# Patient Record
Sex: Female | Born: 1970
Health system: Southern US, Community
[De-identification: ages and names within clinical notes are randomized; demographics above are authoritative.]

## PROBLEM LIST (undated history)

## (undated) DIAGNOSIS — N938 Other specified abnormal uterine and vaginal bleeding: Secondary | ICD-10-CM

## (undated) DIAGNOSIS — IMO0002 Reserved for concepts with insufficient information to code with codable children: Secondary | ICD-10-CM

## (undated) DIAGNOSIS — Z8489 Family history of other specified conditions: Secondary | ICD-10-CM

## (undated) DIAGNOSIS — D649 Anemia, unspecified: Secondary | ICD-10-CM

## (undated) DIAGNOSIS — N200 Calculus of kidney: Secondary | ICD-10-CM

## (undated) HISTORY — PX: LITHOTRIPSY: SUR834

## (undated) HISTORY — DX: Calculus of kidney: N20.0

## (undated) HISTORY — DX: Other specified abnormal uterine and vaginal bleeding: N93.8

## (undated) HISTORY — DX: Reserved for concepts with insufficient information to code with codable children: IMO0002

---

## 1999-08-30 DIAGNOSIS — IMO0002 Reserved for concepts with insufficient information to code with codable children: Secondary | ICD-10-CM

## 1999-08-30 DIAGNOSIS — R87619 Unspecified abnormal cytological findings in specimens from cervix uteri: Secondary | ICD-10-CM

## 1999-08-30 HISTORY — DX: Unspecified abnormal cytological findings in specimens from cervix uteri: R87.619

## 1999-08-30 HISTORY — DX: Reserved for concepts with insufficient information to code with codable children: IMO0002

## 1999-10-15 ENCOUNTER — Other Ambulatory Visit: Admission: RE | Admit: 1999-10-15 | Discharge: 1999-10-15 | Payer: Self-pay | Admitting: Obstetrics & Gynecology

## 2000-03-16 ENCOUNTER — Ambulatory Visit (HOSPITAL_COMMUNITY): Admission: RE | Admit: 2000-03-16 | Discharge: 2000-03-16 | Payer: Self-pay | Admitting: Obstetrics and Gynecology

## 2000-04-05 ENCOUNTER — Other Ambulatory Visit: Admission: RE | Admit: 2000-04-05 | Discharge: 2000-04-05 | Payer: Self-pay | Admitting: Gynecology

## 2000-04-25 ENCOUNTER — Inpatient Hospital Stay (HOSPITAL_COMMUNITY): Admission: AD | Admit: 2000-04-25 | Discharge: 2000-04-30 | Payer: Self-pay | Admitting: Obstetrics and Gynecology

## 2000-04-25 ENCOUNTER — Encounter (INDEPENDENT_AMBULATORY_CARE_PROVIDER_SITE_OTHER): Payer: Self-pay | Admitting: Specialist

## 2000-04-27 ENCOUNTER — Encounter: Payer: Self-pay | Admitting: Obstetrics & Gynecology

## 2000-05-01 ENCOUNTER — Encounter: Admission: RE | Admit: 2000-05-01 | Discharge: 2000-07-30 | Payer: Self-pay | Admitting: Obstetrics and Gynecology

## 2000-05-29 ENCOUNTER — Other Ambulatory Visit: Admission: RE | Admit: 2000-05-29 | Discharge: 2000-05-29 | Payer: Self-pay | Admitting: Obstetrics & Gynecology

## 2000-07-31 ENCOUNTER — Encounter: Admission: RE | Admit: 2000-07-31 | Discharge: 2000-10-29 | Payer: Self-pay | Admitting: Obstetrics and Gynecology

## 2000-11-29 ENCOUNTER — Encounter: Admission: RE | Admit: 2000-11-29 | Discharge: 2000-12-26 | Payer: Self-pay | Admitting: Obstetrics and Gynecology

## 2005-11-01 ENCOUNTER — Other Ambulatory Visit: Admission: RE | Admit: 2005-11-01 | Discharge: 2005-11-01 | Payer: Self-pay | Admitting: *Deleted

## 2008-11-12 ENCOUNTER — Ambulatory Visit (HOSPITAL_COMMUNITY): Admission: RE | Admit: 2008-11-12 | Discharge: 2008-11-12 | Payer: Self-pay | Admitting: Emergency Medicine

## 2008-11-19 ENCOUNTER — Ambulatory Visit (HOSPITAL_COMMUNITY): Admission: RE | Admit: 2008-11-19 | Discharge: 2008-11-19 | Payer: Self-pay | Admitting: Urology

## 2009-07-01 ENCOUNTER — Emergency Department (HOSPITAL_COMMUNITY): Admission: EM | Admit: 2009-07-01 | Discharge: 2009-07-01 | Payer: Self-pay | Admitting: Emergency Medicine

## 2009-07-10 ENCOUNTER — Ambulatory Visit (HOSPITAL_COMMUNITY): Admission: RE | Admit: 2009-07-10 | Discharge: 2009-07-10 | Payer: Self-pay | Admitting: Urology

## 2010-12-01 LAB — URINALYSIS, ROUTINE W REFLEX MICROSCOPIC
Glucose, UA: NEGATIVE mg/dL
Hgb urine dipstick: NEGATIVE
Ketones, ur: 15 mg/dL — AB
Nitrite: NEGATIVE
Nitrite: NEGATIVE
Protein, ur: NEGATIVE mg/dL
Specific Gravity, Urine: 1.034 — ABNORMAL HIGH (ref 1.005–1.030)
Specific Gravity, Urine: 1.035 — ABNORMAL HIGH (ref 1.005–1.030)
Urobilinogen, UA: 1 mg/dL (ref 0.0–1.0)
pH: 5.5 (ref 5.0–8.0)
pH: 5.5 (ref 5.0–8.0)

## 2010-12-01 LAB — URINE CULTURE

## 2010-12-01 LAB — URINE MICROSCOPIC-ADD ON

## 2010-12-09 LAB — PREGNANCY, URINE: Preg Test, Ur: NEGATIVE

## 2011-01-11 NOTE — Op Note (Signed)
NAMESTEVIE, ERTLE NO.:  [id number]   MEDICAL RECORD NO.:  [id number]          PATIENT TYPE:  AMB   LOCATION:  DAY                          FACILITY:  [facility]   PHYSICIAN:  Heloise Purpura, MD      DATE OF BIRTH:  [birth year]   DATE OF PROCEDURE:  11/19/2008  DATE OF DISCHARGE:                               OPERATIVE REPORT   PREOPERATIVE DIAGNOSIS:  Left renal calculus.   POSTOPERATIVE DIAGNOSIS:  Left renal calculus.   PROCEDURES:  1. Cystoscopy.  2. Left retrograde pyelography.  3. Left ureteroscopy with laser lithotripsy.  4. Left ureteral stent placement.   SURGEON:  Dr. Heloise Purpura.   ANESTHESIA:  General.   COMPLICATIONS:  None.   INDICATIONS:  Ms. Persico is a 40 year old female who recently presented  with complaints of abdominal and back pain.  She was found to have a  large 1.7 cm calculus at the left ureteropelvic junction.  After  discussion regarding management options for treatment, she elected to  proceed with the above procedures.  The potential risks, complications,  and alternative treatment options were discussed in detail and informed  consent was obtained.   DESCRIPTION OF PROCEDURE:  The patient was taken to the operating room  and a general anesthetic was administered.  She was given preoperative  antibiotics, placed in the dorsal lithotomy position, and prepped and  draped in the usual sterile fashion.  Next, cystourethroscopy was  performed which demonstrated a normal urethra.  There was noted be  squamous metaplasia along the trigone of the bladder.  The ureteral  orifices were noted be in their normal anatomic position and effluxing  clear urine.  There was no evidence for any bladder tumors, stones, or  other mucosal pathology.  Attention then turned to the left ureteral  orifice which was cannulated with a 6-French ureteral catheter.  Omnipaque contrast was injected and the ureter appeared to be of normal  caliber without  evidence of filling defects or obvious calculi.  The  patient was noted to have a large calculus in the left renal pelvis  consistent with her known stone.  A 0.038 Sensor guidewire was then  advanced up into the left renal pelvis under fluoroscopic guidance.  A  12/14 ureteral access sheath was then advanced over the wire allowing  dilation of the distal ureter and placement of the ureteral access  sheath up in the proximal ureter.  The flexible ureteroscope was then  advanced up into the proximal ureter and the renal calculus was  identified.  The 200 micron fiber was then used to perform fragmentation  of the stone with the holmium laser.  Once the stone was adequately  fragmented, multiple fragments were removed with the engage basket.  The  guidewire was then replaced under direct vision through the ureteroscope  into the upper pole of the renal pelvis and the ureteral access sheath  was removed.  The wire was back loaded over the cystoscope and a 6 x 24  double-J ureteral stent was advanced over the wire using Seldinger  technique and positioned under fluoroscopic and  cystoscopic guidance.  The wire was removed with a good curl  noted in the renal pelvis, as well as in the bladder.  The bladder was  then emptied and the procedure was ended.  She tolerated the procedure  well without complications.  She was able to be extubated and  transferred to the recovery unit in satisfactory condition.      Heloise Purpura, MD  Electronically Signed     LB/MEDQ  D:  11/19/2008  T:  11/19/2008  Job:  [id number]

## 2012-04-15 ENCOUNTER — Emergency Department (HOSPITAL_COMMUNITY)
Admission: EM | Admit: 2012-04-15 | Discharge: 2012-04-15 | Disposition: A | Discharge status: Home / Self Care | Payer: Self-pay | Attending: Emergency Medicine | Admitting: Emergency Medicine

## 2012-04-15 ENCOUNTER — Emergency Department (HOSPITAL_COMMUNITY): Payer: Self-pay

## 2012-04-15 ENCOUNTER — Encounter (HOSPITAL_COMMUNITY): Payer: Self-pay | Admitting: *Deleted

## 2012-04-15 DIAGNOSIS — N200 Calculus of kidney: Secondary | ICD-10-CM | POA: Insufficient documentation

## 2012-04-15 DIAGNOSIS — R109 Unspecified abdominal pain: Secondary | ICD-10-CM | POA: Insufficient documentation

## 2012-04-15 DIAGNOSIS — F17200 Nicotine dependence, unspecified, uncomplicated: Secondary | ICD-10-CM | POA: Insufficient documentation

## 2012-04-15 DIAGNOSIS — Z87442 Personal history of urinary calculi: Secondary | ICD-10-CM | POA: Insufficient documentation

## 2012-04-15 DIAGNOSIS — R300 Dysuria: Secondary | ICD-10-CM | POA: Insufficient documentation

## 2012-04-15 DIAGNOSIS — N390 Urinary tract infection, site not specified: Secondary | ICD-10-CM | POA: Insufficient documentation

## 2012-04-15 LAB — URINE MICROSCOPIC-ADD ON

## 2012-04-15 LAB — URINALYSIS, ROUTINE W REFLEX MICROSCOPIC
Glucose, UA: NEGATIVE mg/dL
Hgb urine dipstick: NEGATIVE
Specific Gravity, Urine: 1.018 (ref 1.005–1.030)
pH: 6 (ref 5.0–8.0)

## 2012-04-15 LAB — GLUCOSE, CAPILLARY: Glucose-Capillary: 78 mg/dL (ref 70–99)

## 2012-04-15 LAB — POCT PREGNANCY, URINE: Preg Test, Ur: NEGATIVE

## 2012-04-15 MED ORDER — CIPROFLOXACIN HCL 500 MG PO TABS: 500.0000 mg | ORAL_TABLET | Freq: Two times a day (BID) | ORAL | Status: AC

## 2012-04-15 NOTE — ED Provider Notes (Signed)
History     CSN: [id number]  Arrival date & time 04/15/12  1210   First MD Initiated Contact with Patient 04/15/12 1401      Chief Complaint  Patient presents with  . Polyuria  . Dysuria  . Back Pain    (Consider location/radiation/quality/duration/timing/severity/associated sxs/prior treatment) Patient is a 41 y.o. female presenting with dysuria and back pain. The history is provided by the patient.  Dysuria   Back Pain  Associated symptoms include dysuria.   patient here complaining of a one-month history of right-sided flank pain with some dysuria. Denies any fever, vomiting, chills. Symptoms have been colicky. History of kidney stones in the past. No vaginal bleeding or discharge. Nothing makes her symptoms better or worse and no medications used prior to arrival  History reviewed. No pertinent past medical history.  History reviewed. No pertinent past surgical history.  History reviewed. No pertinent family history.  History  Substance Use Topics  . Smoking status: Current Everyday Smoker  . Smokeless tobacco: Not on file  . Alcohol Use: No    OB History    Grav Para Term Preterm Abortions TAB SAB Ect Mult Living                  Review of Systems  Genitourinary: Positive for dysuria.  Musculoskeletal: Positive for back pain.  All other systems reviewed and are negative.    Allergies  Review of patient's allergies indicates not on file.  Home Medications  No current outpatient prescriptions on file.  BP 132/90  Pulse 93  Temp 98.5 F (36.9 C) (Oral)  Resp 16  SpO2 99%  Physical Exam  Nursing note and vitals reviewed. Constitutional: She is oriented to person, place, and time. She appears well-developed and well-nourished.  Non-toxic appearance. No distress.  HENT:  Head: Normocephalic and atraumatic.  Eyes: Conjunctivae, EOM and lids are normal. Pupils are equal, round, and reactive to light.  Neck: Normal range of motion. Neck supple. No  tracheal deviation present. No mass present.  Cardiovascular: Normal rate, regular rhythm and normal heart sounds.  Exam reveals no gallop.   No murmur heard. Pulmonary/Chest: Effort normal and breath sounds normal. No stridor. No respiratory distress. She has no decreased breath sounds. She has no wheezes. She has no rhonchi. She has no rales.  Abdominal: Soft. Normal appearance and bowel sounds are normal. She exhibits no distension. There is no tenderness. There is no rigidity, no rebound, no guarding and no CVA tenderness.  Musculoskeletal: Normal range of motion. She exhibits no edema and no tenderness.  Neurological: She is alert and oriented to person, place, and time. She has normal strength. No cranial nerve deficit or sensory deficit. GCS eye subscore is 4. GCS verbal subscore is 5. GCS motor subscore is 6.  Skin: Skin is warm and dry. No abrasion and no rash noted.  Psychiatric: She has a normal mood and affect. Her speech is normal and behavior is normal.    ED Course  Procedures (including critical care time)  Labs Reviewed  URINALYSIS, ROUTINE W REFLEX MICROSCOPIC - Abnormal; Notable for the following:    APPearance CLOUDY (*)     Nitrite POSITIVE (*)     Leukocytes, UA MODERATE (*)     All other components within normal limits  URINE MICROSCOPIC-ADD ON - Abnormal; Notable for the following:    Squamous Epithelial / LPF FEW (*)     Bacteria, UA MANY (*)     All other components  within normal limits  POCT PREGNANCY, URINE   No results found.   No diagnosis found.    MDM  Patient's CAT scan shows intrarenal stones without ureteral stones. Patient be treated for her UTI        Toy Baker, MD 04/15/12 1510

## 2012-04-15 NOTE — ED Notes (Signed)
Pt's CBG was 78 when I checked it 2:14 pm JG.

## 2012-04-15 NOTE — ED Notes (Signed)
Pt reports month hx of lower back pain, polyuria, cloudy urine and dysuria.

## 2012-12-30 ENCOUNTER — Emergency Department (HOSPITAL_COMMUNITY)
Admission: EM | Admit: 2012-12-30 | Discharge: 2012-12-30 | Disposition: A | Discharge status: Home / Self Care | Payer: Medicaid Other | Attending: Emergency Medicine | Admitting: Emergency Medicine

## 2012-12-30 ENCOUNTER — Encounter (HOSPITAL_COMMUNITY): Payer: Self-pay | Admitting: Emergency Medicine

## 2012-12-30 DIAGNOSIS — N939 Abnormal uterine and vaginal bleeding, unspecified: Secondary | ICD-10-CM

## 2012-12-30 DIAGNOSIS — R109 Unspecified abdominal pain: Secondary | ICD-10-CM | POA: Insufficient documentation

## 2012-12-30 DIAGNOSIS — Z3202 Encounter for pregnancy test, result negative: Secondary | ICD-10-CM | POA: Insufficient documentation

## 2012-12-30 DIAGNOSIS — F17200 Nicotine dependence, unspecified, uncomplicated: Secondary | ICD-10-CM | POA: Insufficient documentation

## 2012-12-30 DIAGNOSIS — N898 Other specified noninflammatory disorders of vagina: Secondary | ICD-10-CM | POA: Insufficient documentation

## 2012-12-30 DIAGNOSIS — N390 Urinary tract infection, site not specified: Secondary | ICD-10-CM | POA: Insufficient documentation

## 2012-12-30 LAB — URINALYSIS, ROUTINE W REFLEX MICROSCOPIC
Ketones, ur: NEGATIVE mg/dL
Protein, ur: NEGATIVE mg/dL
Urobilinogen, UA: 0.2 mg/dL (ref 0.0–1.0)

## 2012-12-30 LAB — BASIC METABOLIC PANEL
CO2: 26 mEq/L (ref 19–32)
GFR calc non Af Amer: 90 mL/min — ABNORMAL LOW (ref >90)
Glucose, Bld: 86 mg/dL (ref 70–99)
Potassium: 4.2 mEq/L (ref 3.5–5.1)
Sodium: 139 mEq/L (ref 135–145)

## 2012-12-30 LAB — URINE MICROSCOPIC-ADD ON

## 2012-12-30 LAB — PREGNANCY, URINE: Preg Test, Ur: NEGATIVE

## 2012-12-30 LAB — WET PREP, GENITAL

## 2012-12-30 LAB — CBC
MCHC: 35.8 g/dL (ref 30.0–36.0)
RDW: 13 % (ref 11.5–15.5)

## 2012-12-30 MED ORDER — MEDROXYPROGESTERONE ACETATE 10 MG PO TABS: 10.0000 mg | ORAL_TABLET | Freq: Every day | ORAL | Status: DC

## 2012-12-30 MED ORDER — SODIUM CHLORIDE 0.9 % IV BOLUS (SEPSIS)
1000.0000 mL | Freq: Once | INTRAVENOUS | Status: AC
  Administered 2012-12-30: 1000 mL via INTRAVENOUS

## 2012-12-30 MED ORDER — SULFAMETHOXAZOLE-TRIMETHOPRIM 800-160 MG PO TABS: 1.0000 | ORAL_TABLET | Freq: Two times a day (BID) | ORAL | Status: AC

## 2012-12-30 NOTE — ED Notes (Signed)
Pt dc'd home w/all belongings, alert and ambulatory upon dc, pt verbalizes understanding of dc instructions, 2 new rx prescribed

## 2012-12-30 NOTE — ED Provider Notes (Signed)
History     CSN: [id number]  Arrival date & time 12/30/12  1137   First MD Initiated Contact with Patient 12/30/12 1203      Chief Complaint  Patient presents with  . Vaginal Bleeding    (Consider location/radiation/quality/duration/timing/severity/associated sxs/prior treatment) Patient is a 42 y.o. female presenting with vaginal bleeding.  Vaginal Bleeding   Pt reports persistent vaginal bleeding for the last 7 weeks, heavy at times with clots. Minimal associated lower abdominal cramping, does not think she is pregnant. She has had general weakness and occasional lightheadedness. She denies any vomiting or fever. No dysuria. She has not had regular Gyn follow up since 2001 when she had an abnormal pap smear during pregnancy. Did not get follow up for that.   History reviewed. No pertinent past medical history.  History reviewed. No pertinent past surgical history.  History reviewed. No pertinent family history.  History  Substance Use Topics  . Smoking status: Current Every Day Smoker  . Smokeless tobacco: Not on file  . Alcohol Use: No    OB History   Grav Para Term Preterm Abortions TAB SAB Ect Mult Living                  Review of Systems  Genitourinary: Positive for vaginal bleeding.   All other systems reviewed and are negative except as noted in HPI.   Allergies  Review of patient's allergies indicates no known allergies.  Home Medications   Current Outpatient Rx  Name  Route  Sig  Dispense  Refill  . ibuprofen (ADVIL,MOTRIN) 200 MG tablet   Oral   Take 200 mg by mouth every 6 (six) hours as needed. For pain           BP 145/98  Pulse 89  Temp(Src) 97.6 F (36.4 C) (Oral)  Resp 18  SpO2 97%  Physical Exam  Nursing note and vitals reviewed. Constitutional: She is oriented to person, place, and time. She appears well-developed and well-nourished.  HENT:  Head: Normocephalic and atraumatic.  Eyes: EOM are normal. Pupils are equal, round,  and reactive to light.  Neck: Normal range of motion. Neck supple.  Cardiovascular: Normal rate, normal heart sounds and intact distal pulses.   Pulmonary/Chest: Effort normal and breath sounds normal.  Abdominal: Bowel sounds are normal. She exhibits no distension. There is tenderness (bilateral lower abdominal pain). There is no rebound and no guarding.  Genitourinary: Cervix exhibits no motion tenderness, no discharge and no friability. Right adnexum displays tenderness. Right adnexum displays no mass. Left adnexum displays tenderness. Left adnexum displays no mass. There is bleeding around the vagina. No vaginal discharge found.  Musculoskeletal: Normal range of motion. She exhibits no edema and no tenderness.  Neurological: She is alert and oriented to person, place, and time. She has normal strength. No cranial nerve deficit or sensory deficit.  Skin: Skin is warm and dry. No rash noted.  Psychiatric: She has a normal mood and affect.    ED Course  Procedures (including critical care time)  Labs Reviewed  WET PREP, GENITAL - Abnormal; Notable for the following:    WBC, Wet Prep HPF POC FEW (*)    All other components within normal limits  CBC - Abnormal; Notable for the following:    Hemoglobin 15.7 (*)    MCH 34.1 (*)    All other components within normal limits  BASIC METABOLIC PANEL - Abnormal; Notable for the following:    GFR calc non Af  Amer 90 (*)    All other components within normal limits  GC/CHLAMYDIA PROBE AMP  PREGNANCY, URINE  URINALYSIS, ROUTINE W REFLEX MICROSCOPIC   No results found.   1. Abnormal vaginal bleeding       MDM  Preg negative, no anemia, vitals are normal. Will start Provera for DUB, advised follow up with Gyn at [facility] for further evaluation.        Shamariah Shewmake B. Bernette Mayers, MD 12/30/12 1335

## 2012-12-30 NOTE — ED Notes (Signed)
Pt sts vaginal bleeding x 2 months; pt denies pain

## 2012-12-30 NOTE — ED Notes (Signed)
Pt reports vaginal bleeding x2 months, pt states "some days are light and then some days I have heavy blood clots." Pt also c/o weakness, dizziness, and "rapid" weight gain. Pt weighed approx 120 lbs last Oct/Nov and now weighs 170 lbs. Pt is unable to follow up with an OBGYN d.t no insurance.

## 2012-12-31 LAB — GC/CHLAMYDIA PROBE AMP: GC Probe RNA: NEGATIVE

## 2013-01-01 LAB — URINE CULTURE: Colony Count: >=100000

## 2013-01-02 NOTE — ED Notes (Signed)
Post ED Visit - Positive Culture Follow-up  Culture report reviewed by antimicrobial stewardship pharmacist: []  Wes Dulaney, Pharm.D., BCPS []  Celedonio Miyamoto, Pharm.D., BCPS []  Georgina Pillion, [address].D., BCPS []  Fonda, [address].D., BCPS, AAHIVP []  Estella Husk, Pharm.D., BCPS, AAHIV  Positive urine-culture Treated with Bactrim-sensitive to the same and no further patient follow-up is required at this time.  Larena Sox 01/02/2013, 4:37 PM

## 2013-01-07 ENCOUNTER — Encounter: Payer: Self-pay | Admitting: Obstetrics & Gynecology

## 2013-01-07 ENCOUNTER — Ambulatory Visit (INDEPENDENT_AMBULATORY_CARE_PROVIDER_SITE_OTHER): Payer: Self-pay | Admitting: Obstetrics & Gynecology

## 2013-01-07 VITALS — BP 132/87 | HR 81 | Temp 98.7°F | Ht 64.0 in | Wt 177.0 lb

## 2013-01-07 DIAGNOSIS — N938 Other specified abnormal uterine and vaginal bleeding: Secondary | ICD-10-CM | POA: Insufficient documentation

## 2013-01-07 DIAGNOSIS — N949 Unspecified condition associated with female genital organs and menstrual cycle: Secondary | ICD-10-CM

## 2013-01-07 MED ORDER — MEDROXYPROGESTERONE ACETATE 10 MG PO TABS: ORAL_TABLET | ORAL | Status: DC

## 2013-01-07 NOTE — Progress Notes (Signed)
  Subjective:    Patient ID: Brittany Herrera, female    DOB: September 23, 1970, 42 y.o.   MRN: [id number]  HPI [id number] Patient's last menstrual period was 12/03/2012. She has bled for 8 weeks was seen in ED and took provera 5 days, still bleeding. Regular menses until this. No pain, fever, d/c.  Past Medical History  Diagnosis Date  . Abnormal Pap smear 2001    [place] [facility]- told her that she had "pods" didnt get tx  . Kidney stones 2011, 2012   History reviewed. No pertinent past surgical history. History   Social History  . Marital Status: Legally Separated    Spouse Name: N/A    Number of Children: N/A  . Years of Education: N/A   Occupational History  . Not on file.   Social History Main Topics  . Smoking status: Current Every Day Smoker  . Smokeless tobacco: Never Used  . Alcohol Use: No  . Drug Use: No  . Sexually Active: Yes   Other Topics Concern  . Not on file   Social History Narrative  . No narrative on file   No Known Allergies Current Outpatient Prescriptions on File Prior to Visit  Medication Sig Dispense Refill  . ibuprofen (ADVIL,MOTRIN) 200 MG tablet Take 200 mg by mouth every 6 (six) hours as needed. For pain       No current facility-administered medications on file prior to visit.     Review of Systems  Constitutional: Negative for fever.  Genitourinary: Positive for vaginal bleeding and menstrual problem. Negative for vaginal discharge.       Objective:   Physical Exam  Constitutional: She appears well-nourished. No distress.  Abdominal: Soft. She exhibits no mass. There is no tenderness.  Genitourinary: Vagina normal and uterus normal.  Dark blood, no lesion, pap done, no mass  Skin: Skin is warm and dry. No pallor.  Psychiatric: She has a normal mood and affect. Her behavior is normal.          Assessment & Plan:  DUB  Pelvic US RTC 3 weeks Provera 20mg  until no bleeding then 10 mg daily  Adam Phenix,  MD 01/07/2013

## 2013-01-07 NOTE — Patient Instructions (Signed)
Dysfunctional Uterine Bleeding  Normally, menstrual periods begin between ages 11 to 17 in young women. A normal menstrual cycle/period may begin every 23 days up to 35 days and lasts from 1 to 7 days. Around 12 to 14 days before your menstrual period starts, ovulation (ovary produces an egg) occurs. When counting the time between menstrual periods, count from the first day of bleeding of the previous period to the first day of bleeding of the next period.  Dysfunctional (abnormal) uterine bleeding is bleeding that is different from a normal menstrual period. Your periods may come earlier or later than usual. They may be lighter, have blood clots or be heavier. You may have bleeding between periods, or you may skip one period or more. You may have bleeding after sexual intercourse, bleeding after menopause, or no menstrual period.  CAUSES   · Pregnancy (normal, miscarriage, tubal).  · IUDs (intrauterine device, birth control).  · Birth control pills.  · Hormone treatment.  · Menopause.  · Infection of the cervix.  · Blood clotting problems.  · Infection of the inside lining of the uterus.  · Endometriosis, inside lining of the uterus growing in the pelvis and other female organs.  · Adhesions (scar tissue) inside the uterus.  · Obesity or severe weight loss.  · Uterine polyps inside the uterus.  · Cancer of the vagina, cervix, or uterus.  · Ovarian cysts or polycystic ovary syndrome.  · Medical problems (diabetes, thyroid disease).  · Uterine fibroids (noncancerous tumor).  · Problems with your female hormones.  · Endometrial hyperplasia, very thick lining and enlarged cells inside of the uterus.  · Medicines that interfere with ovulation.  · Radiation to the pelvis or abdomen.  · Chemotherapy.  DIAGNOSIS   · Your doctor will discuss the history of your menstrual periods, medicines you are taking, changes in your weight, stress in your life, and any medical problems you may have.  · Your doctor will do a physical  and pelvic examination.  · Your doctor may want to perform certain tests to make a diagnosis, such as:  · Pap test.  · Blood tests.  · Cultures for infection.  · CT scan.  · Ultrasound.  · Hysteroscopy.  · Laparoscopy.  · MRI.  · Hysterosalpingography.  · D and C.  · Endometrial biopsy.  TREATMENT   Treatment will depend on the cause of the dysfunctional uterine bleeding (DUB). Treatment may include:  · Observing your menstrual periods for a couple of months.  · Prescribing medicines for medical problems, including:  · Antibiotics.  · Hormones.  · Birth control pills.  · Removing an IUD (intrauterine device, birth control).  · Surgery:  · D and C (scrape and remove tissue from inside the uterus).  · Laparoscopy (examine inside the abdomen with a lighted tube).  · Uterine ablation (destroy lining of the uterus with electrical current, laser, heat, or freezing).  · Hysteroscopy (examine cervix and uterus with a lighted tube).  · Hysterectomy (remove the uterus).  HOME CARE INSTRUCTIONS   · If medicines were prescribed, take exactly as directed. Do not change or switch medicines without consulting your caregiver.  · Long term heavy bleeding may result in iron deficiency. Your caregiver may have prescribed iron pills. They help replace the iron that your body lost from heavy bleeding. Take exactly as directed.  · Do not take aspirin or medicines that contain aspirin one week before or during your menstrual period. Aspirin may make   the bleeding worse.  · If you need to change your sanitary pad or tampon more than once every 2 hours, stay in bed with your feet elevated and a cold pack on your lower abdomen. Rest as much as possible, until the bleeding stops or slows down.  · Eat well-balanced meals. Eat foods high in iron. Examples are:  · Leafy green vegetables.  · Whole-grain breads and cereals.  · Eggs.  · Meat.  · Liver.  · Do not try to lose weight until the abnormal bleeding has stopped and your blood iron level is  back to normal. Do not lift more than ten pounds or do strenuous activities when you are bleeding.  · For a couple of months, make note on your calendar, marking the start and ending of your period, and the type of bleeding (light, medium, heavy, spotting, clots or missed periods). This is for your caregiver to better evaluate your problem.  SEEK MEDICAL CARE IF:   · You develop nausea (feeling sick to your stomach) and vomiting, dizziness, or diarrhea while you are taking your medicine.  · You are getting lightheaded or weak.  · You have any problems that may be related to the medicine you are taking.  · You develop pain with your DUB.  · You want to remove your IUD.  · You want to stop or change your birth control pills or hormones.  · You have any type of abnormal bleeding mentioned above.  · You are over 16 years old and have not had a menstrual period yet.  · You are 42 years old and you are still having menstrual periods.  · You have any of the symptoms mentioned above.  · You develop a rash.  SEEK IMMEDIATE MEDICAL CARE IF:   · An oral temperature above 102° F (38.9° C) develops.  · You develop chills.  · You are changing your sanitary pad or tampon more than once an hour.  · You develop abdominal pain.  · You pass out or faint.  Document Released: 08/12/2000 Document Revised: 11/07/2011 Document Reviewed: 07/14/2009  [employer]® Patient Information ©2013 [employer].

## 2013-01-11 ENCOUNTER — Ambulatory Visit (HOSPITAL_COMMUNITY)
Admission: RE | Admit: 2013-01-11 | Discharge: 2013-01-11 | Disposition: A | Discharge status: Home / Self Care | Payer: Medicaid Other | Source: Ambulatory Visit | Attending: Obstetrics & Gynecology | Admitting: Obstetrics & Gynecology

## 2013-01-11 DIAGNOSIS — N949 Unspecified condition associated with female genital organs and menstrual cycle: Secondary | ICD-10-CM | POA: Insufficient documentation

## 2013-01-11 DIAGNOSIS — N938 Other specified abnormal uterine and vaginal bleeding: Secondary | ICD-10-CM | POA: Insufficient documentation

## 2013-02-04 ENCOUNTER — Ambulatory Visit: Payer: Self-pay | Admitting: Obstetrics & Gynecology

## 2013-03-21 ENCOUNTER — Ambulatory Visit (INDEPENDENT_AMBULATORY_CARE_PROVIDER_SITE_OTHER): Payer: Medicaid Other | Admitting: Obstetrics & Gynecology

## 2013-03-21 ENCOUNTER — Encounter: Payer: Self-pay | Admitting: Obstetrics & Gynecology

## 2013-03-21 VITALS — BP 125/80 | HR 77 | Temp 97.5°F | Ht 64.0 in | Wt 174.9 lb

## 2013-03-21 DIAGNOSIS — N949 Unspecified condition associated with female genital organs and menstrual cycle: Secondary | ICD-10-CM

## 2013-03-21 DIAGNOSIS — F411 Generalized anxiety disorder: Secondary | ICD-10-CM

## 2013-03-21 DIAGNOSIS — F419 Anxiety disorder, unspecified: Secondary | ICD-10-CM | POA: Insufficient documentation

## 2013-03-21 DIAGNOSIS — N938 Other specified abnormal uterine and vaginal bleeding: Secondary | ICD-10-CM

## 2013-03-21 MED ORDER — BUPROPION HCL ER (SR) 150 MG PO TB12: 150.0000 mg | ORAL_TABLET | Freq: Two times a day (BID) | ORAL | Status: DC

## 2013-03-21 MED ORDER — MEDROXYPROGESTERONE ACETATE 10 MG PO TABS: ORAL_TABLET | ORAL | Status: DC

## 2013-03-21 NOTE — Patient Instructions (Signed)
Smoking Cessation, Tips for Success  YOU CAN QUIT SMOKING  If you are ready to quit smoking, congratulations! You have chosen to help yourself be healthier. Cigarettes bring nicotine, tar, carbon monoxide, and other irritants into your body. Your lungs, heart, and blood vessels will be able to work better without these poisons. There are many different ways to quit smoking. Nicotine gum, nicotine patches, a nicotine inhaler, or nicotine nasal spray can help with physical craving. Hypnosis, support groups, and medicines help break the habit of smoking. Here are some tips to help you quit for good.   Throw away all cigarettes.   Clean and remove all ashtrays from your home, work, and car.   On a card, write down your reasons for quitting. Carry the card with you and read it when you get the urge to smoke.   Cleanse your body of nicotine. Drink enough water and fluids to keep your urine clear or pale yellow. Do this after quitting to flush the nicotine from your body.   Learn to predict your moods. Do not let a bad situation be your excuse to have a cigarette. Some situations in your life might tempt you into wanting a cigarette.   Never have "just one" cigarette. It leads to wanting another and another. Remind yourself of your decision to quit.   Change habits associated with smoking. If you smoked while driving or when feeling stressed, try other activities to replace smoking. Stand up when drinking your coffee. Brush your teeth after eating. Sit in a different chair when you read the paper. Avoid alcohol while trying to quit, and try to drink fewer caffeinated beverages. Alcohol and caffeine may urge you to smoke.   Avoid foods and drinks that can trigger a desire to smoke, such as sugary or spicy foods and alcohol.   Ask people who smoke not to smoke around you.    Have something planned to do right after eating or having a cup of coffee. Take a walk or exercise to perk you up. This will help to keep you from overeating.   Try a relaxation exercise to calm you down and decrease your stress. Remember, you may be tense and nervous for the first 2 weeks after you quit, but this will pass.   Find new activities to keep your hands busy. Play with a pen, coin, or rubber band. Doodle or draw things on paper.   Brush your teeth right after eating. This will help cut down on the craving for the taste of tobacco after meals. You can try mouthwash, too.   Use oral substitutes, such as lemon drops, carrots, a cinnamon stick, or chewing gum, in place of cigarettes. Keep them handy so they are available when you have the urge to smoke.   When you have the urge to smoke, try deep breathing.   Designate your home as a nonsmoking area.   If you are a heavy smoker, ask your caregiver about a prescription for nicotine chewing gum. It can ease your withdrawal from nicotine.   Reward yourself. Set aside the cigarette money you save and buy yourself something nice.   Look for support from others. Join a support group or smoking cessation program. Ask someone at home or at work to help you with your plan to quit smoking.   Always ask yourself, "Do I need this cigarette or is this just a reflex?" Tell yourself, "Today, I choose not to smoke," or "I do not want to   smoke." You are reminding yourself of your decision to quit, even if you do smoke a cigarette.  HOW WILL I FEEL WHEN I QUIT SMOKING?   The benefits of not smoking start within days of quitting.   You may have symptoms of withdrawal because your body is used to nicotine (the addictive substance in cigarettes). You may crave cigarettes, be irritable, feel very hungry, cough often, get headaches, or have difficulty concentrating.    The withdrawal symptoms are only temporary. They are strongest when you first quit but will go away within 10 to 14 days.   When withdrawal symptoms occur, stay in control. Think about your reasons for quitting. Remind yourself that these are signs that your body is healing and getting used to being without cigarettes.   Remember that withdrawal symptoms are easier to treat than the major diseases that smoking can cause.   Even after the withdrawal is over, expect periodic urges to smoke. However, these cravings are generally short-lived and will go away whether you smoke or not. Do not smoke!   If you relapse and smoke again, do not lose hope. Most smokers quit 3 times before they are successful.   If you relapse, do not give up! Plan ahead and think about what you will do the next time you get the urge to smoke.  LIFE AS A NONSMOKER: MAKE IT FOR A MONTH, MAKE IT FOR LIFE  Day 1: Hang this page where you will see it every day.  Day 2: Get rid of all ashtrays, matches, and lighters.  Day 3: Drink water. Breathe deeply between sips.  Day 4: Avoid places with smoke-filled air, such as bars, clubs, or the smoking section of restaurants.  Day 5: Keep track of how much money you save by not smoking.  Day 6: Avoid boredom. Keep a good book with you or go to the movies.  Day 7: Reward yourself! One week without smoking!  Day 8: Make a dental appointment to get your teeth cleaned.  Day 9: Decide how you will turn down a cigarette before it is offered to you.  Day 10: Review your reasons for quitting.  Day 11: Distract yourself. Stay active to keep your mind off smoking and to relieve tension. Take a walk, exercise, read a book, do a crossword puzzle, or try a new hobby.  Day 12: Exercise. Get off the bus before your stop or use stairs instead of escalators.  Day 13: Call on friends for support and encouragement.  Day 14: Reward yourself! Two weeks without smoking!  Day 15: Practice deep breathing exercises.   Day 16: Bet a friend that you can stay a nonsmoker.  Day 17: Ask to sit in nonsmoking sections of restaurants.  Day 18: Hang up "No Smoking" signs.  Day 19: Think of yourself as a nonsmoker.  Day 20: Each morning, tell yourself you will not smoke.  Day 21: Reward yourself! Three weeks without smoking!  Day 22: Think of smoking in negative ways. Remember how it stains your teeth, gives you bad breath, and leaves you short of breath.  Day 23: Eat a nutritious breakfast.  Day 24:Do not relive your days as a smoker.  Day 25: Hold a pencil in your hand when talking on the telephone.  Day 26: Tell all your friends you do not smoke.  Day 27: Think about how much better food tastes.  Day 28: Remember, one cigarette is one too many.  Day 29: Take up   a hobby that will keep your hands busy.  Day 30: Congratulations! One month without smoking! Give yourself a big reward.  Your caregiver can direct you to community resources or hospitals for support, which may include:   Group support.   Education.   Hypnosis.   Subliminal therapy.  Document Released: 05/13/2004 Document Revised: 11/07/2011 Document Reviewed: 06/01/2009  Exit[employer] Patient Information 2014 [employer]  Smoking Cessation  Quitting smoking is important to your health and has many advantages. However, it is not always easy to quit since nicotine is a very addictive drug. Often times, people try 3 times or more before being able to quit. This document explains the best ways for you to prepare to quit smoking. Quitting takes hard work and a lot of effort, but you can do it.  ADVANTAGES OF QUITTING SMOKING   You will live longer, feel better, and live better.   Your body will feel the impact of quitting smoking almost immediately.   Within 20 minutes, blood pressure decreases. Your pulse returns to its normal level.   After 8 hours, carbon monoxide levels in the blood return to normal. Your oxygen level increases.    After 24 hours, the chance of having a heart attack starts to decrease. Your breath, hair, and body stop smelling like smoke.   After 48 hours, damaged nerve endings begin to recover. Your sense of taste and smell improve.   After 72 hours, the body is virtually free of nicotine. Your bronchial tubes relax and breathing becomes easier.   After 2 to 12 weeks, lungs can hold more air. Exercise becomes easier and circulation improves.   The risk of having a heart attack, stroke, cancer, or lung disease is greatly reduced.   After 1 year, the risk of coronary heart disease is cut in half.   After 5 years, the risk of stroke falls to the same as a nonsmoker.   After 10 years, the risk of lung cancer is cut in half and the risk of other cancers decreases significantly.   After 15 years, the risk of coronary heart disease drops, usually to the level of a nonsmoker.   If you are pregnant, quitting smoking will improve your chances of having a healthy baby.   The people you live with, especially any children, will be healthier.   You will have extra money to spend on things other than cigarettes.  QUESTIONS TO THINK ABOUT BEFORE ATTEMPTING TO QUIT  You may want to talk about your answers with your caregiver.   Why do you want to quit?   If you tried to quit in the past, what helped and what did not?   What will be the most difficult situations for you after you quit? How will you plan to handle them?   Who can help you through the tough times? Your family? Friends? A caregiver?   What pleasures do you get from smoking? What ways can you still get pleasure if you quit?  Here are some questions to ask your caregiver:   How can you help me to be successful at quitting?   What medicine do you think would be best for me and how should I take it?   What should I do if I need more help?   What is smoking withdrawal like? How can I get information on withdrawal?  GET READY   Set a quit date.    Change your environment by getting rid of all cigarettes, ashtrays,   matches, and lighters in your home, car, or work. Do not let people smoke in your home.   Review your past attempts to quit. Think about what worked and what did not.  GET SUPPORT AND ENCOURAGEMENT  You have a better chance of being successful if you have help. You can get support in many ways.   Tell your family, friends, and co-workers that you are going to quit and need their support. Ask them not to smoke around you.   Get individual, group, or telephone counseling and support. Programs are available at local hospitals and health centers. Call your local health department for information about programs in your area.   Spiritual beliefs and practices may help some smokers quit.   Download a "quit meter" on your computer to keep track of quit statistics, such as how long you have gone without smoking, cigarettes not smoked, and money saved.   Get a self-help book about quitting smoking and staying off of tobacco.  LEARN NEW SKILLS AND BEHAVIORS   Distract yourself from urges to smoke. Talk to someone, go for a walk, or occupy your time with a task.   Change your normal routine. Take a different route to work. Drink tea instead of coffee. Eat breakfast in a different place.   Reduce your stress. Take a hot bath, exercise, or read a book.   Plan something enjoyable to do every day. Reward yourself for not smoking.   Explore interactive web-based programs that specialize in helping you quit.  GET MEDICINE AND USE IT CORRECTLY  Medicines can help you stop smoking and decrease the urge to smoke. Combining medicine with the above behavioral methods and support can greatly increase your chances of successfully quitting smoking.    Nicotine replacement therapy helps deliver nicotine to your body without the negative effects and risks of smoking. Nicotine replacement therapy includes nicotine gum, lozenges, inhalers, nasal sprays, and skin patches. Some may be available over-the-counter and others require a prescription.   Antidepressant medicine helps people abstain from smoking, but how this works is unknown. This medicine is available by prescription.   Nicotinic receptor partial agonist medicine simulates the effect of nicotine in your brain. This medicine is available by prescription.  Ask your caregiver for advice about which medicines to use and how to use them based on your health history. Your caregiver will tell you what side effects to look out for if you choose to be on a medicine or therapy. Carefully read the information on the package. Do not use any other product containing nicotine while using a nicotine replacement product.   RELAPSE OR DIFFICULT SITUATIONS  Most relapses occur within the first 3 months after quitting. Do not be discouraged if you start smoking again. Remember, most people try several times before finally quitting. You may have symptoms of withdrawal because your body is used to nicotine. You may crave cigarettes, be irritable, feel very hungry, cough often, get headaches, or have difficulty concentrating. The withdrawal symptoms are only temporary. They are strongest when you first quit, but they will go away within 10 14 days.  To reduce the chances of relapse, try to:   Avoid drinking alcohol. Drinking lowers your chances of successfully quitting.   Reduce the amount of caffeine you consume. Once you quit smoking, the amount of caffeine in your body increases and can give you symptoms, such as a rapid heartbeat, sweating, and anxiety.   Avoid smokers because they can make you want to

## 2013-03-21 NOTE — Progress Notes (Signed)
States never took provera . Is still having bleeding everyday- somedays light, somedays heavy.

## 2013-03-21 NOTE — Progress Notes (Signed)
Patient ID: Brittany Herrera, female   DOB: [birth year], 42 y.o.   MRN: [id number]  Chief Complaint  Patient presents with  . Follow-up    dub    HPI Brittany Herrera is a 42 y.o. female.  Still bleeding, did not take provera but will try now. Wants to quit smoking, has anxiety issues.  HPI  Past Medical History  Diagnosis Date  . Abnormal Pap smear 2001    [facility]- told her that she had "pods" didnt get tx  . Kidney stones 2011, 2012  . DUB (dysfunctional uterine bleeding)     History reviewed. No pertinent past surgical history.  Family History  Problem Relation Age of Onset  . Cancer Mother     breast  . Cancer Maternal Grandmother     breast, colon  . Diabetes Paternal Grandmother     Social History History  Substance Use Topics  . Smoking status: Current Every Day Smoker -- 0.50 packs/day    Types: Cigarettes  . Smokeless tobacco: Never Used  . Alcohol Use: No    No Known Allergies  Current Outpatient Prescriptions  Medication Sig Dispense Refill  . ibuprofen (ADVIL,MOTRIN) 200 MG tablet Take 200 mg by mouth every 6 (six) hours as needed. For pain      . buPROPion (WELLBUTRIN SR) 150 MG 12 hr tablet Take 1 tablet (150 mg total) by mouth 2 (two) times daily.  60 tablet  4  . medroxyPROGESTERone (PROVERA) 10 MG tablet 2 by mouth daily until no bleeding then 1 a day  30 tablet  1   No current facility-administered medications for this visit.    Review of Systems Review of Systems  Genitourinary: Positive for vaginal bleeding and menstrual problem. Negative for vaginal discharge and pelvic pain.    Blood pressure 125/80, pulse 77, temperature 97.5 F (36.4 C), height 5\' 4"  (1.626 m), weight 174 lb 14.4 oz (79.334 kg), last menstrual period 12/03/2012.  Physical Exam Physical Exam  Constitutional: No distress.  Genitourinary:  deferred  Psychiatric: She has a normal mood and affect. Her behavior is normal.    Data Reviewed  *RADIOLOGY  REPORT*  Clinical Data: Dysfunctional uterine bleeding. Prescribed Provera  on 01/09/2013. LMP 11/05/2012 with continuous bleeding since  TRANSABDOMINAL AND TRANSVAGINAL ULTRASOUND OF PELVIS  Technique: Both transabdominal and transvaginal ultrasound  examinations of the pelvis were performed. Transabdominal technique  was performed for global imaging of the pelvis including uterus,  ovaries, adnexal regions, and pelvic cul-de-sac.  It was necessary to proceed with endovaginal exam following the  transabdominal exam to visualize the myometrium, endometrium and  adnexa.  Comparison: None  Findings:  Uterus: Is anteverted and anteflexed and demonstrates a sagittal  length of 9.4 cm, depth of 4.9 cm and width of 6.2 cm. A  homogeneous myometrium is seen  Endometrium: Appears homogeneously echogenic with a width of 7 mm.  No areas of focal thickening or heterogeneity are seen  Right ovary: Measures 3.5 x 3.5 x 3.4 cm and contains a dominant  follicle  Left ovary: Measures 3.5 x 2.2 x 2.7 cm and has a normal appearance  Other findings: No pelvic fluid or separate adnexal masses are  seen.  IMPRESSION:  Normal pelvic ultrasound.  Original Report Authenticated By: Rhodia Albright, M.D. DUB, normal [place]  Assessment    Need progesterone therapy     Plan    Provera as Rx. Quit smoking. Wellbutrin 150 SR BID, RTC 6 months  ARNOLD,JAMES 03/21/2013, 3:26 PM

## 2013-03-22 MED ORDER — MEDROXYPROGESTERONE ACETATE 10 MG PO TABS: ORAL_TABLET | ORAL | Status: DC

## 2013-03-22 NOTE — Addendum Note (Signed)
Addended by: Jill Side on: 03/22/2013 08:14 AM   Modules accepted: Orders

## 2013-04-09 ENCOUNTER — Telehealth: Payer: Self-pay

## 2013-04-09 NOTE — Telephone Encounter (Signed)
Pt called and stated that the medication she was given to stop bleeding is not working and she is having increased bleeding and clotting.  Called pt and I asked pt how she was taking the medication.  Pt informed me that she was taking 2 a day then 1.  I informed her that Dr. Debroah Loop wanted her to take twice a day until bleeding stops then once a day.  Pt stated that she is still taking twice a day that has emptied the bottle and needs a refill.  I advised pt to continue to take medication twice a day until period stops then once a day then on her appt scheduled on 04/25/13 she can discuss further management with provider.  Pt stated understanding and did not have any other questions.

## 2013-04-25 ENCOUNTER — Ambulatory Visit: Payer: Medicaid Other | Admitting: Obstetrics & Gynecology

## 2014-02-17 ENCOUNTER — Encounter (HOSPITAL_COMMUNITY): Payer: Self-pay | Admitting: Emergency Medicine

## 2014-02-17 ENCOUNTER — Emergency Department (HOSPITAL_COMMUNITY): Payer: Medicaid Other

## 2014-02-17 DIAGNOSIS — N898 Other specified noninflammatory disorders of vagina: Secondary | ICD-10-CM | POA: Diagnosis not present

## 2014-02-17 DIAGNOSIS — T673XXA Heat exhaustion, anhydrotic, initial encounter: Secondary | ICD-10-CM | POA: Diagnosis not present

## 2014-02-17 DIAGNOSIS — R059 Cough, unspecified: Secondary | ICD-10-CM | POA: Diagnosis not present

## 2014-02-17 DIAGNOSIS — Z87442 Personal history of urinary calculi: Secondary | ICD-10-CM | POA: Diagnosis not present

## 2014-02-17 DIAGNOSIS — F17200 Nicotine dependence, unspecified, uncomplicated: Secondary | ICD-10-CM | POA: Diagnosis not present

## 2014-02-17 DIAGNOSIS — R1110 Vomiting, unspecified: Secondary | ICD-10-CM | POA: Diagnosis present

## 2014-02-17 DIAGNOSIS — N390 Urinary tract infection, site not specified: Secondary | ICD-10-CM | POA: Insufficient documentation

## 2014-02-17 DIAGNOSIS — Z3202 Encounter for pregnancy test, result negative: Secondary | ICD-10-CM | POA: Diagnosis not present

## 2014-02-17 DIAGNOSIS — Y929 Unspecified place or not applicable: Secondary | ICD-10-CM | POA: Diagnosis not present

## 2014-02-17 DIAGNOSIS — Y9389 Activity, other specified: Secondary | ICD-10-CM | POA: Diagnosis not present

## 2014-02-17 DIAGNOSIS — X30XXXA Exposure to excessive natural heat, initial encounter: Secondary | ICD-10-CM | POA: Diagnosis not present

## 2014-02-17 DIAGNOSIS — R05 Cough: Secondary | ICD-10-CM | POA: Diagnosis not present

## 2014-02-17 LAB — CBC WITH DIFFERENTIAL/PLATELET
BASOS ABS: 0.1 10*3/uL (ref 0.0–0.1)
Basophils Relative: 1 % (ref 0–1)
EOS PCT: 3 % (ref 0–5)
Eosinophils Absolute: 0.2 10*3/uL (ref 0.0–0.7)
HEMATOCRIT: 44.1 % (ref 36.0–46.0)
HEMOGLOBIN: 15.6 g/dL — AB (ref 12.0–15.0)
LYMPHS ABS: 2.6 10*3/uL (ref 0.7–4.0)
LYMPHS PCT: 37 % (ref 12–46)
MCH: 34.7 pg — ABNORMAL HIGH (ref 26.0–34.0)
MCHC: 35.4 g/dL (ref 30.0–36.0)
MCV: 98 fL (ref 78.0–100.0)
MONO ABS: 0.6 10*3/uL (ref 0.1–1.0)
MONOS PCT: 9 % (ref 3–12)
NEUTROS ABS: 3.5 10*3/uL (ref 1.7–7.7)
Neutrophils Relative %: 50 % (ref 43–77)
Platelets: 188 10*3/uL (ref 150–400)
RBC: 4.5 MIL/uL (ref 3.87–5.11)
RDW: 12.9 % (ref 11.5–15.5)
WBC: 6.9 10*3/uL (ref 4.0–10.5)

## 2014-02-17 LAB — URINALYSIS, ROUTINE W REFLEX MICROSCOPIC
BILIRUBIN URINE: NEGATIVE
Glucose, UA: NEGATIVE mg/dL
Ketones, ur: NEGATIVE mg/dL
NITRITE: POSITIVE — AB
PROTEIN: NEGATIVE mg/dL
Specific Gravity, Urine: 1.016 (ref 1.005–1.030)
UROBILINOGEN UA: 0.2 mg/dL (ref 0.0–1.0)
pH: 6 (ref 5.0–8.0)

## 2014-02-17 LAB — COMPREHENSIVE METABOLIC PANEL
ALT: 11 U/L (ref 0–35)
AST: 18 U/L (ref 0–37)
Albumin: 4 g/dL (ref 3.5–5.2)
Alkaline Phosphatase: 103 U/L (ref 39–117)
BILIRUBIN TOTAL: 0.2 mg/dL — AB (ref 0.3–1.2)
BUN: 12 mg/dL (ref 6–23)
CHLORIDE: 103 meq/L (ref 96–112)
CO2: 19 meq/L (ref 19–32)
CREATININE: 0.78 mg/dL (ref 0.50–1.10)
Calcium: 9.3 mg/dL (ref 8.4–10.5)
GLUCOSE: 94 mg/dL (ref 70–99)
Potassium: 3.7 mEq/L (ref 3.7–5.3)
Sodium: 140 mEq/L (ref 137–147)
Total Protein: 7 g/dL (ref 6.0–8.3)

## 2014-02-17 LAB — URINE MICROSCOPIC-ADD ON

## 2014-02-17 LAB — PREGNANCY, URINE: PREG TEST UR: NEGATIVE

## 2014-02-17 NOTE — ED Notes (Signed)
Pt. reports headache onset this morning after working out under the sun , productive cough and emesis this evening . Alert and oriented / respirations unlabored .

## 2014-02-18 ENCOUNTER — Emergency Department (HOSPITAL_COMMUNITY)
Admission: EM | Admit: 2014-02-18 | Discharge: 2014-02-18 | Disposition: A | Discharge status: Home / Self Care | Payer: Medicaid Other | Attending: Emergency Medicine | Admitting: Emergency Medicine

## 2014-02-18 DIAGNOSIS — N939 Abnormal uterine and vaginal bleeding, unspecified: Secondary | ICD-10-CM

## 2014-02-18 DIAGNOSIS — N390 Urinary tract infection, site not specified: Secondary | ICD-10-CM

## 2014-02-18 DIAGNOSIS — T675XXA Heat exhaustion, unspecified, initial encounter: Secondary | ICD-10-CM

## 2014-02-18 MED ORDER — CEPHALEXIN 500 MG PO CAPS: 500.0000 mg | ORAL_CAPSULE | Freq: Four times a day (QID) | ORAL | Status: DC

## 2014-02-18 NOTE — ED Notes (Signed)
MD Mingo Amber at bedside to assess pt

## 2014-02-18 NOTE — ED Provider Notes (Signed)
CSN: [id number]     Arrival date & time 02/17/14  2108 History   First MD Initiated Contact with Patient 02/18/14 0216     Chief Complaint  Patient presents with  . Headache  . Emesis  . Cough     (Consider location/radiation/quality/duration/timing/severity/associated sxs/prior Treatment) Patient is a 43 y.o. female presenting with headaches. The history is provided by the patient.  Headache Pain location:  Generalized Quality:  Dull Radiates to:  Does not radiate Onset quality:  Gradual Timing:  Constant Progression:  Resolved Chronicity:  New Similar to prior headaches: no   Context comment:  Working outside in the heat Relieved by: excedrin. Worsened by:  Sunday Corn and activity Associated symptoms: cough, dizziness and vomiting   Associated symptoms: no abdominal pain, no fever, no syncope and no URI     Past Medical History  Diagnosis Date  . Abnormal Pap smear 2001    [facility]- told her that she had "pods" didnt get tx  . Kidney stones 2011, 2012  . DUB (dysfunctional uterine bleeding)    History reviewed. No pertinent past surgical history. Family History  Problem Relation Age of Onset  . Cancer Mother     breast  . Cancer Maternal Grandmother     breast, colon  . Diabetes Paternal Grandmother    History  Substance Use Topics  . Smoking status: Current Every Day Smoker -- 0.50 packs/day    Types: Cigarettes  . Smokeless tobacco: Never Used  . Alcohol Use: No   OB History   Grav Para Term Preterm Abortions TAB SAB Ect Mult Living   1 1  1  0 0 0 0 0 1     Review of Systems  Constitutional: Negative for fever and chills.  Respiratory: Positive for cough. Negative for shortness of breath.   Cardiovascular: Negative for syncope.  Gastrointestinal: Positive for vomiting. Negative for abdominal pain.  Neurological: Positive for dizziness and headaches.  All other systems reviewed and are negative.     Allergies  Review of patient's allergies  indicates no known allergies.  Home Medications   Prior to Admission medications   Medication Sig Start Date End Date Taking? Authorizing Provider  aspirin-acetaminophen-caffeine (EXCEDRIN MIGRAINE) [phone] MG per tablet Take 1 tablet by mouth every 6 (six) hours as needed for headache.   Yes Historical Provider, MD  ibuprofen (ADVIL,MOTRIN) 200 MG tablet Take 200 mg by mouth every 6 (six) hours as needed. For pain   Yes Historical Provider, MD   BP 124/90  Pulse 69  Temp(Src) 98.3 F (36.8 C) (Oral)  Resp 18  SpO2 98% Physical Exam  Nursing note and vitals reviewed. Constitutional: She is oriented to person, place, and time. She appears well-developed and well-nourished. No distress.  HENT:  Head: Normocephalic and atraumatic.  Mouth/Throat: Oropharynx is clear and moist.  Eyes: EOM are normal. Pupils are equal, round, and reactive to light.  Neck: Normal range of motion. Neck supple.  Cardiovascular: Normal rate and regular rhythm.  Exam reveals no friction rub.   No murmur heard. Pulmonary/Chest: Effort normal and breath sounds normal. No respiratory distress. She has no wheezes. She has no rales.  Abdominal: Soft. She exhibits no distension. There is no tenderness. There is no rebound.  Musculoskeletal: Normal range of motion. She exhibits no edema.  Neurological: She is alert and oriented to person, place, and time. No cranial nerve deficit. She exhibits normal muscle tone. Coordination normal.  Skin: No rash noted. She is not  diaphoretic.    ED Course  Procedures (including critical care time) Labs Review Labs Reviewed  CBC WITH DIFFERENTIAL - Abnormal; Notable for the following:    Hemoglobin 15.6 (*)    MCH 34.7 (*)    All other components within normal limits  COMPREHENSIVE METABOLIC PANEL - Abnormal; Notable for the following:    Total Bilirubin 0.2 (*)    All other components within normal limits  URINALYSIS, ROUTINE W REFLEX MICROSCOPIC - Abnormal; Notable  for the following:    APPearance CLOUDY (*)    Hgb urine dipstick MODERATE (*)    Nitrite POSITIVE (*)    Leukocytes, UA LARGE (*)    All other components within normal limits  URINE MICROSCOPIC-ADD ON - Abnormal; Notable for the following:    Squamous Epithelial / LPF MANY (*)    Bacteria, UA MANY (*)    All other components within normal limits  PREGNANCY, URINE    Imaging Review Dg Chest 2 View  02/17/2014   CLINICAL DATA:  Headaches, emesis, and cough today.  Smoker.  EXAM: CHEST  2 VIEW  COMPARISON:  None.  FINDINGS: The heart size and mediastinal contours are within normal limits. Both lungs are clear. The visualized skeletal structures are unremarkable.  IMPRESSION: No active cardiopulmonary disease.   Electronically Signed   By: Lucienne Capers M.D.   On: 02/17/2014 22:32     EKG Interpretation None      MDM   Final diagnoses:  UTI (lower urinary tract infection)  Heat exhaustion, initial encounter  Vaginal bleeding    43 year old female presents with headache, cough, vomiting. She began to feel weak after working outside in the heat in her garden in the middle of the day. She went inside and continued to feel worse. Denies any dysuria, chest pain, fever. She took an Excedrin Migraine and has now resolved her headache. She is feeling back to normal. She is not vomiting. Labs are unremarkable except for UTI. Will place on Keflex. Instructed to followup with PCP. Also having vaginal bleeding for past 3 days - Hgb stable, no dizziness. Deferred pelvic, given OB/GYN f/u.    Osvaldo Shipper, MD 02/18/14 [phone]

## 2014-02-18 NOTE — Discharge Instructions (Signed)
Heat-Related Illness °Heat-related illnesses occur when the body is unable to properly cool itself. The body normally cools itself by sweating. However, under some conditions sweating is not enough. In these cases, a person's body temperature rises rapidly. Very high body temperatures may damage the brain or other vital organs. Some examples of heat-related illnesses include: °· Heat stroke. This occurs when the body is unable to regulate its temperature. The body's temperature rises rapidly, the sweating mechanism fails, and the body is unable to cool down. Body temperature may rise to 106° F (41° C) or higher within 10 to 15 minutes. Heat stroke can cause death or permanent disability if emergency treatment is not provided. °· Heat exhaustion. This is a milder form of heat-related illness that can develop after several days of exposure to high temperatures and not enough fluids. It is the body's response to an excessive loss of the water and salt contained in sweat. °· Heat cramps. These usually affect people who sweat a lot during heavy activity. This sweating drains the body's salt and moisture. The low salt level in the muscles causes painful cramps. Heat cramps may also be a symptom of heat exhaustion. Heat cramps usually occur in the abdomen, arms, or legs. Get medical attention for cramps if you have heart problems or are on a low-sodium diet. °Those that are at greatest risk for heat-related illnesses include:  °· The elderly. °· Infant and the very young. °· People with mental illness and chronic diseases. °· People who are overweight (obese). °· Young and healthy people can even succumb to heat if they participate in strenuous physical activities during hot weather. °CAUSES  °Several factors affect the body's ability to cool itself during extremely hot weather. When the humidity is high, sweat will not evaporate as quickly. This prevents the body from releasing heat quickly. Other factors that can affect  the body's ability to cool down include:  °· Age. °· Obesity. °· Fever. °· Dehydration. °· Heart disease. °· Mental illness. °· Poor circulation. °· Sunburn. °· Prescription drug use. °· Alcohol use. °SYMPTOMS  °Heat stroke: Warning signs of heat stroke vary, but may include: °· An extremely high body temperature (above 103°F orally). °· A fast, strong pulse. °· Dizziness. °· Confusion. °· Red, hot, and dry skin. °· No sweating. °· Throbbing headache. °· Feeling sick to your stomach (nauseous). °· Unconsciousness. °Heat exhaustion: Warning signs of heat exhaustion include: °· Heavy sweating. °· Tiredness. °· Headache. °· Paleness. °· Weakness. °· Feeling sick to your stomach (nauseous) or vomiting. °· Muscle cramps. °Heat cramps °· Muscle pains or spasms. °TREATMENT  °Heat stroke °· Get into a cool environment. An indoor place that is air-conditioned may be best. °· Take a cool shower or bath. Have someone around to make sure you are okay. °· Take your temperature. Make sure it is going down. °Heat exhaustion °· Drink plenty of fluids. Do not drink liquids that contain caffeine, alcohol, or large amounts of sugar. These cause you to lose more body fluid. Also, avoid very cold drinks. They can cause stomach cramps. °· Get into a cool environment. An indoor place that is air-conditioned may be best. °· Take a cool shower or bath. Have someone around to make sure you are okay. °· Put on lightweight clothing. °Heat cramps °· Stop whatever activity you were doing. Do not attempt to do that activity for at least 3 hours after the cramps have gone away. °· Get into a cool environment. An indoor   place that is air-conditioned may be best. HOME CARE INSTRUCTIONS  To protect your health when temperatures are extremely high, follow these tips:  During heavy exercise in a hot environment, drink two to four glasses (16-32 ounces) of cool fluids each hour. Do not wait until you are thirsty to drink. Warning: If your caregiver  limits the amount of fluid you drink or has you on water pills, ask how much you should drink while the weather is hot.  Do not drink liquids that contain caffeine, alcohol, or large amounts of sugar. These cause you to lose more body fluid.  Avoid very cold drinks. They can cause stomach cramps.  Wear appropriate clothing. Choose lightweight, light-colored, loose-fitting clothing.  If you must be outdoors, try to limit your outdoor activity to morning and evening hours. Try to rest often in shady areas.  If you are not used to working or exercising in a hot environment, start slowly and pick up the pace gradually.  Stay cool in an air-conditioned place if possible. If your home does not have air conditioning, go to the shopping mall or [employer].  Taking a cool shower or bath may help you cool off. SEEK MEDICAL CARE IF:   You see any of the symptoms listed above. You may be dealing with a life-threatening emergency.  Symptoms worsen or last longer than 1 hour.  Heat cramps do not get better in 1 hour. MAKE SURE YOU:   Understand these instructions.  Will watch your condition.  Will get help right away if you are not doing well or get worse. Document Released: 05/24/2008 Document Revised: 11/07/2011 Document Reviewed: 05/24/2008 [facility] Patient Information 2015 [place], [place]. This information is not intended to replace advice given to you by your health care provider. Make sure you discuss any questions you have with your health care provider.  Urinary Tract Infection Urinary tract infections (UTIs) can develop anywhere along your urinary tract. Your urinary tract is your body's drainage system for removing wastes and extra water. Your urinary tract includes two kidneys, two ureters, a bladder, and a urethra. Your kidneys are a pair of bean-shaped organs. Each kidney is about the size of your fist. They are located below your ribs, one on each side of your  spine. CAUSES Infections are caused by microbes, which are microscopic organisms, including fungi, viruses, and bacteria. These organisms are so small that they can only be seen through a microscope. Bacteria are the microbes that most commonly cause UTIs. SYMPTOMS  Symptoms of UTIs may vary by age and gender of the patient and by the location of the infection. Symptoms in young women typically include a frequent and intense urge to urinate and a painful, burning feeling in the bladder or urethra during urination. Older women and men are more likely to be tired, shaky, and weak and have muscle aches and abdominal pain. A fever may mean the infection is in your kidneys. Other symptoms of a kidney infection include pain in your back or sides below the ribs, nausea, and vomiting. DIAGNOSIS To diagnose a UTI, your caregiver will ask you about your symptoms. Your caregiver also will ask to provide a urine sample. The urine sample will be tested for bacteria and white blood cells. White blood cells are made by your body to help fight infection. TREATMENT  Typically, UTIs can be treated with medication. Because most UTIs are caused by a bacterial infection, they usually can be treated with the use of antibiotics. The  choice of antibiotic and length of treatment depend on your symptoms and the type of bacteria causing your infection. HOME CARE INSTRUCTIONS  If you were prescribed antibiotics, take them exactly as your caregiver instructs you. Finish the medication even if you feel better after you have only taken some of the medication.  Drink enough water and fluids to keep your urine clear or pale yellow.  Avoid caffeine, tea, and carbonated beverages. They tend to irritate your bladder.  Empty your bladder often. Avoid holding urine for long periods of time.  Empty your bladder before and after sexual intercourse.  After a bowel movement, women should cleanse from front to back. Use each tissue only  once. SEEK MEDICAL CARE IF:   You have back pain.  You develop a fever.  Your symptoms do not begin to resolve within 3 days. SEEK IMMEDIATE MEDICAL CARE IF:   You have severe back pain or lower abdominal pain.  You develop chills.  You have nausea or vomiting.  You have continued burning or discomfort with urination. MAKE SURE YOU:   Understand these instructions.  Will watch your condition.  Will get help right away if you are not doing well or get worse. Document Released: 05/25/2005 Document Revised: 02/14/2012 Document Reviewed: 09/23/2011 [facility] Patient Information 2015 [place], [place]. This information is not intended to replace advice given to you by your health care provider. Make sure you discuss any questions you have with your health care provider.

## 2014-02-18 NOTE — ED Notes (Signed)
Pt presents with having a headache and feeling disoriented starting yesterday after moving her 3rd load of bricks. Pt reports her headache has now subsided. Pt requesting a referral for an Gynecologist that will see her with no insurance due to moderate vaginal bleeding x3 years.

## 2014-03-02 ENCOUNTER — Encounter (HOSPITAL_COMMUNITY): Payer: Self-pay | Admitting: Emergency Medicine

## 2014-03-02 ENCOUNTER — Emergency Department (HOSPITAL_COMMUNITY)
Admission: EM | Admit: 2014-03-02 | Discharge: 2014-03-02 | Disposition: A | Discharge status: Home / Self Care | Payer: Medicaid Other | Attending: Emergency Medicine | Admitting: Emergency Medicine

## 2014-03-02 ENCOUNTER — Emergency Department (HOSPITAL_COMMUNITY): Payer: Medicaid Other

## 2014-03-02 DIAGNOSIS — Z791 Long term (current) use of non-steroidal anti-inflammatories (NSAID): Secondary | ICD-10-CM | POA: Diagnosis not present

## 2014-03-02 DIAGNOSIS — S99919A Unspecified injury of unspecified ankle, initial encounter: Secondary | ICD-10-CM | POA: Diagnosis not present

## 2014-03-02 DIAGNOSIS — F17200 Nicotine dependence, unspecified, uncomplicated: Secondary | ICD-10-CM | POA: Insufficient documentation

## 2014-03-02 DIAGNOSIS — Z7982 Long term (current) use of aspirin: Secondary | ICD-10-CM | POA: Insufficient documentation

## 2014-03-02 DIAGNOSIS — Y92009 Unspecified place in unspecified non-institutional (private) residence as the place of occurrence of the external cause: Secondary | ICD-10-CM | POA: Insufficient documentation

## 2014-03-02 DIAGNOSIS — S8990XA Unspecified injury of unspecified lower leg, initial encounter: Secondary | ICD-10-CM | POA: Diagnosis not present

## 2014-03-02 DIAGNOSIS — Z87442 Personal history of urinary calculi: Secondary | ICD-10-CM | POA: Diagnosis not present

## 2014-03-02 DIAGNOSIS — S99929A Unspecified injury of unspecified foot, initial encounter: Principal | ICD-10-CM

## 2014-03-02 DIAGNOSIS — M25571 Pain in right ankle and joints of right foot: Secondary | ICD-10-CM

## 2014-03-02 DIAGNOSIS — Z8742 Personal history of other diseases of the female genital tract: Secondary | ICD-10-CM | POA: Diagnosis not present

## 2014-03-02 DIAGNOSIS — W172XXA Fall into hole, initial encounter: Secondary | ICD-10-CM | POA: Diagnosis not present

## 2014-03-02 DIAGNOSIS — Y9389 Activity, other specified: Secondary | ICD-10-CM | POA: Insufficient documentation

## 2014-03-02 MED ORDER — HYDROCODONE-ACETAMINOPHEN 5-325 MG PO TABS: 2.0000 | ORAL_TABLET | Freq: Once | ORAL | Status: DC

## 2014-03-02 MED ORDER — HYDROCODONE-ACETAMINOPHEN 5-325 MG PO TABS: 1.0000 | ORAL_TABLET | ORAL | Status: DC | PRN

## 2014-03-02 MED ORDER — IBUPROFEN 800 MG PO TABS: 800.0000 mg | ORAL_TABLET | Freq: Three times a day (TID) | ORAL | Status: DC

## 2014-03-02 NOTE — ED Notes (Signed)
Ice pack and elevation applied to right ankle.

## 2014-03-02 NOTE — ED Provider Notes (Signed)
CSN: [id number]     Arrival date & time 03/02/14  1541 History   This chart was scribed for non-physician practitioner Lucila [place], PA-C,  working with Carmin Muskrat, MD, by Neta Ehlers, ED Scribe. This patient was seen in room TR09C/TR09C and the patient's care was started at 7:09 PM. First MD Initiated Contact with Patient 03/02/14 1815     Chief Complaint  Patient presents with  . Ankle Injury    The history is provided by the patient. No language interpreter was used.   HPI Comments: Brittany Herrera is a 43 y.o. female who presents to the Emergency Department complaining of right ankle pain which began yesterday evening. She reports she stepped in a hole in her yard and heard a "pop." She reports the ankle pain has been associated with swelling and numbness. No other injuries.The pt has used ibuprofen and ice with moderate relief. No similar pain in the past. Pain is a constant throbbing pain, which is worse with ambulation.    Past Medical History  Diagnosis Date  . Abnormal Pap smear 2001    [facility]- told her that she had "pods" didnt get tx  . Kidney stones 2011, 2012  . DUB (dysfunctional uterine bleeding)    History reviewed. No pertinent past surgical history. Family History  Problem Relation Age of Onset  . Cancer Mother     breast  . Cancer Maternal Grandmother     breast, colon  . Diabetes Paternal Grandmother    History  Substance Use Topics  . Smoking status: Current Every Day Smoker -- 0.50 packs/day    Types: Cigarettes  . Smokeless tobacco: Never Used  . Alcohol Use: No   OB History   Grav Para Term Preterm Abortions TAB SAB Ect Mult Living   1 1  1  0 0 0 0 0 1     Review of Systems  Constitutional: Negative for fever, chills, activity change, appetite change and fatigue.  Cardiovascular: Negative for leg swelling.  Musculoskeletal: Positive for arthralgias and joint swelling.  Skin: Negative for color change and wound.   Neurological: Positive for numbness. Negative for weakness.    Allergies  Review of patient's allergies indicates no known allergies.  Home Medications   Prior to Admission medications   Medication Sig Start Date End Date Taking? Authorizing Provider  aspirin-acetaminophen-caffeine (EXCEDRIN MIGRAINE) [phone] MG per tablet Take 1 tablet by mouth every 6 (six) hours as needed for headache.    Historical Provider, MD  cephALEXin (KEFLEX) 500 MG capsule Take 1 capsule (500 mg total) by mouth 4 (four) times daily. 02/18/14   Osvaldo Shipper, MD  ibuprofen (ADVIL,MOTRIN) 200 MG tablet Take 200 mg by mouth every 6 (six) hours as needed. For pain    Historical Provider, MD   Triage Vitals: BP 141/87  Pulse 97  Temp(Src) 98.1 F (36.7 C) (Oral)  Resp 20  SpO2 97%  LMP 03/02/2014  Filed Vitals:   03/02/14 1545  BP: 141/87  Pulse: 97  Temp: 98.1 F (36.7 C)  TempSrc: Oral  Resp: 20  SpO2: 97%    Physical Exam  Nursing note and vitals reviewed. Constitutional: She is oriented to person, place, and time. She appears well-developed and well-nourished. No distress.  HENT:  Head: Normocephalic and atraumatic.  Right Ear: External ear normal.  Left Ear: External ear normal.  Nose: Nose normal.  Mouth/Throat: Oropharynx is clear and moist.  Eyes: Conjunctivae are normal. Right eye exhibits no  discharge. Left eye exhibits no discharge.  Neck: Normal range of motion.  Cardiovascular: Normal rate.   Dorsalis pedis pulses present and equal bilaterally  Pulmonary/Chest: Effort normal.  Abdominal: Soft.  Musculoskeletal: Normal range of motion. She exhibits edema and tenderness.       Feet:  Tenderness to palpation to the right lateral malleolus with associated edema and ecchymosis. No tenderness to palpation to the right foot, calf, or knee. ROM intact in the right foot and ankle. Patient able to ambulate without difficulty or ataxia.   Neurological: She is alert and oriented  to person, place, and time.  Sensation intact in the LE   Skin: Skin is warm and dry. She is not diaphoretic.  No wounds to the right ankle     ED Course  Procedures (including critical care time)  DIAGNOSTIC STUDIES: Oxygen Saturation is 97% on room air, normal by my interpretation.    COORDINATION OF CARE:  7:12 PM- Discussed treatment plan with patient, and the patient agreed to the plan. The plan includes an ankle splint and pain medication.  Advised pt of RICE precautions. Also advised pt to wear shoes with good support.  Informed pt to follow-up with her PCP if needed.   Labs Review Labs Reviewed - No data to display  Imaging Review Dg Ankle Complete Right  03/02/2014   CLINICAL DATA:  [place].  Injured right ankle.  EXAM: RIGHT ANKLE - COMPLETE 3+ VIEW  COMPARISON:  None.  FINDINGS: D the ankle mortise is maintained. No acute ankle fracture or osteochondral lesion.  The mid and hindfoot bony structures are intact. Os trigonum is noted.  IMPRESSION: No acute fracture.   Electronically Signed   By: Kalman Jewels M.D.   On: 03/02/2014 16:24     EKG Interpretation None      MDM   Brittany Herrera is a 43 y.o. female who presents to the Emergency Department complaining of right ankle pain which began yesterday evening. Pain likely due to a sprain vs contusion. X-rays negative for fracture or malalignment. Patient neurovascularly intact. Patient has crutches in the ED. Ankle splint provided. RICE method discussed. Return precautions, discharge instructions, and follow-up was discussed with the patient before discharge.      Discharge Medication List as of 03/02/2014  7:20 PM    START taking these medications   Details  HYDROcodone-acetaminophen (NORCO/VICODIN) 5-325 MG per tablet Take 1-2 tablets by mouth every 4 (four) hours as needed for moderate pain or severe pain., Starting 03/02/2014, Until Discontinued, Print    !! ibuprofen (ADVIL,MOTRIN) 800 MG tablet Take 1 tablet (800  mg total) by mouth 3 (three) times daily., Starting 03/02/2014, Until Discontinued, Print     !! - Potential duplicate medications found. Please discuss with provider.       Final impressions: 1. Right ankle pain       Brittany Herrera   I personally performed the services described in this documentation, which was scribed in my presence. The recorded information has been reviewed and is accurate.        Lucila [place], PA-C 03/03/14 2110

## 2014-03-02 NOTE — Discharge Instructions (Signed)
Use RICE method - see below Use crutches Take Ibuprofen for mild-moderate pain - this will help with swelling  Take Vicodin for severe pain - Please be careful with this medication.  It can cause drowsiness.  Use caution while driving, operating machinery, drinking alcohol, or any other activities that may impair your physical or mental abilities.   Return to the emergency department if you develop any changing/worsening condition or any other concerns (please read additional information regarding your condition below)   Ankle Sprain An ankle sprain is an injury to the strong, fibrous tissues (ligaments) that hold the bones of your ankle joint together.  CAUSES An ankle sprain is usually caused by a fall or by twisting your ankle. Ankle sprains most commonly occur when you step on the outer edge of your foot, and your ankle turns inward. People who participate in sports are more prone to these types of injuries.  SYMPTOMS   Pain in your ankle. The pain may be present at rest or only when you are trying to stand or walk.  Swelling.  Bruising. Bruising may develop immediately or within 1 to 2 days after your injury.  Difficulty standing or walking, particularly when turning corners or changing directions. DIAGNOSIS  Your caregiver will ask you details about your injury and perform a physical exam of your ankle to determine if you have an ankle sprain. During the physical exam, your caregiver will press on and apply pressure to specific areas of your foot and ankle. Your caregiver will try to move your ankle in certain ways. An X-ray exam may be done to be sure a bone was not broken or a ligament did not separate from one of the bones in your ankle (avulsion fracture).  TREATMENT  Certain types of braces can help stabilize your ankle. Your caregiver can make a recommendation for this. Your caregiver may recommend the use of medicine for pain. If your sprain is severe, your caregiver may refer you  to a surgeon who helps to restore function to parts of your skeletal system (orthopedist) or a physical therapist. [place] ice to your injury for 1-2 days or as directed by your caregiver. Applying ice helps to reduce inflammation and pain.  Put ice in a plastic bag.  Place a towel between your skin and the bag.  Leave the ice on for 15-20 minutes at a time, every 2 hours while you are awake.  Only take over-the-counter or prescription medicines for pain, discomfort, or fever as directed by your caregiver.  Elevate your injured ankle above the level of your heart as much as possible for 2-3 days.  If your caregiver recommends crutches, use them as instructed. Gradually put weight on the affected ankle. Continue to use crutches or a cane until you can walk without feeling pain in your ankle.  If you have a plaster splint, wear the splint as directed by your caregiver. Do not rest it on anything harder than a pillow for the first 24 hours. Do not put weight on it. Do not get it wet. You may take it off to take a shower or bath.  You may have been given an elastic bandage to wear around your ankle to provide support. If the elastic bandage is too tight (you have numbness or tingling in your foot or your foot becomes cold and blue), adjust the bandage to make it comfortable.  If you have an air splint, you may blow more air  into it or let air out to make it more comfortable. You may take your splint off at night and before taking a shower or bath. Wiggle your toes in the splint several times per day to decrease swelling. SEEK MEDICAL CARE IF:   You have rapidly increasing bruising or swelling.  Your toes feel extremely cold or you lose feeling in your foot.  Your pain is not relieved with medicine. SEEK IMMEDIATE MEDICAL CARE IF:  Your toes are numb or blue.  You have severe pain that is increasing. MAKE SURE YOU:   Understand these instructions.  Will watch  your condition.  Will get help right away if you are not doing well or get worse. Document Released: 08/15/2005 Document Revised: 05/09/2012 Document Reviewed: 08/27/2011 [facility] Patient Information 2015 [place], [place]. This information is not intended to replace advice given to you by your health care provider. Make sure you discuss any questions you have with your health care provider.  RICE: Routine Care for Injuries The routine care of many injuries includes Rest, Ice, Compression, and Elevation (RICE). HOME CARE INSTRUCTIONS  Rest is needed to allow your body to heal. Routine activities can usually be resumed when comfortable. Injured tendons and bones can take up to 6 weeks to heal. Tendons are the cord-like structures that attach muscle to bone.  Ice following an injury helps keep the swelling down and reduces pain.  Put ice in a plastic bag.  Place a towel between your skin and the bag.  Leave the ice on for 15-20 minutes, 3-4 times a day, or as directed by your health care provider. Do this while awake, for the first 24 to 48 hours. After that, continue as directed by your caregiver.  Compression helps keep swelling down. It also gives support and helps with discomfort. If an elastic bandage has been applied, it should be removed and reapplied every 3 to 4 hours. It should not be applied tightly, but firmly enough to keep swelling down. Watch fingers or toes for swelling, bluish discoloration, coldness, numbness, or excessive pain. If any of these problems occur, remove the bandage and reapply loosely. Contact your caregiver if these problems continue.  Elevation helps reduce swelling and decreases pain. With extremities, such as the arms, hands, legs, and feet, the injured area should be placed near or above the level of the heart, if possible. SEEK IMMEDIATE MEDICAL CARE IF:  You have persistent pain and swelling.  You develop redness, numbness, or unexpected weakness.  Your  symptoms are getting worse rather than improving after several days. These symptoms may indicate that further evaluation or further X-rays are needed. Sometimes, X-rays may not show a small broken bone (fracture) until 1 week or 10 days later. Make a follow-up appointment with your caregiver. Ask when your X-ray results will be ready. Make sure you get your X-ray results. Document Released: 11/27/2000 Document Revised: 08/20/2013 Document Reviewed: 01/14/2011 [facility] Patient Information 2015 [place], [place]. This information is not intended to replace advice given to you by your health care provider. Make sure you discuss any questions you have with your health care provider.   Emergency Department Resource Guide 1) Find a Doctor and Pay Out of Pocket Although you won't have to find out who is covered by your insurance plan, it is a good idea to ask around and get recommendations. You will then need to call the office and see if the doctor you have chosen will accept you as a new patient and  what types of options they offer for patients who are self-pay. Some doctors offer discounts or will set up payment plans for their patients who do not have insurance, but you will need to ask so you aren't surprised when you get to your appointment.  2) Contact Your Local Health Department Not all health departments have doctors that can see patients for sick visits, but many do, so it is worth a call to see if yours does. If you don't know where your local health department is, you can check in your phone book. The CDC also has a tool to help you locate your state's health department, and many state websites also have listings of all of their local health departments.  3) Find a [facility] If your illness is not likely to be very severe or complicated, you may want to try a walk in clinic. These are popping up all over the country in pharmacies, drugstores, and shopping centers. They're usually staffed by  nurse practitioners or physician assistants that have been trained to treat common illnesses and complaints. They're usually fairly quick and inexpensive. However, if you have serious medical issues or chronic medical problems, these are probably not your best option.  No Primary Care Doctor: - Call Health Connect at  [phone] - they can help you locate a primary care doctor that  accepts your insurance, provides certain services, etc. - Physician Referral Service- [phone]  Chronic Pain Problems: Organization         Address  Phone   Notes  [facility]  [phone] Patients need to be referred by their primary care doctor.   Medication Assistance: Organization         Address  Phone   Notes  [facility] Medication [facility] [address] [place], [place] [address] [phone] --Must be a resident of [facility] -- Must have NO insurance coverage whatsoever (no Medicaid/ Medicare, etc.) -- The pt. MUST have a primary care doctor that directs their care regularly and follows them in the community   MedAssist  [phone]   [employer]  [phone]    Agencies that provide inexpensive medical care: Organization         Address  Phone   Notes  [place]  [phone]   [facility]    [phone]   [facility] [place], [place] [address] [phone]   [place] [address], [place] [phone]   Planned Parenthood    ([phone]   [facility]    [phone]   [place] and [address], [place] Phone:  [phone], Fax:  [phone] Hours of Operation:  9 am - 6 pm, M-F.  Also accepts Medicaid/Medicare and self-pay.  [facility] for [place], [address], [place] Phone: [phone], Fax: [phone]. Hours of Operation:  8:30  am - 5:30 pm, M-F.  Also accepts Medicaid and self-pay.  [facility] [address], [place] Phone: [phone]   [place], [place], [place] [phone], Ext. [phone] Mondays & Thursdays: 7-9 AM.  First 15 patients are seen on a first come, first serve basis.    [place] Providers:  Patent attorney  Notes  [facility] [address], [place] [phone] Also accepts self-pay patients.  [facility] [address], [place]  [phone]   [place], [address], [place] [phone]   [facility] [address], [place] [phone]   Lucianne Lei [address], [place]   [phone] Only accepts [place] Access [place] patients after they have their name applied to their card.   Self-Pay (no insurance) in [facility]:  Organization         Address  Phone   Notes  Sickle Cell Patients, [facility] [place] [phone]   [facility] [place] [phone]   [facility] [place]  [place], [place], [place] [phone]   [facility]/Dr. Osei-Bonsu  [address] [place] or [address], [place] [phone] Phone number for both [place] and [place] locations is the same.  Urgent Medical and [facility] [address], [place] [phone]   [facility] [address], [place] or [address] [phone]   [facility] [address], [place] [phone], phone; [phone], fax Sees patients 1st and 3rd Saturday of every month.  Must not qualify for public or private insurance (i.e. Medicaid, Medicare, [place] Health  Choice, Veterans' Benefits)  Household income should be no more than 200% of the poverty level The clinic cannot treat you if you are pregnant or think you are pregnant  Sexually transmitted diseases are not treated at the clinic.    Dental Care: Organization         Address  Phone  Notes  [facility] Department of [facility] [place] [phone] Accepts children up to age 95 who are enrolled in [place] or [place]; pregnant women with a Medicaid card; and children who have applied for Medicaid or [place] Health Choice, but were declined, whose parents can pay a reduced fee at time of service.  [facility] Department of [facility]  [address], [place] [phone] Accepts children up to age 57 who are enrolled in [place] or [place]; pregnant women with a Medicaid card; and children who have applied for Medicaid or [place] Health Choice, but were declined, whose parents can pay a reduced fee at time of service.  [place] Adult Dental Access PROGRAM  [place] [phone] Patients are seen by appointment only. Walk-ins are not accepted. [place] will see patients 38 years of age and older. Monday - Tuesday (8am-5pm) Most Wednesdays (8:30-5pm) $30 per visit, cash only  [facility] Adult Dental Access PROGRAM  [address], [facility] [phone] Patients are seen by appointment only. Walk-ins are not accepted. [place] will see patients 35 years of age and older. One Wednesday Evening (Monthly: Volunteer Based).  $30 per visit, cash only  [place]  [phone] for adults; Children under age 48, call Graduate Pediatric Dentistry at [phone]. Children aged 84-14, please call [phone] to request a pediatric application.  Dental services are provided in all areas of dental care including fillings, crowns and bridges, complete  and partial dentures, implants, gum treatment, root canals, and extractions. Preventive care is also provided. Treatment is provided to both adults and children. Patients are selected via a lottery and there is often a waiting list.   [facility] [address], [place]  [phone] [email or web address]   [facility] [address] [place], [place] [phone], Ext. [phone] Second and Fourth Thursday of each month, opens at 6:30 AM; Clinic ends at 9 AM.  Patients are seen on a first-come first-served basis, and a limited number are seen during each clinic.   [facility]  [address] Hillard Danker [place], [place] [phone]   Eligibility Requirements You must have lived in [place], [place], or [place] counties for at least the last three months.   You cannot be eligible for state or federal sponsored [employer], including [employer], [place], or [employer].   You generally cannot be eligible for healthcare insurance through your employer.    How to apply: Eligibility screenings are held every Tuesday and Wednesday afternoon from 1:00 pm until 4:00 pm. You do not need an appointment for the interview!  [facility] [address], [place], [place]   [place]  [place] Department  [place]  [phone]    Behavioral Health Resources in the Community: Intensive Outpatient Programs Organization         Address  Phone  Notes  [place]. [address] [place], [place] [phone]   [facility] [address], [place], [place]   ADS: Alcohol & Drug Svcs [address] [place], [place]   [facility] [address],  [place], [place] or [phone]   Substance Abuse Resources Organization          Address  Phone  Notes  Alcohol and Drug Services  [phone]   [place]  [phone]   [facility] [place]   Chinita Pester  [phone]   Residential & Outpatient Substance Abuse Program  [phone]   Psychological Services Organization         Address  Phone  Notes  [facility]  [place]  [phone]   [place] [address] [place] or [phone]    Mobile Crisis Teams Organization         Address  Phone  Notes  [facility], Mobile Crisis Care Unit  [phone]   [facility]ive Psychotherapeutic Services  [address] [place], [place]   [place] [address] [place] [phone]    Self-Help/Support Groups Organization         Address  Phone             Notes  [place]. of [place] - variety of support groups  [place] Call for more information  Narcotics Anonymous (NA), [facility] [address], [employer] [place]  2 meetings at this location   Special educational needs teacher         Address  Phone  Notes  [facility]AP Residential Treatment [place],    [place]  [address] [place] [phone], [place], [place]   [place], [place] [phone] Admissions: 8am-3pm M-F  Incentives Substance [place] [address]    [place], [place] [phone]   [facility] [address] [place], [place], [place]   [facility] [address],  [place], [place]   [facility]ight Programs - Intensive Outpatient [address], [place], [place]   [facility] [place].,  [place], [place] [phone] or [phone]   [facility]idential Treatment Services (RTS) [address], [place], [place] Accepts Medicaid  [facility] [place] [address],  [place] [phone] Substance Abuse/Addiction Treatment   [facility] [place] Organization         Address  Phone  Notes  [facility]  [phone]   Domenic Schwab, PhD [address] Arlis [place], [place]   [phone] or [phone]   [facility] [place], [place] [phone]   [facility] [address] [place], [place] [phone] Insurance/Medicaid/sponsorship through [facility] and Families [address]                                    [place], [place] [phone] [place] [address], [place] [phone]    Dr. Adele Schilder  [phone]   [facility] Dept. 1) [address] [place] 2) [place] 3)  [place] [address], [place] [phone]  [phone]   [place] [phone] or [phone] (After Hours)

## 2014-03-02 NOTE — ED Notes (Signed)
The pt is c/o pain in the rt ankle after  She stepped in a hole and felt something pop in her rt ankle yesterday.  Swollen laterally.   lmp now

## 2014-03-06 NOTE — ED Provider Notes (Signed)
  Medical screening examination/treatment/procedure(s) were performed by non-physician practitioner and as supervising physician I was immediately available for consultation/collaboration.   EKG Interpretation None         Carmin Muskrat, MD 03/06/14 [phone]

## 2014-03-24 ENCOUNTER — Ambulatory Visit (INDEPENDENT_AMBULATORY_CARE_PROVIDER_SITE_OTHER): Payer: Self-pay | Admitting: Obstetrics & Gynecology

## 2014-03-24 ENCOUNTER — Encounter: Payer: Self-pay | Admitting: Obstetrics & Gynecology

## 2014-03-24 ENCOUNTER — Other Ambulatory Visit (HOSPITAL_COMMUNITY)
Admission: RE | Admit: 2014-03-24 | Discharge: 2014-03-24 | Disposition: A | Discharge status: Home / Self Care | Payer: Medicaid Other | Source: Ambulatory Visit | Attending: Obstetrics & Gynecology | Admitting: Obstetrics & Gynecology

## 2014-03-24 VITALS — BP 152/89 | HR 75 | Temp 98.4°F | Ht 60.0 in | Wt 185.5 lb

## 2014-03-24 DIAGNOSIS — N949 Unspecified condition associated with female genital organs and menstrual cycle: Secondary | ICD-10-CM

## 2014-03-24 DIAGNOSIS — N938 Other specified abnormal uterine and vaginal bleeding: Secondary | ICD-10-CM

## 2014-03-24 DIAGNOSIS — N925 Other specified irregular menstruation: Secondary | ICD-10-CM | POA: Insufficient documentation

## 2014-03-24 LAB — POCT PREGNANCY, URINE: PREG TEST UR: NEGATIVE

## 2014-03-24 MED ORDER — MEGESTROL ACETATE 20 MG PO TABS: 40.0000 mg | ORAL_TABLET | Freq: Every day | ORAL | Status: DC

## 2014-03-24 NOTE — Progress Notes (Signed)
   CLINIC ENCOUNTER NOTE  History:  43 y.o. G1P0101 here today for evaluation of chronic AUB.  Was treated with Provera in the past but she doesnot have anymore medication. Also wants something for contraception.   The following portions of the patient's history were reviewed and updated as appropriate: allergies, current medications, past family history, past medical history, past social history, past surgical history and problem list. Normal pap and negative HRHPV on 01/07/13.  Review of Systems:  Pertinent items are noted in HPI.  Objective:  Physical Exam BP 152/89  Pulse 75  Temp(Src) 98.4 F (36.9 C) (Oral)  Ht 5' (1.524 m)  Wt 185 lb 8 oz (84.142 kg)  BMI 36.23 kg/m2  LMP 03/02/2014 Gen: NAD Abd: Soft, nontender and nondistended Pelvic: Normal appearing external genitalia; normal appearing vaginal mucosa and cervix.  Normal discharge.  Normall uterus, no other palpable masses, no uterine or adnexal tenderness  ENDOMETRIAL BIOPSY     The indications for endometrial biopsy were reviewed.   Risks of the biopsy including cramping, bleeding, infection, uterine perforation, inadequate specimen and need for additional procedures  were discussed. The patient states she understands and agrees to undergo procedure today. Consent was signed. Time out was performed. Urine HCG was negative. During the pelvic exam, the cervix was prepped with Betadine. A single-toothed tenaculum was placed on the anterior lip of the cervix to stabilize it. The 3 mm pipelle was introduced into the endometrial cavity without difficulty to a depth of 9 cm, and a small amount of tissue was obtained and sent to pathology. The instruments were removed from the patient's vagina. Minimal bleeding from the cervix was noted. The patient tolerated the procedure well. Routine post-procedure instructions were given to the patient.    Imaging 02/27/2013   TRANSABDOMINAL AND TRANSVAGINAL ULTRASOUND OF PELVIS Clinical Data:  Dysfunctional uterine bleeding. Prescribed Provera on 01/09/2013. LMP 11/05/2012 with continuous bleeding since Findings: Uterus: Is anteverted and anteflexed and demonstrates a sagittal length of 9.4 cm, depth of 4.9 cm and width of 6.2 cm. A homogeneous myometrium is seen   Endometrium: Appears homogeneously echogenic with a width of 7 mm.  No areas of focal thickening or heterogeneity are seen.   Right ovary: Measures 3.5 x 3.5 x 3.4 cm and contains a dominant follicle.  Left ovary: Measures 3.5 x 2.2 x 2.7 cm and has a normal appearance. Other findings: No pelvic fluid or separate adnexal masses are seen.  IMPRESSION:  Normal pelvic ultrasound.   Assessment & Plan:  Endometrial biopsy done, will follow up results and manage accordingly. Discussed management options for abnormal uterine bleeding including oral progesterone, Depo Provera, Mirena IUD, endometrial ablation (Novasure/Hydrothermal Ablation) or hysterectomy as definitive surgical management.  Discussed risks and benefits of each method.  Printed patient education handouts were given to the patient to review at home.  Patient desires Mirena IUD but Megace in the interim.  Megace taper prescribed as needed for now,  bleeding precautions reviewed. [employer] foundation application and mammogram application filled out today by patient. Will return for Mirena placement once available.   Verita Schneiders, MD, [place] Attending [place] for [employer], [place]

## 2014-03-24 NOTE — Progress Notes (Signed)
[employer] foundation scholarship forms filled out and signed. Patient given copy of what to bring for proof of income. Patient informed that form will not be sent out without proof of income. Patient verbalized understanding and stated she will bring in proof of food stamps tomorrow.

## 2014-03-24 NOTE — Patient Instructions (Signed)
Levonorgestrel intrauterine device (IUD) What is this medicine? LEVONORGESTREL IUD (LEE voe nor jes trel) is a contraceptive (birth control) device. The device is placed inside the uterus by a healthcare professional. It is used to prevent pregnancy and can also be used to treat heavy bleeding that occurs during your period. Depending on the device, it can be used  5 years. This medicine may be used for other purposes; ask your health care provider or pharmacist if you have questions. COMMON BRAND NAME(S): Verda Cumins What should I tell my health care provider before I take this medicine? They need to know if you have any of these conditions: -abnormal Pap smear -cancer of the breast, uterus, or cervix -diabetes -endometritis -genital or pelvic infection now or in the past -have more than one sexual partner or your partner has more than one partner -heart disease -history of an ectopic or tubal pregnancy -immune system problems -IUD in place -liver disease or tumor -problems with blood clots or take blood-thinners -use intravenous drugs -uterus of unusual shape -vaginal bleeding that has not been explained -an unusual or allergic reaction to levonorgestrel, other hormones, silicone, or polyethylene, medicines, foods, dyes, or preservatives -pregnant or trying to get pregnant -breast-feeding How should I use this medicine? This device is placed inside the uterus by a health care professional. Talk to your pediatrician regarding the use of this medicine in children. Special care may be needed. Overdosage: If you think you have taken too much of this medicine contact a poison control center or emergency room at once. NOTE: This medicine is only for you. Do not share this medicine with others. What if I miss a dose? This does not apply. What may interact with this medicine? Do not take this medicine with any of the following  medications: -amprenavir -bosentan -fosamprenavir This medicine may also interact with the following medications: -aprepitant -barbiturate medicines for inducing sleep or treating seizures -bexarotene -griseofulvin -medicines to treat seizures like carbamazepine, ethotoin, felbamate, oxcarbazepine, phenytoin, topiramate -modafinil -pioglitazone -rifabutin -rifampin -rifapentine -some medicines to treat HIV infection like atazanavir, indinavir, lopinavir, nelfinavir, tipranavir, ritonavir -St. John's wort -warfarin This list may not describe all possible interactions. Give your health care provider a list of all the medicines, herbs, non-prescription drugs, or dietary supplements you use. Also tell them if you smoke, drink alcohol, or use illegal drugs. Some items may interact with your medicine. What should I watch for while using this medicine? Visit your doctor or health care professional for regular check ups. See your doctor if you or your partner has sexual contact with others, becomes HIV positive, or gets a sexual transmitted disease. This product does not protect you against HIV infection (AIDS) or other sexually transmitted diseases. You can check the placement of the IUD yourself by reaching up to the top of your vagina with clean fingers to feel the threads. Do not pull on the threads. It is a good habit to check placement after each menstrual period. Call your doctor right away if you feel more of the IUD than just the threads or if you cannot feel the threads at all. The IUD may come out by itself. You may become pregnant if the device comes out. If you notice that the IUD has come out use a backup birth control method like condoms and call your health care provider. Using tampons will not change the position of the IUD and are okay to use during your period. What side effects may I notice  from receiving this medicine? Side effects that you should report to your doctor or  health care professional as soon as possible: -allergic reactions like skin rash, itching or hives, swelling of the face, lips, or tongue -fever, flu-like symptoms -genital sores -high blood pressure -no menstrual period for 6 weeks during use -pain, swelling, warmth in the leg -pelvic pain or tenderness -severe or sudden headache -signs of pregnancy -stomach cramping -sudden shortness of breath -trouble with balance, talking, or walking -unusual vaginal bleeding, discharge -yellowing of the eyes or skin Side effects that usually do not require medical attention (report to your doctor or health care professional if they continue or are bothersome): -acne -breast pain -change in sex drive or performance -changes in weight -cramping, dizziness, or faintness while the device is being inserted -headache -irregular menstrual bleeding within first 3 to 6 months of use -nausea This list may not describe all possible side effects. Call your doctor for medical advice about side effects. You may report side effects to FDA at 1-800-FDA-1088. Where should I keep my medicine? This does not apply. NOTE: This sheet is a summary. It may not cover all possible information. If you have questions about this medicine, talk to your doctor, pharmacist, or health care provider.  2015, Elsevier/Gold Standard. (2011-09-15 13:54:04)  Endometrial Ablation Endometrial ablation removes the lining of the uterus (endometrium). It is usually a same-day, outpatient treatment. Ablation helps avoid major surgery, such as surgery to remove the cervix and uterus (hysterectomy). After endometrial ablation, you will have little or no menstrual bleeding and may not be able to have children. However, if you are premenopausal, you will need to use a reliable method of birth control following the procedure because of the small chance that pregnancy can occur. There are different reasons to have this procedure, which  include:  Heavy periods.  Bleeding that is causing anemia.  Irregular bleeding.  Bleeding fibroids on the lining inside the uterus if they are smaller than 3 centimeters. This procedure should not be done if:  You want children in the future.  You have severe cramps with your menstrual period.  You have precancerous or cancerous cells in your uterus.  You were recently pregnant.  You have gone through menopause.  You have had major surgery on the uterus, such as a cesarean delivery. LET [facility] CARE PROVIDER KNOW ABOUT:  Any allergies you have.  All medicines you are taking, including vitamins, herbs, eye drops, creams, and over-the-counter medicines.  Previous problems you or members of your family have had with the use of anesthetics.  Any blood disorders you have.  Previous surgeries you have had.  Medical conditions you have. RISKS AND COMPLICATIONS  Generally, this is a safe procedure. However, as with any procedure, complications can occur. Possible complications include:  Perforation of the uterus.  Bleeding.  Infection of the uterus, bladder, or vagina.  Injury to surrounding organs.  An air bubble to the lung (air embolus).  Pregnancy following the procedure.  Failure of the procedure to help the problem, requiring hysterectomy.  Decreased ability to diagnose cancer in the lining of the uterus. BEFORE THE PROCEDURE  The lining of the uterus must be tested to make sure there is no pre-cancerous or cancer cells present.  An ultrasound may be performed to look at the size of the uterus and to check for abnormalities.  Medicines may be given to thin the lining of the uterus. PROCEDURE  During the procedure, your health care  provider will use a tool called a resectoscope to help see inside your uterus. There are different ways to remove the lining of your uterus.   Radiofrequency - This method uses a radiofrequency-alternating electric current to  remove the lining of the uterus.  Cryotherapy - This method uses extreme cold to freeze the lining of the uterus.  Heated-Free Liquid - This method uses heated salt (saline) solution to remove the lining of the uterus.  Microwave - This method uses high-energy microwaves to heat up the lining of the uterus to remove it.  Thermal balloon - This method involves inserting a catheter with a balloon tip into the uterus. The balloon tip is filled with heated fluid to remove the lining of the uterus. AFTER THE PROCEDURE  After your procedure, do not have sexual intercourse or insert anything into your vagina until permitted by your health care provider. After the procedure, you may experience:  Cramps.  Vaginal discharge.  Frequent urination. Document Released: 06/24/2004 Document Revised: 04/17/2013 Document Reviewed: 01/16/2013 [facility] Patient Information 2015 [place], [place]. This information is not intended to replace advice given to you by your health care provider. Make sure you discuss any questions you have with your health care provider. Hysterectomy Information  A hysterectomy is a surgery in which your uterus is removed. This surgery may be done to treat various medical problems. After the surgery, you will no longer have menstrual periods. The surgery will also make you unable to become pregnant (sterile). The fallopian tubes and ovaries can be removed (bilateral salpingo-oophorectomy) during this surgery as well.  REASONS FOR A HYSTERECTOMY  Persistent, abnormal bleeding.  Lasting (chronic) pelvic pain or infection.  The lining of the uterus (endometrium) starts growing outside the uterus (endometriosis).  The endometrium starts growing in the muscle of the uterus (adenomyosis).  The uterus falls down into the vagina (pelvic organ prolapse).  Noncancerous growths in the uterus (uterine fibroids) that cause symptoms.  Precancerous cells.  Cervical cancer or uterine  cancer. TYPES OF HYSTERECTOMIES  Supracervical hysterectomy--In this type, the top part of the uterus is removed, but not the cervix.  Total hysterectomy--The uterus and cervix are removed.  Radical hysterectomy--The uterus, the cervix, and the fibrous tissue that holds the uterus in place in the pelvis (parametrium) are removed. WAYS A HYSTERECTOMY CAN BE PERFORMED  Abdominal hysterectomy--A large surgical cut (incision) is made in the abdomen. The uterus is removed through this incision.  Vaginal hysterectomy--An incision is made in the vagina. The uterus is removed through this incision. There are no abdominal incisions.  Conventional laparoscopic hysterectomy--Three or four small incisions are made in the abdomen. A thin, lighted tube with a camera (laparoscope) is inserted into one of the incisions. Other tools are put through the other incisions. The uterus is cut into small pieces. The small pieces are removed through the incisions, or they are removed through the vagina.  Laparoscopically assisted vaginal hysterectomy (LAVH)--Three or four small incisions are made in the abdomen. Part of the surgery is performed laparoscopically and part vaginally. The uterus is removed through the vagina.  Robot-assisted laparoscopic hysterectomy--A laparoscope and other tools are inserted into 3 or 4 small incisions in the abdomen. A computer-controlled device is used to give the surgeon a 3D image and to help control the surgical instruments. This allows for more precise movements of surgical instruments. The uterus is cut into small pieces and removed through the incisions or removed through the vagina. RISKS AND COMPLICATIONS  Possible complications  associated with this procedure include:  Bleeding and risk of blood transfusion. Tell your health care provider if you do not want to receive any blood products.  Blood clots in the legs or lung.  Infection.  Injury to surrounding  organs.  Problems or side effects related to anesthesia.  Conversion to an abdominal hysterectomy from one of the other techniques. WHAT TO EXPECT AFTER A HYSTERECTOMY  You will be given pain medicine.  You will need to have someone with you for the first 3-5 days after you go home.  You will need to follow up with your surgeon in 2-4 weeks after surgery to evaluate your progress.  You may have early menopause symptoms such as hot flashes, night sweats, and insomnia.  If you had a hysterectomy for a problem that was not cancer or not a condition that could lead to cancer, then you no longer need Pap tests. However, even if you no longer need a Pap test, a regular exam is a good idea to make sure no other problems are starting. Document Released: 02/08/2001 Document Revised: 06/05/2013 Document Reviewed: 04/22/2013 [facility] Patient Information 2015 [place], [place]. This information is not intended to replace advice given to you by your health care provider. Make sure you discuss any questions you have with your health care provider.

## 2014-03-26 ENCOUNTER — Telehealth: Payer: Self-pay | Admitting: *Deleted

## 2014-03-26 NOTE — Telephone Encounter (Signed)
Message copied by Shelly Coss on Wed Mar 26, 2014  2:05 PM ------      Message from: Verita Schneiders A      Created: Wed Mar 26, 2014  1:48 PM       Benign endometrial biopsy. Please call to inform patient of results. ------

## 2014-03-26 NOTE — Telephone Encounter (Signed)
Called patient and a woman answered stating she wasn't in at the moment but would have her call us back

## 2014-03-27 NOTE — Telephone Encounter (Signed)
Called patient and informed her of results. Patient verbalized understanding and had no other questions

## 2014-04-23 ENCOUNTER — Encounter: Payer: Self-pay | Admitting: General Practice

## 2014-06-18 ENCOUNTER — Telehealth: Payer: Self-pay | Admitting: *Deleted

## 2014-06-18 NOTE — Telephone Encounter (Signed)
Attempted to contact patient concerning financial information for Mirena, no answer, left message for patient to return call to clinic.

## 2014-06-23 ENCOUNTER — Encounter: Payer: Self-pay | Admitting: *Deleted

## 2014-06-23 NOTE — Telephone Encounter (Signed)
Letter sent.

## 2014-06-30 ENCOUNTER — Encounter: Payer: Self-pay | Admitting: Obstetrics & Gynecology

## 2015-02-27 ENCOUNTER — Emergency Department (HOSPITAL_COMMUNITY)
Admission: EM | Admit: 2015-02-27 | Discharge: 2015-02-27 | Disposition: A | Discharge status: Home / Self Care | Payer: Medicaid Other | Attending: Emergency Medicine | Admitting: Emergency Medicine

## 2015-02-27 ENCOUNTER — Encounter (HOSPITAL_COMMUNITY): Payer: Self-pay | Admitting: *Deleted

## 2015-02-27 DIAGNOSIS — Z792 Long term (current) use of antibiotics: Secondary | ICD-10-CM | POA: Diagnosis not present

## 2015-02-27 DIAGNOSIS — R531 Weakness: Secondary | ICD-10-CM | POA: Diagnosis not present

## 2015-02-27 DIAGNOSIS — Z791 Long term (current) use of non-steroidal anti-inflammatories (NSAID): Secondary | ICD-10-CM | POA: Insufficient documentation

## 2015-02-27 DIAGNOSIS — H538 Other visual disturbances: Secondary | ICD-10-CM | POA: Insufficient documentation

## 2015-02-27 DIAGNOSIS — M7981 Nontraumatic hematoma of soft tissue: Secondary | ICD-10-CM | POA: Insufficient documentation

## 2015-02-27 DIAGNOSIS — R42 Dizziness and giddiness: Secondary | ICD-10-CM | POA: Diagnosis not present

## 2015-02-27 DIAGNOSIS — N390 Urinary tract infection, site not specified: Secondary | ICD-10-CM

## 2015-02-27 DIAGNOSIS — R200 Anesthesia of skin: Secondary | ICD-10-CM | POA: Diagnosis not present

## 2015-02-27 DIAGNOSIS — Z720 Tobacco use: Secondary | ICD-10-CM | POA: Diagnosis not present

## 2015-02-27 DIAGNOSIS — Z87442 Personal history of urinary calculi: Secondary | ICD-10-CM | POA: Diagnosis not present

## 2015-02-27 DIAGNOSIS — Z3202 Encounter for pregnancy test, result negative: Secondary | ICD-10-CM | POA: Insufficient documentation

## 2015-02-27 LAB — URINE MICROSCOPIC-ADD ON

## 2015-02-27 LAB — CBC WITH DIFFERENTIAL/PLATELET
Basophils Absolute: 0 10*3/uL (ref 0.0–0.1)
Basophils Relative: 0 % (ref 0–1)
Eosinophils Absolute: 0.4 10*3/uL (ref 0.0–0.7)
Eosinophils Relative: 6 % — ABNORMAL HIGH (ref 0–5)
HEMATOCRIT: 41.4 % (ref 36.0–46.0)
Hemoglobin: 14.4 g/dL (ref 12.0–15.0)
LYMPHS ABS: 2.6 10*3/uL (ref 0.7–4.0)
LYMPHS PCT: 38 % (ref 12–46)
MCH: 33.3 pg (ref 26.0–34.0)
MCHC: 34.8 g/dL (ref 30.0–36.0)
MCV: 95.8 fL (ref 78.0–100.0)
MONO ABS: 0.4 10*3/uL (ref 0.1–1.0)
MONOS PCT: 5 % (ref 3–12)
NEUTROS ABS: 3.4 10*3/uL (ref 1.7–7.7)
NEUTROS PCT: 51 % (ref 43–77)
PLATELETS: 223 10*3/uL (ref 150–400)
RBC: 4.32 MIL/uL (ref 3.87–5.11)
RDW: 13.9 % (ref 11.5–15.5)
WBC: 6.7 10*3/uL (ref 4.0–10.5)

## 2015-02-27 LAB — URINALYSIS, ROUTINE W REFLEX MICROSCOPIC
BILIRUBIN URINE: NEGATIVE
Glucose, UA: NEGATIVE mg/dL
Ketones, ur: NEGATIVE mg/dL
NITRITE: POSITIVE — AB
Protein, ur: NEGATIVE mg/dL
SPECIFIC GRAVITY, URINE: 1.015 (ref 1.005–1.030)
Urobilinogen, UA: 0.2 mg/dL (ref 0.0–1.0)
pH: 5.5 (ref 5.0–8.0)

## 2015-02-27 LAB — COMPREHENSIVE METABOLIC PANEL
ALK PHOS: 95 U/L (ref 38–126)
ALT: 11 U/L — AB (ref 14–54)
AST: 17 U/L (ref 15–41)
Albumin: 3.6 g/dL (ref 3.5–5.0)
Anion gap: 8 (ref 5–15)
BUN: 8 mg/dL (ref 6–20)
CO2: 23 mmol/L (ref 22–32)
CREATININE: 0.85 mg/dL (ref 0.44–1.00)
Calcium: 8.7 mg/dL — ABNORMAL LOW (ref 8.9–10.3)
Chloride: 107 mmol/L (ref 101–111)
Glucose, Bld: 82 mg/dL (ref 65–99)
POTASSIUM: 3.4 mmol/L — AB (ref 3.5–5.1)
SODIUM: 138 mmol/L (ref 135–145)
Total Bilirubin: 0.5 mg/dL (ref 0.3–1.2)
Total Protein: 5.9 g/dL — ABNORMAL LOW (ref 6.5–8.1)

## 2015-02-27 LAB — LIPASE, BLOOD: Lipase: 34 U/L (ref 22–51)

## 2015-02-27 LAB — POC URINE PREG, ED: Preg Test, Ur: NEGATIVE

## 2015-02-27 MED ORDER — SODIUM CHLORIDE 0.9 % IV BOLUS (SEPSIS)
1000.0000 mL | Freq: Once | INTRAVENOUS | Status: AC
  Administered 2015-02-27: 1000 mL via INTRAVENOUS

## 2015-02-27 MED ORDER — CEFTRIAXONE SODIUM 1 G IJ SOLR
1.0000 g | Freq: Once | INTRAMUSCULAR | Status: AC
  Administered 2015-02-27: 1 g via INTRAVENOUS
  Filled 2015-02-27: qty 10

## 2015-02-27 MED ORDER — CEPHALEXIN 500 MG PO CAPS: 500.0000 mg | ORAL_CAPSULE | Freq: Two times a day (BID) | ORAL | Status: AC

## 2015-02-27 NOTE — ED Notes (Signed)
Complains of urinary symptoms and lumps in arms. Confusion per visitor and blurry vision as well.

## 2015-02-27 NOTE — Discharge Instructions (Signed)
As discussed, it is important that you arrange appropriate follow-up care with your primary care physician. Please take all medication as directed, do not hesitate to return here if you develop new, or concerning changes in your condition.

## 2015-02-27 NOTE — ED Provider Notes (Signed)
CSN: [id number]     Arrival date & time 02/27/15  1728 History   First MD Initiated Contact with Patient 02/27/15 [place]     Chief Complaint  Patient presents with  . not feeling well      (Consider location/radiation/quality/duration/timing/severity/associated sxs/prior Treatment) HPI Patient presents with multiple concerns. Essentially she has concerns of both cutaneous changes in her right wrist, as well as generalized weakness, and neurologic complaints. She states that over the past 2 days she's developed, spontaneously, hematoma on the anterior wrist. There is mild tenderness to palpation, minimal soreness, but no loss of function of the hand, wrist, elbow. No fever, chills. Patient states that today she also developed episodic disequilibrium, and had speech difficulty, transiently had Currently the patient is neither of these complaints, states that she is comfortable. No similar prior issues. No clear precipitant, no clear alleviating or exacerbating factors. Patient does not drink, smokes.   Smoking cessation provided, particularly in light of this patient's evaluation in the ED.  Past Medical History  Diagnosis Date  . Abnormal Pap smear 2001    [facility]- told her that she had "pods" didnt get tx  . Kidney stones 2011, 2012  . DUB (dysfunctional uterine bleeding)    History reviewed. No pertinent past surgical history. Family History  Problem Relation Age of Onset  . Cancer Mother     breast  . Cancer Maternal Grandmother     breast, colon  . Diabetes Paternal Grandmother    History  Substance Use Topics  . Smoking status: Current Every Day Smoker -- 0.50 packs/day    Types: Cigarettes  . Smokeless tobacco: Never Used  . Alcohol Use: No   OB History    Gravida Para Term Preterm AB TAB SAB Ectopic Multiple Living   1 1  1  0 0 0 0 0 1     Review of Systems  Constitutional:       Per HPI, otherwise negative  HENT:       Per HPI, otherwise  negative  Eyes: Positive for visual disturbance.  Respiratory:       Per HPI, otherwise negative  Cardiovascular:       Per HPI, otherwise negative  Gastrointestinal: Negative for vomiting.  Endocrine:       Negative aside from HPI  Genitourinary:       Neg aside from HPI   Musculoskeletal:       Per HPI, otherwise negative  Skin: Negative.   Neurological: Positive for weakness, light-headedness and numbness. Negative for syncope and headaches.      Allergies  Review of patient's allergies indicates no known allergies.  Home Medications   Prior to Admission medications   Medication Sig Start Date End Date Taking? Authorizing Provider  aspirin-acetaminophen-caffeine (EXCEDRIN MIGRAINE) [phone] MG per tablet Take 1 tablet by mouth every 6 (six) hours as needed for headache.    Historical Provider, MD  cephALEXin (KEFLEX) 500 MG capsule Take 1 capsule (500 mg total) by mouth 4 (four) times daily. 02/18/14   Evelina Bucy, MD  HYDROcodone-acetaminophen (NORCO/VICODIN) 5-325 MG per tablet Take 1-2 tablets by mouth every 4 (four) hours as needed for moderate pain or severe pain. 03/02/14   Lucila [place], PA-C  ibuprofen (ADVIL,MOTRIN) 200 MG tablet Take 200 mg by mouth every 6 (six) hours as needed. For pain    Historical Provider, MD  ibuprofen (ADVIL,MOTRIN) 800 MG tablet Take 1 tablet (800 mg total) by mouth 3 (three) times daily.  03/02/14   Lucila [place], PA-C  megestrol (MEGACE) 20 MG tablet Take 2 tablets (40 mg total) by mouth daily. Take two tablets twice a day for heavy bleeding 03/24/14   Osborne Oman, MD   BP 117/89 mmHg  Pulse 84  Temp(Src) 98.8 F (37.1 C) (Oral)  Resp 18  Ht 5\' 4"  (1.626 m)  Wt 147 lb 3.2 oz (66.769 kg)  BMI 25.25 kg/m2  SpO2 99%  LMP 12/28/2014 Physical Exam  Constitutional: She is oriented to person, place, and time. She appears well-developed and well-nourished. No distress.  HENT:  Head: Normocephalic and atraumatic.  Eyes:  Conjunctivae and EOM are normal.  Cardiovascular: Normal rate and regular rhythm.   Pulmonary/Chest: Effort normal and breath sounds normal. No stridor. No respiratory distress.  Abdominal: She exhibits no distension.  Musculoskeletal: She exhibits no edema.  Neurological: She is alert and oriented to person, place, and time. She displays no atrophy and no tremor. No cranial nerve deficit or sensory deficit. She exhibits normal muscle tone. She displays no seizure activity. Coordination normal.  Skin: Skin is warm and dry.     Psychiatric: She has a normal mood and affect.  Nursing note and vitals reviewed.   ED Course  Procedures (including critical care time) Labs Review Labs Reviewed  CBC WITH DIFFERENTIAL/PLATELET - Abnormal; Notable for the following:    Eosinophils Relative 6 (*)    All other components within normal limits  COMPREHENSIVE METABOLIC PANEL - Abnormal; Notable for the following:    Potassium 3.4 (*)    Calcium 8.7 (*)    Total Protein 5.9 (*)    ALT 11 (*)    All other components within normal limits  URINALYSIS, ROUTINE W REFLEX MICROSCOPIC (NOT AT [facility]) - Abnormal; Notable for the following:    APPearance CLOUDY (*)    Hgb urine dipstick SMALL (*)    Nitrite POSITIVE (*)    Leukocytes, UA MODERATE (*)    All other components within normal limits  URINE MICROSCOPIC-ADD ON - Abnormal; Notable for the following:    Bacteria, UA MANY (*)    All other components within normal limits  LIPASE, BLOOD  POC URINE PREG, ED     Repeat exam the patient appears well, states understanding of all results.   9:12 PM On repeat exam the patient states that she feels substantially better, has no ongoing complaints. We discussed all findings, return precautions, follow-up instructions. MDM   Final diagnoses:  Dizziness  UTI (lower urinary tract infection)   patient presents with waxing/waning neurologic complaints, but also with ongoing generalized illness. Here  the patient has a reassuring neurologic exam, with no focal deficiency. She is afebrile, blood labs suggest urinary tract infection. Patient received fluid resuscitation, antibiotics, was monitored for several hours, with no decompensation. Patient had complete resolution of all symptoms, was discharged in stable condition.      Carmin Muskrat, MD 02/27/15 2113

## 2015-02-27 NOTE — ED Notes (Signed)
The pt reports that  She almost passed out at work earlier today  With lumps in her rt arm   And a  Rapid weight loss over the past 2 months.  Weight then was 180   .  sge reports that she just feels bad and she has not had a period for 2 months before then she was having vaginal bleeding all the time  lmp 2 months ago

## 2015-03-23 ENCOUNTER — Encounter: Payer: Self-pay | Admitting: Emergency Medicine

## 2015-03-23 ENCOUNTER — Emergency Department
Admission: EM | Admit: 2015-03-23 | Discharge: 2015-03-23 | Disposition: A | Discharge status: Home / Self Care | Payer: Medicaid Other | Attending: Emergency Medicine | Admitting: Emergency Medicine

## 2015-03-23 ENCOUNTER — Emergency Department: Payer: Medicaid Other

## 2015-03-23 DIAGNOSIS — Z3202 Encounter for pregnancy test, result negative: Secondary | ICD-10-CM | POA: Insufficient documentation

## 2015-03-23 DIAGNOSIS — N200 Calculus of kidney: Secondary | ICD-10-CM | POA: Diagnosis not present

## 2015-03-23 DIAGNOSIS — M545 Low back pain, unspecified: Secondary | ICD-10-CM

## 2015-03-23 DIAGNOSIS — Z720 Tobacco use: Secondary | ICD-10-CM | POA: Insufficient documentation

## 2015-03-23 DIAGNOSIS — Z79899 Other long term (current) drug therapy: Secondary | ICD-10-CM | POA: Diagnosis not present

## 2015-03-23 LAB — HEPATIC FUNCTION PANEL
ALBUMIN: 4.6 g/dL (ref 3.5–5.0)
ALT: 12 U/L — AB (ref 14–54)
AST: 21 U/L (ref 15–41)
Alkaline Phosphatase: 88 U/L (ref 38–126)
Bilirubin, Direct: <0.1 mg/dL — ABNORMAL LOW (ref 0.1–0.5)
TOTAL PROTEIN: 7.9 g/dL (ref 6.5–8.1)
Total Bilirubin: 0.6 mg/dL (ref 0.3–1.2)

## 2015-03-23 LAB — URINALYSIS COMPLETE WITH MICROSCOPIC (ARMC ONLY)
Bilirubin Urine: NEGATIVE
Glucose, UA: NEGATIVE mg/dL
Ketones, ur: NEGATIVE mg/dL
Nitrite: NEGATIVE
PH: 6 (ref 5.0–8.0)
Protein, ur: NEGATIVE mg/dL
SPECIFIC GRAVITY, URINE: 1.002 — AB (ref 1.005–1.030)

## 2015-03-23 LAB — CHLAMYDIA/NGC RT PCR (ARMC ONLY)
Chlamydia Tr: NOT DETECTED
N GONORRHOEAE: NOT DETECTED

## 2015-03-23 LAB — CBC
HCT: 52.7 % — ABNORMAL HIGH (ref 35.0–47.0)
Hemoglobin: 17.9 g/dL — ABNORMAL HIGH (ref 12.0–16.0)
MCH: 33.9 pg (ref 26.0–34.0)
MCHC: 34 g/dL (ref 32.0–36.0)
MCV: 99.6 fL (ref 80.0–100.0)
PLATELETS: 222 10*3/uL (ref 150–440)
RBC: 5.29 MIL/uL — AB (ref 3.80–5.20)
RDW: 14.2 % (ref 11.5–14.5)
WBC: 9.7 10*3/uL (ref 3.6–11.0)

## 2015-03-23 LAB — BASIC METABOLIC PANEL
ANION GAP: 7 (ref 5–15)
BUN: 11 mg/dL (ref 6–20)
CALCIUM: 9.3 mg/dL (ref 8.9–10.3)
CO2: 27 mmol/L (ref 22–32)
Chloride: 102 mmol/L (ref 101–111)
Creatinine, Ser: 0.97 mg/dL (ref 0.44–1.00)
GFR calc non Af Amer: >60 mL/min (ref >60)
Glucose, Bld: 86 mg/dL (ref 65–99)
Potassium: 3.9 mmol/L (ref 3.5–5.1)
SODIUM: 136 mmol/L (ref 135–145)

## 2015-03-23 LAB — PREGNANCY, URINE: Preg Test, Ur: NEGATIVE

## 2015-03-23 MED ORDER — KETOROLAC TROMETHAMINE 30 MG/ML IJ SOLN
30.0000 mg | Freq: Once | INTRAMUSCULAR | Status: AC
  Administered 2015-03-23: 30 mg via INTRAVENOUS
  Filled 2015-03-23: qty 1

## 2015-03-23 NOTE — ED Notes (Signed)
Low back pain for three days. Taking azo and not helping. Hurts all the way across her back. Pt with hx of kidney stones.

## 2015-03-23 NOTE — ED Provider Notes (Addendum)
[facility] Emergency Department Provider Note  ____________________________________________  Time seen: Approximately 3:58 PM  I have reviewed the triage vital signs and the nursing notes.   HISTORY  Chief Complaint Back Pain    HPI Brittany Herrera is a 44 y.o. female patient reports she had sudden onset of low back pain consistent with what she's had with previous kidney stone. She said she had one other kidney sounded felt different from this. [facility] went [facility] where she was diagnosed with UTI however she's taken all antibiotics and pains actually gotten worse she does not think it's UTI anymore she says it could be a kidney stone. She has no nausea vomiting fever chills or any other symptoms. The pain is in the low back about L4-5 area. Does not radiate. There is no abdominal pain.   Past Medical History  Diagnosis Date  . Abnormal Pap smear 2001    [place] [facility]- told her that she had "pods" didnt get tx  . Kidney stones 2011, 2012  . DUB (dysfunctional uterine bleeding)   . Kidney stones     Patient Active Problem List   Diagnosis Date Noted  . Anxiety 03/21/2013  . DUB (dysfunctional uterine bleeding) 01/07/2013    History reviewed. No pertinent past surgical history.  Current Outpatient Rx  Name  Route  Sig  Dispense  Refill  . ibuprofen (ADVIL,MOTRIN) 200 MG tablet   Oral   Take 200 mg by mouth every 6 (six) hours as needed for headache or mild pain.          Marland Kitchen Phenazopyridine HCl (AZO TABS PO)   Oral   Take 2 tablets by mouth 3 (three) times daily.         Marland Kitchen HYDROcodone-acetaminophen (NORCO/VICODIN) 5-325 MG per tablet   Oral   Take 1-2 tablets by mouth every 4 (four) hours as needed for moderate pain or severe pain. Patient not taking: Reported on 03/23/2015   6 tablet   0   . megestrol (MEGACE) 20 MG tablet   Oral   Take 2 tablets (40 mg total) by mouth daily. Take two tablets twice  a day for heavy bleeding Patient not taking: Reported on 03/23/2015   90 tablet   5     Allergies Review of patient's allergies indicates no known allergies.  Family History  Problem Relation Age of Onset  . Cancer Mother     breast  . Cancer Maternal Grandmother     breast, colon  . Diabetes Paternal Grandmother     Social History History  Substance Use Topics  . Smoking status: Current Every Day Smoker -- 0.50 packs/day    Types: Cigarettes  . Smokeless tobacco: Never Used  . Alcohol Use: No    Review of Systems Constitutional: No fever/chills Eyes: No visual changes. ENT: No sore throat. Cardiovascular: Denies chest pain. Respiratory: Denies shortness of breath. Gastrointestinal: No abdominal pain.  No nausea, no vomiting.  No diarrhea.  No constipation. Genitourinary: Negative for dysuria. Musculoskeletal: See history of present illness Skin: Negative for rash. Neurological: Negative for headaches, focal weakness or numbness.  10-point ROS otherwise negative.  ____________________________________________   PHYSICAL EXAM:  VITAL SIGNS: ED Triage Vitals  Enc Vitals Group     BP 03/23/15 1327 133/101 mmHg     Pulse Rate 03/23/15 1327 92     Resp 03/23/15 1327 18     Temp 03/23/15 1327 98.3 F (36.8 C)  Temp Source 03/23/15 1327 Oral     SpO2 03/23/15 1327 98 %     Weight 03/23/15 1327 135 lb (61.236 kg)     Height 03/23/15 1327 5\' 3"  (1.6 m)     Head Cir --      Peak Flow --      Pain Score 03/23/15 1335 7     Pain Loc --      Pain Edu? --      Excl. in [place]? --     Constitutional: Alert and oriented. Well appearing and in no acute distress. Eyes: Conjunctivae are normal. PERRL. EOMI. Head: Atraumatic. Nose: No congestion/rhinnorhea. Mouth/Throat: Mucous membranes are moist.  Oropharynx non-erythematous. Neck: No stridor. Cardiovascular: Normal rate, regular rhythm. Grossly normal heart sounds.  Good peripheral circulation. Respiratory:  Normal respiratory effort.  No retractions. Lungs CTAB. Gastrointestinal: Soft and nontender. No distention. No abdominal bruits. No CVA tenderness. Musculoskeletal: No lower extremity tenderness nor edema.  No joint effusions. Neurologic:  Normal speech and language. No gross focal neurologic deficits are appreciated. No gait instability. Skin:  Skin is warm, dry and intact. No rash noted. Psychiatric: Mood and affect are normal. Speech and behavior are normal.  ____________________________________________   LABS (all labs ordered are listed, but only abnormal results are displayed)  Labs Reviewed  URINALYSIS COMPLETEWITH MICROSCOPIC ([place]) - Abnormal; Notable for the following:    Color, Urine YELLOW (*)    APPearance CLEAR (*)    Specific Gravity, Urine 1.002 (*)    Hgb urine dipstick 1+ (*)    Leukocytes, UA 3+ (*)    Bacteria, UA RARE (*)    Squamous Epithelial / LPF 0-5 (*)    All other components within normal limits  CBC - Abnormal; Notable for the following:    RBC 5.29 (*)    Hemoglobin 17.9 (*)    HCT 52.7 (*)    All other components within normal limits  HEPATIC FUNCTION PANEL - Abnormal; Notable for the following:    ALT 12 (*)    Bilirubin, Direct <0.1 (*)    All other components within normal limits  CHLAMYDIA/NGC RT PCR ([facility] ONLY)  URINE CULTURE  BASIC METABOLIC PANEL  PREGNANCY, URINE   ____________________________________________  EKG   ____________________________________________  RADIOLOGY  CT scan shows staghorn calculus and kidney also has a stone in the other kidney no other apparent problems ____________________________________________   PROCEDURES    ____________________________________________   INITIAL IMPRESSION / ASSESSMENT AND PLAN / ED COURSE  Pertinent labs & imaging results that were available during my care of the patient were reviewed by me and considered in my medical decision making (see chart for  details).  Discussed with urologist on call who recommends following up with his partner Dr. Erlene Quan in [place] [phone] they have seen her before back in 2013 he reviewed the CT as well does not see anything else present either. He recommended not treating with antibiotics right now due to the presence of the staghorn calculus which will only generate resistance ____________________________________________   FINAL CLINICAL IMPRESSION(S) / ED DIAGNOSES  Final diagnoses:  Staghorn calculus  Bilateral low back pain without sciatica      Nena Polio, MD 03/23/15 2105 I should note straight leg raise was negative  Nena Polio, MD 03/23/15 2105

## 2015-03-23 NOTE — Progress Notes (Addendum)
   03/23/15 1700  Clinical Encounter Type  Visited With Patient and family together  Visit Type Spiritual support  Spiritual Encounters  Spiritual Needs Emotional  Stress Factors  Patient Stress Factors Health changes   Faith: Theist, believes in God but no faith tradition Status: alert and oriented, good spirits/ED Pyelonephritis and Renal Colic Age/Sex: 3 yrs female Family: Boyfriend bedside Visit Assessment: The patient shared that she has a 42 yr old and a 42 yr old. The patient shared that she is feeling better. She shared that she has strong family support. She says her 42 yr old is working. The chaplain gave encouraging words and introduced pastoral care.  Pastoral care can be reached via pager [phone] and by submitting an online request

## 2015-03-23 NOTE — ED Notes (Signed)
Patient reports being treated for UTI last week.  Patient prescribed cipro and last dose was this past Thursday.

## 2015-03-23 NOTE — Discharge Instructions (Signed)

## 2015-03-24 ENCOUNTER — Encounter: Payer: Self-pay | Admitting: Urology

## 2015-03-24 ENCOUNTER — Ambulatory Visit (INDEPENDENT_AMBULATORY_CARE_PROVIDER_SITE_OTHER): Payer: Medicaid Other | Admitting: Urology

## 2015-03-24 VITALS — BP 104/71 | HR 69

## 2015-03-24 DIAGNOSIS — N200 Calculus of kidney: Secondary | ICD-10-CM

## 2015-03-24 LAB — URINALYSIS, COMPLETE
Bilirubin, UA: NEGATIVE
Glucose, UA: NEGATIVE
Nitrite, UA: POSITIVE — AB
PH UA: 5.5 (ref 5.0–7.5)
Specific Gravity, UA: 1.02 (ref 1.005–1.030)
UUROB: 0.2 mg/dL (ref 0.2–1.0)

## 2015-03-24 LAB — MICROSCOPIC EXAMINATION
RBC, UA: NONE SEEN /hpf (ref 0–?)
WBC, UA: >30 /hpf — AB (ref 0–?)

## 2015-03-24 NOTE — Progress Notes (Signed)
03/24/2015 3:36 PM   Brittany Herrera 10-07-1970 [id number]  Referring provider: No referring provider defined for this encounter.  Chief Complaint  Patient presents with  . Nephrolithiasis    ER referral x 1 day ago, staghorn calculus noted on Ct scan     HPI:  1 - Recurrent Nephrolithiasis - pre 2016 Ureteroscopy x2. 02/2015 - left lower pole partial staghorn stone with some mild lower pole infundibular obstruciton. Total volume about 2cm, SSD 7cm, 1450HU. Only punctate contralateral stone  Today "Brittany Herrera" is seen as new patient for above. She is referred by [facility] ER. No interval fevers. Pain controlled.    PMH: Past Medical History  Diagnosis Date  . Abnormal Pap smear 2001    [place] [facility]- told her that she had "pods" didnt get tx  . Kidney stones 2011, 2012  . DUB (dysfunctional uterine bleeding)   . Kidney stones     Surgical History: No past surgical history on file.  Home Medications:    Medication List       This list is accurate as of: 03/24/15  3:36 PM.  Always use your most recent med list.               AZO TABS PO  Take 2 tablets by mouth 3 (three) times daily.     HYDROcodone-acetaminophen 5-325 MG per tablet  Commonly known as:  NORCO/VICODIN  Take 1-2 tablets by mouth every 4 (four) hours as needed for moderate pain or severe pain.     ibuprofen 200 MG tablet  Commonly known as:  ADVIL,MOTRIN  Take 200 mg by mouth every 6 (six) hours as needed for headache or mild pain.     megestrol 20 MG tablet  Commonly known as:  MEGACE  Take 2 tablets (40 mg total) by mouth daily. Take two tablets twice a day for heavy bleeding        Allergies: No Known Allergies  Family History: Family History  Problem Relation Age of Onset  . Cancer Mother     breast  . Cancer Maternal Grandmother     breast, colon  . Diabetes Paternal Grandmother     Social History:  reports that she has been smoking Cigarettes.  She has been smoking about  0.50 packs per day. She has never used smokeless tobacco. She reports that she does not drink alcohol or use illicit drugs.  ROS:   Urological Symptom Review  Patient is experiencing the following symptoms: Mild left sided flank pain   Review of Systems  Gastrointestinal (upper)  : Negative for upper GI symptoms  Gastrointestinal (lower) : Negative for lower GI symptoms  Constitutional : Negative for symptoms  Skin: Negative for skin symptoms  Eyes: Negative for eye symptoms  Ear/Nose/Throat : Negative for Ear/Nose/Throat symptoms  Hematologic/Lymphatic: Negative for Hematologic/Lymphatic symptoms  Cardiovascular : Negative for cardiovascular symptoms  Respiratory : Negative for respiratory symptoms  Endocrine: Negative for endocrine symptoms  Musculoskeletal: Negative for musculoskeletal symptoms  Neurological: Negative for neurological symptoms  Psychologic: Negative for psychiatric symptoms   Physical Exam: BP 104/71 mmHg  Pulse 69  LMP 03/16/2015 (Approximate)  Constitutional:  Alert and oriented, No acute distress. HEENT: [place] AT, moist mucus membranes.  Trachea midline, no masses. Cardiovascular: No clubbing, cyanosis, or edema. RRR. Respiratory: Normal respiratory effort, no increased work of breathing. GI: Abdomen is soft, nontender, nondistended, no abdominal masses GU: No CVA tenderness. Mild left CVAT.  Skin: No rashes, bruises or suspicious lesions.  Lymph: No cervical or inguinal adenopathy. Neurologic: Grossly intact, no focal deficits, moving all 4 extremities. Psychiatric: Normal mood and affect.  Laboratory Data: Lab Results  Component Value Date   WBC 9.7 03/23/2015   HGB 17.9* 03/23/2015   HCT 52.7* 03/23/2015   MCV 99.6 03/23/2015   PLT 222 03/23/2015    Lab Results  Component Value Date   CREATININE 0.97 03/23/2015    No results found for: PSA  No results found for: TESTOSTERONE  No results found for:  HGBA1C  Urinalysis    Component Value Date/Time   COLORURINE YELLOW* 03/23/2015 1338   APPEARANCEUR CLEAR* 03/23/2015 1338   LABSPEC 1.002* 03/23/2015 1338   PHURINE 6.0 03/23/2015 1338   GLUCOSEU NEGATIVE 03/23/2015 1338   HGBUR 1+* 03/23/2015 1338   BILIRUBINUR NEGATIVE 03/23/2015 1338   KETONESUR NEGATIVE 03/23/2015 1338   PROTEINUR NEGATIVE 03/23/2015 1338   UROBILINOGEN 0.2 02/27/2015 1745   NITRITE NEGATIVE 03/23/2015 1338   LEUKOCYTESUR 3+* 03/23/2015 1338    Pertinent Imaging: At per HPI  Assessment & Plan:   1 - Recurrent Nephrolithiasis - sig volume left sided stone. Discussed reccomended options of staged ureteroscpoy (two stages, each approx 90 min abotu 2 weeks apart with interval ureteral stenting) v. Percutaneous stone surgery (hopefully one stage, but more invasive and longer recovery and would need to stay at least 1 night in hospital. She opts for staged ureteroscopy and I agree. Will try to schedule first stage ASAP. She has a new job with [employer] and is trying to minimize time off work.  Risks, benefits, alternatives discussed in detail.   Also suggested possible metabolic eval and at least annual GU surveillance after treating curent stone burden and she is amenable.    Return in about 3 months (around 06/24/2015).  Alexis Frock, [facility] [address], [place], [place] [address] [phone]

## 2015-03-25 LAB — URINE CULTURE: Culture: >=100000

## 2015-03-26 ENCOUNTER — Telehealth: Payer: Self-pay

## 2015-03-26 NOTE — Telephone Encounter (Signed)
Scott from [facility] called stating pt urine cx came back pan sensitive. Pt is a Dr. Tresa Moore pt. Nicki Reaper stated he was under the impression pt was scheduled for a stent. Please advise.

## 2015-03-27 ENCOUNTER — Inpatient Hospital Stay: Admission: RE | Admit: 2015-03-27 | Source: Ambulatory Visit

## 2015-03-27 ENCOUNTER — Encounter: Payer: Self-pay | Admitting: *Deleted

## 2015-03-27 MED ORDER — CEPHALEXIN 500 MG PO CAPS: 500.0000 mg | ORAL_CAPSULE | Freq: Four times a day (QID) | ORAL | Status: DC

## 2015-03-27 NOTE — Patient Instructions (Signed)
  Your procedure is scheduled on: 04-01-15 Report to [place] To find out your arrival time please call [phone] between 1PM - 3PM on 03-31-15  Remember: Instructions that are not followed completely may result in serious medical risk, up to and including death, or upon the discretion of your surgeon and anesthesiologist your surgery may need to be rescheduled.    _X___ 1. Do not eat food or drink liquids after midnight. No gum chewing or hard candies.     _X___ 2. No Alcohol for 24 hours before or after surgery.   ____ 3. Bring all medications with you on the day of surgery if instructed.    ____ 4. Notify your doctor if there is any change in your medical condition     (cold, fever, infections).     Do not wear jewelry, make-up, hairpins, clips or nail polish.  Do not wear lotions, powders, or perfumes. You may wear deodorant.  Do not shave 48 hours prior to surgery. Men may shave face and neck.  Do not bring valuables to the hospital.    [facility] is not responsible for any belongings or valuables.               Contacts, dentures or bridgework may not be worn into surgery.  Leave your suitcase in the car. After surgery it may be brought to your room.  For patients admitted to the hospital, discharge time is determined by your treatment team.   Patients discharged the day of surgery will not be allowed to drive home.   Please read over the following fact sheets that you were given:     ____ Take these medicines the morning of surgery with A SIP OF WATER:    1. NONE  2.   3.   4.  5.  6.  ____ Fleet Enema (as directed)   ____ Use CHG Soap as directed  ____ Use inhalers on the day of surgery  ____ Stop metformin 2 days prior to surgery    ____ Take 1/2 of usual insulin dose the night before surgery and none on the morning of surgery.   ____ Stop Coumadin/Plavix/aspirin -N/A  _X___ Stop Anti-inflammatories-STOP MOTRIN NOW-NO NSAIDS  OR ASA PRODUCTS-TYLENOL OK   ____ Stop supplements until after surgery.    ____ Bring C-Pap to the hospital.

## 2015-03-27 NOTE — Telephone Encounter (Signed)
Please treat with Keflex 500 mg qid x 7.  [name], MD

## 2015-03-27 NOTE — Telephone Encounter (Signed)
Line busy

## 2015-03-30 NOTE — Telephone Encounter (Signed)
Line busy. Medication ordered.

## 2015-03-31 ENCOUNTER — Emergency Department (HOSPITAL_COMMUNITY): Payer: Medicaid Other

## 2015-03-31 ENCOUNTER — Emergency Department (HOSPITAL_COMMUNITY)
Admission: EM | Admit: 2015-03-31 | Discharge: 2015-03-31 | Disposition: A | Discharge status: Home / Self Care | Payer: Medicaid Other | Attending: Emergency Medicine | Admitting: Emergency Medicine

## 2015-03-31 ENCOUNTER — Telehealth: Payer: Self-pay

## 2015-03-31 ENCOUNTER — Encounter: Payer: Self-pay | Admitting: *Deleted

## 2015-03-31 ENCOUNTER — Encounter (HOSPITAL_COMMUNITY): Payer: Self-pay | Admitting: Emergency Medicine

## 2015-03-31 DIAGNOSIS — Z8742 Personal history of other diseases of the female genital tract: Secondary | ICD-10-CM | POA: Insufficient documentation

## 2015-03-31 DIAGNOSIS — Z87442 Personal history of urinary calculi: Secondary | ICD-10-CM | POA: Diagnosis not present

## 2015-03-31 DIAGNOSIS — Z862 Personal history of diseases of the blood and blood-forming organs and certain disorders involving the immune mechanism: Secondary | ICD-10-CM | POA: Insufficient documentation

## 2015-03-31 DIAGNOSIS — Z792 Long term (current) use of antibiotics: Secondary | ICD-10-CM | POA: Diagnosis not present

## 2015-03-31 DIAGNOSIS — Z720 Tobacco use: Secondary | ICD-10-CM | POA: Diagnosis not present

## 2015-03-31 DIAGNOSIS — N390 Urinary tract infection, site not specified: Secondary | ICD-10-CM | POA: Diagnosis not present

## 2015-03-31 DIAGNOSIS — Z79899 Other long term (current) drug therapy: Secondary | ICD-10-CM | POA: Diagnosis not present

## 2015-03-31 DIAGNOSIS — M549 Dorsalgia, unspecified: Secondary | ICD-10-CM | POA: Diagnosis present

## 2015-03-31 DIAGNOSIS — R109 Unspecified abdominal pain: Secondary | ICD-10-CM

## 2015-03-31 LAB — BASIC METABOLIC PANEL
Anion gap: 6 (ref 5–15)
BUN: 9 mg/dL (ref 6–20)
CALCIUM: 9.4 mg/dL (ref 8.9–10.3)
CO2: 26 mmol/L (ref 22–32)
Chloride: 105 mmol/L (ref 101–111)
Creatinine, Ser: 0.97 mg/dL (ref 0.44–1.00)
Glucose, Bld: 94 mg/dL (ref 65–99)
Potassium: 4.5 mmol/L (ref 3.5–5.1)
Sodium: 137 mmol/L (ref 135–145)

## 2015-03-31 LAB — URINALYSIS, ROUTINE W REFLEX MICROSCOPIC
Bilirubin Urine: NEGATIVE
GLUCOSE, UA: NEGATIVE mg/dL
Hgb urine dipstick: NEGATIVE
Ketones, ur: 15 mg/dL — AB
Nitrite: POSITIVE — AB
PH: 5.5 (ref 5.0–8.0)
PROTEIN: NEGATIVE mg/dL
Specific Gravity, Urine: 1.02 (ref 1.005–1.030)
UROBILINOGEN UA: 0.2 mg/dL (ref 0.0–1.0)

## 2015-03-31 LAB — URINE MICROSCOPIC-ADD ON

## 2015-03-31 LAB — CBC WITH DIFFERENTIAL/PLATELET
Basophils Absolute: 0 10*3/uL (ref 0.0–0.1)
Basophils Relative: 0 % (ref 0–1)
EOS PCT: 4 % (ref 0–5)
Eosinophils Absolute: 0.4 10*3/uL (ref 0.0–0.7)
HCT: 46.4 % — ABNORMAL HIGH (ref 36.0–46.0)
HEMOGLOBIN: 16.2 g/dL — AB (ref 12.0–15.0)
LYMPHS ABS: 2.1 10*3/uL (ref 0.7–4.0)
LYMPHS PCT: 21 % (ref 12–46)
MCH: 33.8 pg (ref 26.0–34.0)
MCHC: 34.9 g/dL (ref 30.0–36.0)
MCV: 96.7 fL (ref 78.0–100.0)
Monocytes Absolute: 0.5 10*3/uL (ref 0.1–1.0)
Monocytes Relative: 5 % (ref 3–12)
NEUTROS PCT: 70 % (ref 43–77)
Neutro Abs: 7.1 10*3/uL (ref 1.7–7.7)
Platelets: 218 10*3/uL (ref 150–400)
RBC: 4.8 MIL/uL (ref 3.87–5.11)
RDW: 13.5 % (ref 11.5–15.5)
WBC: 10.2 10*3/uL (ref 4.0–10.5)

## 2015-03-31 MED ORDER — OXYCODONE-ACETAMINOPHEN 5-325 MG PO TABS
ORAL_TABLET | ORAL | Status: AC
  Filled 2015-03-31: qty 1

## 2015-03-31 MED ORDER — ONDANSETRON HCL 4 MG/2ML IJ SOLN
4.0000 mg | Freq: Once | INTRAMUSCULAR | Status: AC
  Administered 2015-03-31: 4 mg via INTRAVENOUS
  Filled 2015-03-31: qty 2

## 2015-03-31 MED ORDER — OXYCODONE-ACETAMINOPHEN 5-325 MG PO TABS
1.0000 | ORAL_TABLET | Freq: Once | ORAL | Status: AC
  Administered 2015-03-31: 1 via ORAL

## 2015-03-31 MED ORDER — CEPHALEXIN 500 MG PO CAPS: 500.0000 mg | ORAL_CAPSULE | Freq: Four times a day (QID) | ORAL | Status: DC

## 2015-03-31 MED ORDER — MORPHINE SULFATE 4 MG/ML IJ SOLN
4.0000 mg | Freq: Once | INTRAMUSCULAR | Status: DC
  Filled 2015-03-31: qty 1

## 2015-03-31 NOTE — ED Notes (Signed)
Patient sent to x-ray by another staff member.

## 2015-03-31 NOTE — ED Provider Notes (Signed)
CSN: [id number]     Arrival date & time 03/31/15  1156 History   First MD Initiated Contact with Patient 03/31/15 1228     Chief Complaint  Patient presents with  . Flank Pain     (Consider location/radiation/quality/duration/timing/severity/associated sxs/prior Treatment) HPI  44 year old female presents with continued and worsening left back pain. She went to [place] about one week ago was diagnosed with a staghorn calculus in her left kidney. She was also found to have a UTI but is not currently on treatment. She is to be scheduled to have surgery, it was supposed to be yesterday but then was canceled and she does not know when is going to be rescheduled. Her urologist and [place] is Dr. [name]. The patient had worsening pain starting a few days ago and feels like the calculus has moved. She's having nausea without vomiting. Pain radiates from her back to her groin. There is a pain is severe. Has not been taking anything for pain She was told she was not allowed to that it would not be in her system while she's having the surgery. No fevers. Difficulty urinating but no dysuria.  Past Medical History  Diagnosis Date  . Abnormal Pap smear 2001    [facility] [place] [facility]- told her that she had "pods" didnt get tx  . Kidney stones 2011, 2012  . DUB (dysfunctional uterine bleeding)   . Kidney stones   . Anemia     ONLY WITH PREGNANCY  . Family history of adverse reaction to anesthesia     [place] [facility]   Past Surgical History  Procedure Laterality Date  . Lithotripsy     Family History  Problem Relation Age of Onset  . Cancer Mother     breast  . Cancer Maternal Grandmother     breast, colon  . Diabetes Paternal Grandmother    History  Substance Use Topics  . Smoking status: Current Every Day Smoker -- 0.50 packs/day for 15 years    Types: Cigarettes  . Smokeless tobacco: Never Used  . Alcohol Use: Yes     Comment: RARE   OB History    Gravida Para Term  Preterm AB TAB SAB Ectopic Multiple Living   1 1  1  0 0 0 0 0 1     Review of Systems  Constitutional: Negative for fever.  Gastrointestinal: Positive for nausea. Negative for vomiting.  Genitourinary: Positive for flank pain and difficulty urinating. Negative for dysuria.  Musculoskeletal: Positive for back pain.  All other systems reviewed and are negative.     Allergies  Review of patient's allergies indicates no known allergies.  Home Medications   Prior to Admission medications   Medication Sig Start Date End Date Taking? Authorizing Provider  cephALEXin (KEFLEX) 500 MG capsule Take 1 capsule (500 mg total) by mouth 4 (four) times daily. 03/27/15   [name], MD  HYDROcodone-acetaminophen (NORCO/VICODIN) 5-325 MG per tablet Take 1-2 tablets by mouth every 4 (four) hours as needed for moderate pain or severe pain. Patient not taking: Reported on 03/23/2015 03/02/14   Lucila Maine, PA-C  ibuprofen (ADVIL,MOTRIN) 200 MG tablet Take 200 mg by mouth every 6 (six) hours as needed for headache or mild pain.     Historical Provider, MD  megestrol (MEGACE) 20 MG tablet Take 2 tablets (40 mg total) by mouth daily. Take two tablets twice a day for heavy bleeding Patient not taking: Reported on 03/24/2015 03/24/14   Laray Anger A  Anyanwu, MD  Phenazopyridine HCl (AZO TABS PO) Take 2 tablets by mouth 3 (three) times daily.    Historical Provider, MD   BP 160/145 mmHg  Pulse 101  Temp(Src) 97.8 F (36.6 C) (Oral)  Resp 18  SpO2 99%  LMP 03/16/2015 (Approximate) Physical Exam  Constitutional: She is oriented to person, place, and time. She appears well-developed and well-nourished. No distress.  HENT:  Head: Normocephalic and atraumatic.  Right Ear: External ear normal.  Left Ear: External ear normal.  Nose: Nose normal.  Eyes: Right eye exhibits no discharge. Left eye exhibits no discharge.  Cardiovascular: Normal rate, regular rhythm and normal heart sounds.   Pulmonary/Chest:  Effort normal and breath sounds normal.  Abdominal: Soft. She exhibits no distension. There is no tenderness. There is CVA tenderness (left, moderate).  Neurological: She is alert and oriented to person, place, and time.  Skin: Skin is warm and dry. She is not diaphoretic.  Nursing note and vitals reviewed.   ED Course  Procedures (including critical care time) Labs Review Labs Reviewed  URINALYSIS, ROUTINE W REFLEX MICROSCOPIC (NOT AT [facility]) - Abnormal; Notable for the following:    Color, Urine AMBER (*)    APPearance CLOUDY (*)    Ketones, ur 15 (*)    Nitrite POSITIVE (*)    Leukocytes, UA LARGE (*)    All other components within normal limits  CBC WITH DIFFERENTIAL/PLATELET - Abnormal; Notable for the following:    Hemoglobin 16.2 (*)    HCT 46.4 (*)    All other components within normal limits  URINE MICROSCOPIC-ADD ON - Abnormal; Notable for the following:    Squamous Epithelial / LPF MANY (*)    Bacteria, UA MANY (*)    All other components within normal limits  BASIC METABOLIC PANEL    Imaging Review Dg Abd 1 View  03/31/2015   CLINICAL DATA:  44 year old female with left-sided staghorn calculus. Subsequent encounter.  EXAM: ABDOMEN - 1 VIEW  COMPARISON:  03/23/2015.  FINDINGS: Left lower pole renal calculi appear similar to the prior CT scout view with largest stone measuring up to 1.3 cm.  Punctate inferior pole right renal calculi.  Left pelvic phlebolith. Question tiny left distal ureteral calculi versus phlebolith.  Sclerotic focus left ilium unchanged and may be a bone island.  No plain film evidence of bowel obstruction.  IMPRESSION: Left lower pole renal calculi appear similar to the prior CT scout view with largest stone measuring up to 1.3 cm.  Punctate inferior pole right renal calculi.  Left pelvic phlebolith. Question tiny left distal ureteral calculi versus phlebolith.   Electronically Signed   By: Genia Del M.D.   On: 03/31/2015 13:51     EKG  Interpretation None      MDM   Final diagnoses:  Left flank pain  UTI (lower urinary tract infection)    I discussed the patient with her urologist, Dr. Erlene Quan. Dr. Erlene Quan states that they have been trying to call the patient because she needs to have a UTI treated before they will remove this staghorn renal calculus. The patient keeps claiming that they have not received any phone calls except for this morning when they talked. Dr. Erlene Quan is suggesting Keflex 500 mg 4 times a day for 7 days and the patient is scheduled for her surgery on 8/10. I relayed this to the patient. She is allowed to take pain medicine just no NSAIDs as this could complicate the surgery. The patient does not  want any narcotics at this time and will be discharge with anti-biotics.    Sherwood Gambler, MD 03/31/15 [phone]

## 2015-03-31 NOTE — Telephone Encounter (Signed)
Spoke with pt in reference to abt. Pt stated she has not started medication. Per Dr. Erlene Quan pt surgery has to be rescheduled due to infection. Pt voiced understanding.

## 2015-03-31 NOTE — Telephone Encounter (Signed)
Pt mother, Pamala Hurry, called stating pt is in a lot of pain and currently at the ER. Pamala Hurry also was questioning why she was not contacted as a back up when pt was not able to be reached. Nurse made mother aware her phone number was not in the computer nor was in put our system as back up. Pamala Hurry voiced her frustration and anger. Nurse also made mother aware pt "no showed" pre-op appt. Mother again voiced anger. At this point nurse gave pt new surgery date, pre-op number, and number to call the day before surgery for arrival time. Mother and pt voiced understanding.

## 2015-03-31 NOTE — ED Notes (Signed)
Pt sts left flank and back pain; pt sts known kidney stone and infection and still having pain; pt sts supposed to have sx tomorrow

## 2015-04-08 ENCOUNTER — Ambulatory Visit: Payer: Medicaid Other | Admitting: Anesthesiology

## 2015-04-08 ENCOUNTER — Encounter
Admission: RE | Disposition: A | Discharge status: Home / Self Care | Payer: Self-pay | Source: Ambulatory Visit | Attending: Urology

## 2015-04-08 ENCOUNTER — Ambulatory Visit
Admission: RE | Admit: 2015-04-08 | Discharge: 2015-04-08 | Disposition: A | Discharge status: Home / Self Care | Payer: Medicaid Other | Source: Ambulatory Visit | Attending: Urology | Admitting: Urology

## 2015-04-08 ENCOUNTER — Encounter: Payer: Self-pay | Admitting: Anesthesiology

## 2015-04-08 DIAGNOSIS — Z791 Long term (current) use of non-steroidal anti-inflammatories (NSAID): Secondary | ICD-10-CM | POA: Diagnosis not present

## 2015-04-08 DIAGNOSIS — Z800 Family history of malignant neoplasm of digestive organs: Secondary | ICD-10-CM | POA: Insufficient documentation

## 2015-04-08 DIAGNOSIS — N200 Calculus of kidney: Secondary | ICD-10-CM | POA: Diagnosis not present

## 2015-04-08 DIAGNOSIS — F17210 Nicotine dependence, cigarettes, uncomplicated: Secondary | ICD-10-CM | POA: Insufficient documentation

## 2015-04-08 DIAGNOSIS — Z803 Family history of malignant neoplasm of breast: Secondary | ICD-10-CM | POA: Diagnosis not present

## 2015-04-08 DIAGNOSIS — Z833 Family history of diabetes mellitus: Secondary | ICD-10-CM | POA: Diagnosis not present

## 2015-04-08 DIAGNOSIS — Z87442 Personal history of urinary calculi: Secondary | ICD-10-CM | POA: Diagnosis not present

## 2015-04-08 DIAGNOSIS — Z79891 Long term (current) use of opiate analgesic: Secondary | ICD-10-CM | POA: Insufficient documentation

## 2015-04-08 HISTORY — DX: Family history of other specified conditions: Z84.89

## 2015-04-08 HISTORY — PX: CYSTOSCOPY/URETEROSCOPY/HOLMIUM LASER/STENT PLACEMENT: SHX6546

## 2015-04-08 HISTORY — DX: Anemia, unspecified: D64.9

## 2015-04-08 LAB — URINE DRUG SCREEN, QUALITATIVE (ARMC ONLY)
Amphetamines, Ur Screen: NOT DETECTED
BARBITURATES, UR SCREEN: NOT DETECTED
Benzodiazepine, Ur Scrn: NOT DETECTED
Cannabinoid 50 Ng, Ur ~~LOC~~: POSITIVE — AB
Cocaine Metabolite,Ur ~~LOC~~: NOT DETECTED
MDMA (Ecstasy)Ur Screen: NOT DETECTED
METHADONE SCREEN, URINE: NOT DETECTED
Opiate, Ur Screen: NOT DETECTED
Phencyclidine (PCP) Ur S: NOT DETECTED
Tricyclic, Ur Screen: NOT DETECTED

## 2015-04-08 LAB — POCT PREGNANCY, URINE: Preg Test, Ur: NEGATIVE

## 2015-04-08 SURGERY — CYSTOSCOPY/URETEROSCOPY/HOLMIUM LASER/STENT PLACEMENT
Anesthesia: General | Laterality: Left | Wound class: Clean Contaminated

## 2015-04-08 MED ORDER — NEOSTIGMINE METHYLSULFATE 10 MG/10ML IV SOLN
INTRAVENOUS | Status: DC | PRN
  Administered 2015-04-08: 3 mg via INTRAVENOUS

## 2015-04-08 MED ORDER — GLYCOPYRROLATE 0.2 MG/ML IJ SOLN
INTRAMUSCULAR | Status: DC | PRN
  Administered 2015-04-08: 0.6 mg via INTRAVENOUS
  Administered 2015-04-08: 0.2 mg via INTRAVENOUS

## 2015-04-08 MED ORDER — FAMOTIDINE 20 MG PO TABS
ORAL_TABLET | ORAL | Status: AC
  Administered 2015-04-08: 20 mg via ORAL
  Filled 2015-04-08: qty 1

## 2015-04-08 MED ORDER — LIDOCAINE HCL (CARDIAC) 20 MG/ML IV SOLN
INTRAVENOUS | Status: DC | PRN
  Administered 2015-04-08: 30 mg via INTRAVENOUS

## 2015-04-08 MED ORDER — OXYBUTYNIN CHLORIDE 5 MG PO TABS: 5.0000 mg | ORAL_TABLET | Freq: Three times a day (TID) | ORAL | Status: DC | PRN

## 2015-04-08 MED ORDER — GENTAMICIN SULFATE 40 MG/ML IJ SOLN: 80.0000 mg | Freq: Once | INTRAVENOUS | Status: DC

## 2015-04-08 MED ORDER — ROCURONIUM BROMIDE 100 MG/10ML IV SOLN
INTRAVENOUS | Status: DC | PRN
  Administered 2015-04-08: 10 mg via INTRAVENOUS
  Administered 2015-04-08: 30 mg via INTRAVENOUS

## 2015-04-08 MED ORDER — FENTANYL CITRATE (PF) 100 MCG/2ML IJ SOLN: 25.0000 ug | INTRAMUSCULAR | Status: DC | PRN

## 2015-04-08 MED ORDER — GENTAMICIN IN SALINE 0.8-0.9 MG/ML-% IV SOLN
80.0000 mg | Freq: Once | INTRAVENOUS | Status: DC
  Filled 2015-04-08: qty 100

## 2015-04-08 MED ORDER — LACTATED RINGERS IV SOLN
INTRAVENOUS | Status: DC
  Administered 2015-04-08 (×2): via INTRAVENOUS

## 2015-04-08 MED ORDER — TAMSULOSIN HCL 0.4 MG PO CAPS: 0.4000 mg | ORAL_CAPSULE | Freq: Every day | ORAL | Status: DC

## 2015-04-08 MED ORDER — SODIUM CHLORIDE 0.9 % IV SOLN
1.0000 g | Freq: Once | INTRAVENOUS | Status: AC
  Administered 2015-04-08: 11:00:00 via INTRAVENOUS
  Administered 2015-04-08 (×2): 500 mg via INTRAVENOUS
  Filled 2015-04-08: qty 1000

## 2015-04-08 MED ORDER — SODIUM CHLORIDE 0.9 % IJ SOLN
INTRAMUSCULAR | Status: AC
  Filled 2015-04-08: qty 10

## 2015-04-08 MED ORDER — FENTANYL CITRATE (PF) 100 MCG/2ML IJ SOLN
INTRAMUSCULAR | Status: DC | PRN
  Administered 2015-04-08 (×2): 50 ug via INTRAVENOUS

## 2015-04-08 MED ORDER — HYDROCODONE-ACETAMINOPHEN 5-325 MG PO TABS: 1.0000 | ORAL_TABLET | ORAL | Status: DC | PRN

## 2015-04-08 MED ORDER — FAMOTIDINE 20 MG PO TABS
20.0000 mg | ORAL_TABLET | Freq: Once | ORAL | Status: AC
  Administered 2015-04-08: 20 mg via ORAL

## 2015-04-08 MED ORDER — ONDANSETRON HCL 4 MG/2ML IJ SOLN
INTRAMUSCULAR | Status: DC | PRN
  Administered 2015-04-08: 4 mg via INTRAVENOUS

## 2015-04-08 MED ORDER — DEXTROSE 5 % IV SOLN
80.0000 mg | Freq: Once | INTRAVENOUS | Status: DC
  Administered 2015-04-08: 80 mg via INTRAVENOUS
  Filled 2015-04-08: qty 2

## 2015-04-08 MED ORDER — OXYCODONE HCL 5 MG/5ML PO SOLN
5.0000 mg | Freq: Once | ORAL | Status: DC | PRN
Start: 2015-04-08 — End: 2015-04-08

## 2015-04-08 MED ORDER — MIDAZOLAM HCL 2 MG/2ML IJ SOLN
INTRAMUSCULAR | Status: DC | PRN
  Administered 2015-04-08 (×2): 1 mg via INTRAVENOUS

## 2015-04-08 MED ORDER — PROMETHAZINE HCL 25 MG/ML IJ SOLN
INTRAMUSCULAR | Status: AC
  Administered 2015-04-08: 12.5 mg via INTRAVENOUS
  Filled 2015-04-08: qty 1

## 2015-04-08 MED ORDER — PROMETHAZINE HCL 25 MG/ML IJ SOLN
6.2500 mg | INTRAMUSCULAR | Status: DC | PRN
  Administered 2015-04-08: 12.5 mg via INTRAVENOUS

## 2015-04-08 MED ORDER — SODIUM CHLORIDE 0.9 % IV SOLN
INTRAVENOUS | Status: DC | PRN
  Administered 2015-04-08: 30 mL

## 2015-04-08 MED ORDER — OXYCODONE HCL 5 MG PO TABS: 5.0000 mg | ORAL_TABLET | Freq: Once | ORAL | Status: DC | PRN

## 2015-04-08 MED ORDER — SODIUM CHLORIDE 0.9 % IR SOLN
Status: DC | PRN
  Administered 2015-04-08: 3000 mL

## 2015-04-08 MED ORDER — PROPOFOL 10 MG/ML IV BOLUS
INTRAVENOUS | Status: DC | PRN
  Administered 2015-04-08: 150 mg via INTRAVENOUS
  Administered 2015-04-08: 30 mg via INTRAVENOUS

## 2015-04-08 SURGICAL SUPPLY — 35 items
ADAPTER SCOPE UROLOK II (MISCELLANEOUS) ×1 IMPLANT
ADPR INSRT BALL FIT URLK2 (MISCELLANEOUS)
BAG DRAIN CYSTO-URO LG1000N (MISCELLANEOUS) ×2 IMPLANT
BASKET ZERO TIP 1.9FR (BASKET) ×2 IMPLANT
BSKT STON RTRVL ZERO TP 1.9FR (BASKET) ×1
CATH URETL 5X70 OPEN END (CATHETERS) ×2 IMPLANT
CNTNR SPEC 2.5X3XGRAD LEK (MISCELLANEOUS) ×1
CONRAY 43 FOR UROLOGY 50M (MISCELLANEOUS) ×1 IMPLANT
CONRAY 50ML (MISCELLANEOUS) ×1 IMPLANT
CONT SPEC 4OZ STER OR WHT (MISCELLANEOUS) ×1
CONT SPEC 4OZ STRL OR WHT (MISCELLANEOUS) ×1
CONTAINER SPEC 2.5X3XGRAD LEK (MISCELLANEOUS) ×1 IMPLANT
FEE TECHNICIAN ONLY PER HOUR (MISCELLANEOUS) ×1 IMPLANT
GLOVE BIO SURGEON STRL SZ 6.5 (GLOVE) ×2 IMPLANT
GLOVE BIO SURGEON STRL SZ7 (GLOVE) ×4 IMPLANT
GOWN STRL REUS W/ TWL LRG LVL3 (GOWN DISPOSABLE) ×2 IMPLANT
GOWN STRL REUS W/TWL LRG LVL3 (GOWN DISPOSABLE) ×4
INTRODUCER DILATOR DOUBLE (INTRODUCER) ×1 IMPLANT
JELLY LUB 2OZ STRL (MISCELLANEOUS) ×1
JELLY LUBE 2OZ STRL (MISCELLANEOUS) ×1 IMPLANT
KIT RM TURNOVER CYSTO AR (KITS) ×2 IMPLANT
LASER HOLMIUM FIBER SU 272UM (MISCELLANEOUS) ×1 IMPLANT
PACK CYSTO AR (MISCELLANEOUS) ×2 IMPLANT
PREP PVP WINGED SPONGE (MISCELLANEOUS) ×2 IMPLANT
PUMP SINGLE ACTION SAP (PUMP) ×1 IMPLANT
SENSORWIRE 0.038 NOT ANGLED (WIRE) ×4
SET CYSTO W/LG BORE CLAMP LF (SET/KITS/TRAYS/PACK) ×2 IMPLANT
SHEATH URETERAL 12FRX35CM (MISCELLANEOUS) ×2 IMPLANT
SOL .9 NS 3000ML IRR  AL (IV SOLUTION) ×1
SOL .9 NS 3000ML IRR AL (IV SOLUTION) ×1
SOL .9 NS 3000ML IRR UROMATIC (IV SOLUTION) ×1 IMPLANT
STENT URET 6FRX24 CONTOUR (STENTS) ×2 IMPLANT
STENT URET 6FRX26 CONTOUR (STENTS) ×1 IMPLANT
WATER STERILE IRR 1000ML POUR (IV SOLUTION) ×2 IMPLANT
WIRE SENSOR 0.038 NOT ANGLED (WIRE) ×2 IMPLANT

## 2015-04-08 NOTE — Op Note (Signed)
Date of procedure: 04/08/2015  Preoperative diagnosis:  1. Left kidney stones   Postoperative diagnosis:  1. Left kidney stones   Procedure: 1. Left ureteroscopy, laser lithotripsy 2. Left retrograde pyelogram 3. Left basket extraction of Stone fragment 4. Left ureteral stent placement  Surgeon: [name], MD  Anesthesia: General  Complications: None  Intraoperative findings: Large left lower pole stone burden greater than 2 cm, approximately 90% of stone cleared  EBL: Minimal  Specimens: Stone fragment  Drains: 6 x 24 French double-J ureteral stent on left  Indication: Brittany Herrera is a 44 y.o. patient with significant left lower pole stone burden.  After reviewing the management options for treatment, he elected to proceed with the above surgical procedure(s).  We'll plan for staged procedure given the significant size and difficult location of stones. We have discussed the potential benefits and risks of the procedure, side effects of the proposed treatment, the likelihood of the patient achieving the goals of the procedure, and any potential problems that might occur during the procedure or recuperation. Informed consent has been obtained.  Of note, she was treated appropriately for preoperative positive urine culture.  Description of procedure:  The patient was taken to the operating room and general anesthesia was induced.  The patient was placed in the dorsal lithotomy position, prepped and draped in the usual sterile fashion, and preoperative antibiotics were administered in the form of IV gentamicin and ampicillin. A preoperative time-out was performed.   At this point time, a rigid 20 French cystoscope was advanced per urethra into the bladder.  Of note, she did have some metaplasia of the bladder neck extending to the trigone which appeared to be benign.  Attention was then turned to the left ureteral orifice which was cannulated using a 5 [place] open-ended  ureteral catheter and a sensor wire up to level of the kidney. A second sensor wire was introduced and one was snapped in place as a safety wire, the other use as a working wire.  A Cook ureteral access sheath was then advanced over the working wire up to level of the proximal ureter without difficulty under fluoroscopic guidance. The inner cannula was removed. A flexible ureteroscope was then brought in and the lower pole stones were identified. The upper and mid pole calyces were directly visualized and there is no evidence of stone in these locations. A 200  laser fiber was then brought in and using initially the settings of 0.2 J and 50 Hz to attempt to dust the stone and later on 1 J and 15 Hz to further fragment the stone, the stone was fragmented over. Of approximately 1.5 hours. There was excellent fragmentation noted on fluoroscopy. A significant portion of the stone was removed via basket extraction using a 1.9 [place] to plus nitinol basket. There was a significant amount of stone debris and visualization was relatively poor near the end of the case. There did not appear to be any significant size stones remaining, however, given the overall stone burden, I did go ahead and plan to return at a later date for staged procedure to clear the residual stone fragments.  Retrograde pyelogram was performed by injecting contrast through the scope with the scope located within the proximal ureter outlining the collecting system which was decompressed without significant filling defects or urinary extravasation. The scope was backed down the length of the ureter under direct visualization removing the access sheath along the way. There is no evidence of ureteral injury, ureteral  fragments, or significant ureteral edema. A 6 x 24 French double-J ureteral stent was advanced over the safety wire up to level of the kidney.  The wire was partially drawn and a coil was noted within the renal pelvis. The wire was then  fully withdrawn and a coil was noted within the bladder. The cystoscope was reintroduced and the bladder was drained. A full coil was noted within the bladder. The scope was then removed. The patient was then repositioned in the supine position, reversed from anesthesia, and taken to the PACU in stable condition. There are no complications in this case.  Plan: Patient will return in approximate 2 weeks to ensure no residual significant stone burden for planned stage procedure.  [name], M.D.

## 2015-04-08 NOTE — Anesthesia Preprocedure Evaluation (Signed)
Anesthesia Evaluation  Patient identified by MRN, date of birth, ID band Patient awake    Reviewed: Allergy & Precautions, H&P , NPO status , Patient's Chart, lab work & pertinent test results  History of Anesthesia Complications Negative for: history of anesthetic complications  Airway Mallampati: II  TM Distance: >3 FB Neck ROM: full    Dental  (+) Teeth Intact, Poor Dentition, Chipped, Missing   Pulmonary Current Smoker,  breath sounds clear to auscultation  Pulmonary exam normal       Cardiovascular negative cardio ROS Normal cardiovascular examRhythm:regular Rate:Normal     Neuro/Psych negative neurological ROS  negative psych ROS   GI/Hepatic negative GI ROS, Neg liver ROS,   Endo/Other  negative endocrine ROS  Renal/GU Renal disease  negative genitourinary   Musculoskeletal   Abdominal   Peds  Hematology negative hematology ROS (+)   Anesthesia Other Findings Past Medical History:   Abnormal Pap smear                              2001           Comment:[facility]- told her that she had               "pods" didnt get tx   Kidney stones                                   2011, 2012   DUB (dysfunctional uterine bleeding)                         Kidney stones                                                Anemia                                                         Comment:ONLY WITH PREGNANCY   Family history of adverse reaction to anesthes*                Comment:MOM-HAS HARD TIME WAKING UP   Reproductive/Obstetrics negative OB ROS                             Anesthesia Physical Anesthesia Plan  ASA: II  Anesthesia Plan: General ETT   Post-op Pain Management:    Induction:   Airway Management Planned:   Additional Equipment:   Intra-op Plan:   Post-operative Plan:   Informed Consent: I have reviewed the patients History and Physical, chart, labs and  discussed the procedure including the risks, benefits and alternatives for the proposed anesthesia with the patient or authorized representative who has indicated his/her understanding and acceptance.   Dental Advisory Given  Plan Discussed with: Anesthesiologist, CRNA and Surgeon  Anesthesia Plan Comments:         Anesthesia Quick Evaluation

## 2015-04-08 NOTE — H&P (View-Only) (Signed)
03/24/2015 3:36 PM   Norberta Keens 06/30/1971 [id number]  Referring provider: No referring provider defined for this encounter.  Chief Complaint  Patient presents with  . Nephrolithiasis    ER referral x 1 day ago, staghorn calculus noted on Ct scan     HPI:  1 - Recurrent Nephrolithiasis - pre 2016 Ureteroscopy x2. 02/2015 - left lower pole partial staghorn stone with some mild lower pole infundibular obstruciton. Total volume about 2cm, SSD 7cm, 1450HU. Only punctate contralateral stone  Today "[name]" is seen as new patient for above. She is referred by [facility] ER. No interval fevers. Pain controlled.    PMH: Past Medical History  Diagnosis Date  . Abnormal Pap smear 2001    [facility]- told her that she had "pods" didnt get tx  . Kidney stones 2011, 2012  . DUB (dysfunctional uterine bleeding)   . Kidney stones     Surgical History: No past surgical history on file.  Home Medications:    Medication List       This list is accurate as of: 03/24/15  3:36 PM.  Always use your most recent med list.               AZO TABS PO  Take 2 tablets by mouth 3 (three) times daily.     HYDROcodone-acetaminophen 5-325 MG per tablet  Commonly known as:  NORCO/VICODIN  Take 1-2 tablets by mouth every 4 (four) hours as needed for moderate pain or severe pain.     ibuprofen 200 MG tablet  Commonly known as:  ADVIL,MOTRIN  Take 200 mg by mouth every 6 (six) hours as needed for headache or mild pain.     megestrol 20 MG tablet  Commonly known as:  MEGACE  Take 2 tablets (40 mg total) by mouth daily. Take two tablets twice a day for heavy bleeding        Allergies: No Known Allergies  Family History: Family History  Problem Relation Age of Onset  . Cancer Mother     breast  . Cancer Maternal Grandmother     breast, colon  . Diabetes Paternal Grandmother     Social History:  reports that she has been smoking Cigarettes.  She has been smoking about  0.50 packs per day. She has never used smokeless tobacco. She reports that she does not drink alcohol or use illicit drugs.  ROS:   Urological Symptom Review  Patient is experiencing the following symptoms: Mild left sided flank pain   Review of Systems  Gastrointestinal (upper)  : Negative for upper GI symptoms  Gastrointestinal (lower) : Negative for lower GI symptoms  Constitutional : Negative for symptoms  Skin: Negative for skin symptoms  Eyes: Negative for eye symptoms  Ear/Nose/Throat : Negative for Ear/Nose/Throat symptoms  Hematologic/Lymphatic: Negative for Hematologic/Lymphatic symptoms  Cardiovascular : Negative for cardiovascular symptoms  Respiratory : Negative for respiratory symptoms  Endocrine: Negative for endocrine symptoms  Musculoskeletal: Negative for musculoskeletal symptoms  Neurological: Negative for neurological symptoms  Psychologic: Negative for psychiatric symptoms   Physical Exam: BP 104/71 mmHg  Pulse 69  LMP 03/16/2015 (Approximate)  Constitutional:  Alert and oriented, No acute distress. HEENT: [place] AT, moist mucus membranes.  Trachea midline, no masses. Cardiovascular: No clubbing, cyanosis, or edema. RRR. Respiratory: Normal respiratory effort, no increased work of breathing. GI: Abdomen is soft, nontender, nondistended, no abdominal masses GU: No CVA tenderness. Mild left CVAT.  Skin: No rashes, bruises or suspicious lesions.  Lymph: No cervical or inguinal adenopathy. Neurologic: Grossly intact, no focal deficits, moving all 4 extremities. Psychiatric: Normal mood and affect.  Laboratory Data: Lab Results  Component Value Date   WBC 9.7 03/23/2015   HGB 17.9* 03/23/2015   HCT 52.7* 03/23/2015   MCV 99.6 03/23/2015   PLT 222 03/23/2015    Lab Results  Component Value Date   CREATININE 0.97 03/23/2015    No results found for: PSA  No results found for: TESTOSTERONE  No results found for:  HGBA1C  Urinalysis    Component Value Date/Time   COLORURINE YELLOW* 03/23/2015 1338   APPEARANCEUR CLEAR* 03/23/2015 1338   LABSPEC 1.002* 03/23/2015 1338   PHURINE 6.0 03/23/2015 1338   GLUCOSEU NEGATIVE 03/23/2015 1338   HGBUR 1+* 03/23/2015 1338   BILIRUBINUR NEGATIVE 03/23/2015 1338   KETONESUR NEGATIVE 03/23/2015 1338   PROTEINUR NEGATIVE 03/23/2015 1338   UROBILINOGEN 0.2 02/27/2015 1745   NITRITE NEGATIVE 03/23/2015 1338   LEUKOCYTESUR 3+* 03/23/2015 1338    Pertinent Imaging: At per HPI  Assessment & Plan:   1 - Recurrent Nephrolithiasis - sig volume left sided stone. Discussed reccomended options of staged ureteroscpoy (two stages, each approx 90 min abotu 2 weeks apart with interval ureteral stenting) v. Percutaneous stone surgery (hopefully one stage, but more invasive and longer recovery and would need to stay at least 1 night in hospital. She opts for staged ureteroscopy and I agree. Will try to schedule first stage ASAP. She has a new job with [employer] and is trying to minimize time off work.  Risks, benefits, alternatives discussed in detail.   Also suggested possible metabolic eval and at least annual GU surveillance after treating curent stone burden and she is amenable.    Return in about 3 months (around 06/24/2015).  Alexis Frock, [facility] [address], [place], [place] [address] [phone]

## 2015-04-08 NOTE — Anesthesia Postprocedure Evaluation (Signed)
  Anesthesia Post-op Note  Patient: Brittany Herrera  Procedure(s) Performed: Procedure(s): CYSTOSCOPY/URETEROSCOPY/HOLMIUM LASER/STENT PLACEMENT (Left)  Anesthesia type:General ETT  Patient location: PACU  Post pain: Pain level controlled  Post assessment: Post-op Vital signs reviewed, Patient's Cardiovascular Status Stable, Respiratory Function Stable, Patent Airway and No signs of Nausea or vomiting  Post vital signs: Reviewed and stable  Last Vitals:  Filed Vitals:   04/08/15 1411  BP: 130/92  Pulse: 53  Temp: 36.6 C  Resp: 16    Level of consciousness: awake, alert  and patient cooperative  Complications: No apparent anesthesia complications

## 2015-04-08 NOTE — Brief Op Note (Signed)
04/08/2015  1:07 PM  PATIENT:  Brittany Herrera  44 y.o. female  PRE-OPERATIVE DIAGNOSIS:  LEFT RENAL STONES  POST-OPERATIVE DIAGNOSIS:  LEFT RENAL STONES  PROCEDURE:  Procedure(s): CYSTOSCOPY/URETEROSCOPY/HOLMIUM LASER/STENT PLACEMENT (Left)  SURGEON:  Surgeon(s) and Role:    * [name], MD - Primary  ASSISTANTS: none   ANESTHESIA:   general  EBL:  Total I/O In: 979 [I.V.:900; IV Piggyback:25] Out: -   Drains: 6 x 24 Fr JJ ureteral stent on left  Specimen: stone fragment  COUNTS CORRECT: YES  PLAN OF CARE: Discharge to home after PACU  PATIENT DISPOSITION:  PACU - hemodynamically stable.

## 2015-04-08 NOTE — Discharge Instructions (Signed)
You have a ureteral stent in place.  This is a tube that extends from your kidney to your bladder.  This may cause urinary bleeding, burning with urination, and urinary frequency.  Please call our office or present to the ED if you develop fevers >101 or pain which is not able to be controlled with oral pain medications.  You may be given either Flomax and/ or ditropan to help with bladder spasms and stent pain in addition to pain medications.   ° °[facility] °[address], [address] °[place], [place] [address] °[phone] ° ° ° °AMBULATORY SURGERY  °DISCHARGE INSTRUCTIONS ° ° °1) The drugs that you were given will stay in your system until tomorrow so for the next 24 hours you should not: ° °A) Drive an automobile °B) Make any legal decisions °C) Drink any alcoholic beverage ° ° °2) You may resume regular meals tomorrow.  Today it is better to start with liquids and gradually work up to solid foods. ° °You may eat anything you prefer, but it is better to start with liquids, then soup and crackers, and gradually work up to solid foods. ° ° °3) Please notify your doctor immediately if you have any unusual bleeding, trouble breathing, redness and pain at the surgery site, drainage, fever, or pain not relieved by medication. ° ° ° °4) Additional Instructions: ° ° ° ° ° ° ° °Please contact your physician with any problems or Same Day Surgery at [phone], Monday through Friday 6 am to 4 pm, or [place] [facility] [place] Main number at [phone]. °

## 2015-04-08 NOTE — Transfer of Care (Signed)
Immediate Anesthesia Transfer of Care Note  Patient: Brittany Herrera  Procedure(s) Performed: Procedure(s): CYSTOSCOPY/URETEROSCOPY/HOLMIUM LASER/STENT PLACEMENT (Left)  Patient Location: PACU  Anesthesia Type:General  Level of Consciousness: awake, oriented and patient cooperative  Airway & Oxygen Therapy: Patient Spontanous Breathing and Patient connected to face mask oxygen  Post-op Assessment: Report given to RN and Post -op Vital signs reviewed and stable  Post vital signs: Reviewed  Last Vitals:  Filed Vitals:   04/08/15 1255  BP: 134/86  Pulse: 68  Temp: 36.2 C  Resp: 37    Complications: No apparent anesthesia complications

## 2015-04-08 NOTE — Anesthesia Procedure Notes (Signed)
Procedure Name: Intubation Date/Time: 04/08/2015 10:44 AM Performed by: Courtney Paris Pre-anesthesia Checklist: Patient identified, Emergency Drugs available, Suction available, Patient being monitored and Timeout performed Patient Re-evaluated:Patient Re-evaluated prior to inductionOxygen Delivery Method: Circle system utilized Preoxygenation: Pre-oxygenation with 100% oxygen Intubation Type: Combination inhalational/ intravenous induction Ventilation: Mask ventilation without difficulty Laryngoscope Size: Miller and 3 Grade View: Grade II Tube type: Oral Tube size: 7.0 mm Number of attempts: 1 Airway Equipment and Method: Stylet Placement Confirmation: ETT inserted through vocal cords under direct vision,  positive ETCO2,  CO2 detector and breath sounds checked- equal and bilateral Secured at: 22 cm Tube secured with: Tape Dental Injury: Teeth and Oropharynx as per pre-operative assessment

## 2015-04-08 NOTE — Interval H&P Note (Signed)
History and Physical Interval Note:  04/08/2015 10:17 AM  Brittany Herrera  has presented today for surgery, with the diagnosis of RENAL STONES  The various methods of treatment have been discussed with the patient and family. After consideration of risks, benefits and other options for treatment, the patient has consented to  Procedure(s): CYSTOSCOPY/URETEROSCOPY/HOLMIUM LASER/STENT PLACEMENT (Left) as a surgical intervention .  The patient's history has been reviewed, patient examined, no change in status, stable for surgery.  I have reviewed the patient's chart and labs.  Questions were answered to the patient's satisfaction.    Preop Ucx+, treated for UTI with appropriate abx.    RRR CTAB   [name]

## 2015-04-09 ENCOUNTER — Encounter: Payer: Self-pay | Admitting: Urology

## 2015-04-10 ENCOUNTER — Emergency Department
Admission: EM | Admit: 2015-04-10 | Discharge: 2015-04-10 | Discharge status: Against Medical Advice | Payer: Medicaid Other | Attending: Student | Admitting: Student

## 2015-04-10 ENCOUNTER — Telehealth: Payer: Self-pay | Admitting: Urology

## 2015-04-10 ENCOUNTER — Other Ambulatory Visit: Payer: Self-pay

## 2015-04-10 ENCOUNTER — Encounter: Payer: Self-pay | Admitting: Urology

## 2015-04-10 ENCOUNTER — Encounter: Payer: Self-pay | Admitting: Emergency Medicine

## 2015-04-10 ENCOUNTER — Other Ambulatory Visit: Payer: Medicaid Other

## 2015-04-10 DIAGNOSIS — N200 Calculus of kidney: Secondary | ICD-10-CM

## 2015-04-10 DIAGNOSIS — T3995XA Adverse effect of unspecified nonopioid analgesic, antipyretic and antirheumatic, initial encounter: Secondary | ICD-10-CM | POA: Insufficient documentation

## 2015-04-10 DIAGNOSIS — Z720 Tobacco use: Secondary | ICD-10-CM | POA: Diagnosis not present

## 2015-04-10 DIAGNOSIS — R4182 Altered mental status, unspecified: Secondary | ICD-10-CM | POA: Insufficient documentation

## 2015-04-10 DIAGNOSIS — R1110 Vomiting, unspecified: Secondary | ICD-10-CM | POA: Diagnosis present

## 2015-04-10 DIAGNOSIS — R531 Weakness: Secondary | ICD-10-CM | POA: Diagnosis not present

## 2015-04-10 LAB — BASIC METABOLIC PANEL
Anion gap: 9 (ref 5–15)
BUN: 8 mg/dL (ref 6–20)
CO2: 29 mmol/L (ref 22–32)
Calcium: 9.3 mg/dL (ref 8.9–10.3)
Chloride: 103 mmol/L (ref 101–111)
Creatinine, Ser: 0.91 mg/dL (ref 0.44–1.00)
GFR calc Af Amer: >60 mL/min (ref >60)
Glucose, Bld: 102 mg/dL — ABNORMAL HIGH (ref 65–99)
Potassium: 3.2 mmol/L — ABNORMAL LOW (ref 3.5–5.1)
Sodium: 141 mmol/L (ref 135–145)

## 2015-04-10 LAB — STONE ANALYSIS
CA PHOS CRY STONE QL IR: 30 %
Ca Oxalate,Dihydrate: 25 %
Ca Oxalate,Monohydr.: 45 %
Stone Weight KSTONE: 127 mg

## 2015-04-10 LAB — CBC
HEMATOCRIT: 45.9 % (ref 35.0–47.0)
Hemoglobin: 15.5 g/dL (ref 12.0–16.0)
MCH: 33.5 pg (ref 26.0–34.0)
MCHC: 33.7 g/dL (ref 32.0–36.0)
MCV: 99.3 fL (ref 80.0–100.0)
Platelets: 203 10*3/uL (ref 150–440)
RBC: 4.62 MIL/uL (ref 3.80–5.20)
RDW: 13.9 % (ref 11.5–14.5)
WBC: 11.4 10*3/uL — ABNORMAL HIGH (ref 3.6–11.0)

## 2015-04-10 LAB — URINALYSIS, COMPLETE
Bilirubin, UA: NEGATIVE
Glucose, UA: NEGATIVE
KETONES UA: NEGATIVE
NITRITE UA: NEGATIVE
SPEC GRAV UA: 1.015 (ref 1.005–1.030)
UUROB: 0.2 mg/dL (ref 0.2–1.0)
pH, UA: 7.5 (ref 5.0–7.5)

## 2015-04-10 LAB — MICROSCOPIC EXAMINATION: BACTERIA UA: NONE SEEN

## 2015-04-10 LAB — URINALYSIS COMPLETE WITH MICROSCOPIC (ARMC ONLY)
BILIRUBIN URINE: NEGATIVE
Glucose, UA: NEGATIVE mg/dL
Nitrite: NEGATIVE
PH: 6 (ref 5.0–8.0)
Protein, ur: 100 mg/dL — AB
SQUAMOUS EPITHELIAL / LPF: NONE SEEN
Specific Gravity, Urine: 1.014 (ref 1.005–1.030)

## 2015-04-10 LAB — POCT PREGNANCY, URINE: Preg Test, Ur: NEGATIVE

## 2015-04-10 NOTE — Telephone Encounter (Signed)
Pt notified that she need to come in and give [place] a urine sample for culture prior to surgery. The pt was notified that the urinalysis was normal.

## 2015-04-10 NOTE — ED Notes (Signed)
Patient states that she had a stent placed Wednesday for a kidney stone. Patient reports today about 10 minutes after taking pain medication she had a 15 minute episode of confusion and uncontrollable vomiting. Patient states that she was given zofran by ems and feels much better after medication. Patient alert and oriented at this time.

## 2015-04-11 LAB — URINE CULTURE: Organism ID, Bacteria: NO GROWTH

## 2015-04-17 ENCOUNTER — Other Ambulatory Visit

## 2015-04-17 ENCOUNTER — Encounter: Payer: Self-pay | Admitting: *Deleted

## 2015-04-17 DIAGNOSIS — Z833 Family history of diabetes mellitus: Secondary | ICD-10-CM | POA: Diagnosis not present

## 2015-04-17 DIAGNOSIS — Z803 Family history of malignant neoplasm of breast: Secondary | ICD-10-CM | POA: Diagnosis not present

## 2015-04-17 DIAGNOSIS — F17210 Nicotine dependence, cigarettes, uncomplicated: Secondary | ICD-10-CM | POA: Diagnosis not present

## 2015-04-17 DIAGNOSIS — N200 Calculus of kidney: Secondary | ICD-10-CM | POA: Diagnosis not present

## 2015-04-17 DIAGNOSIS — Z800 Family history of malignant neoplasm of digestive organs: Secondary | ICD-10-CM | POA: Diagnosis not present

## 2015-04-17 DIAGNOSIS — Z87442 Personal history of urinary calculi: Secondary | ICD-10-CM | POA: Diagnosis not present

## 2015-04-17 NOTE — Patient Instructions (Signed)
  Your procedure is scheduled on: 04/25/15 Wed Report to Day Surgery. To find out your arrival time please call [phone] between 1PM - 3PM on 04/24/15 Tues.  Remember: Instructions that are not followed completely may result in serious medical risk, up to and including death, or upon the discretion of your surgeon and anesthesiologist your surgery may need to be rescheduled.    _x___ 1. Do not eat food or drink liquids after midnight. No gum chewing or hard candies.     __x__ 2. No Alcohol for 24 hours before or after surgery.   ____ 3. Bring all medications with you on the day of surgery if instructed.    _x___ 4. Notify your doctor if there is any change in your medical condition     (cold, fever, infections).     Do not wear jewelry, make-up, hairpins, clips or nail polish.  Do not wear lotions, powders, or perfumes. You may wear deodorant.  Do not shave 48 hours prior to surgery. Men may shave face and neck.  Do not bring valuables to the hospital.    [facility] is not responsible for any belongings or valuables.               Contacts, dentures or bridgework may not be worn into surgery.  Leave your suitcase in the car. After surgery it may be brought to your room.  For patients admitted to the hospital, discharge time is determined by your                treatment team.   Patients discharged the day of surgery will not be allowed to drive home.   Please read over the following fact sheets that you were given:      ____ Take these medicines the morning of surgery with A SIP OF WATER:    1. None  2.   3.   4.  5.  6.  ____ Fleet Enema (as directed)   ____ Use CHG Soap as directed  ____ Use inhalers on the day of surgery  ____ Stop metformin 2 days prior to surgery    ____ Take 1/2 of usual insulin dose the night before surgery and none on the morning of surgery.   ____ Stop Coumadin/Plavix/aspirin on   __x__ Stop Anti-inflammatories on stopped Ibuprofen few  days ago   ____ Stop supplements until after surgery.    ____ Bring C-Pap to the hospital.

## 2015-04-17 NOTE — Telephone Encounter (Signed)
Encounter open in error 

## 2015-04-20 NOTE — Addendum Note (Signed)
Addendum  created 04/20/15 1635 by Andria Frames, MD   Modules edited: Anesthesia Attestations

## 2015-04-22 ENCOUNTER — Ambulatory Visit
Admission: RE | Admit: 2015-04-22 | Discharge: 2015-04-22 | Disposition: A | Discharge status: Home / Self Care | Payer: Medicaid Other | Source: Ambulatory Visit | Attending: Urology | Admitting: Urology

## 2015-04-22 ENCOUNTER — Encounter
Admission: RE | Disposition: A | Discharge status: Home / Self Care | Payer: Self-pay | Source: Ambulatory Visit | Attending: Urology

## 2015-04-22 ENCOUNTER — Ambulatory Visit: Payer: Medicaid Other | Admitting: Anesthesiology

## 2015-04-22 ENCOUNTER — Encounter: Payer: Self-pay | Admitting: *Deleted

## 2015-04-22 DIAGNOSIS — Z87442 Personal history of urinary calculi: Secondary | ICD-10-CM | POA: Insufficient documentation

## 2015-04-22 DIAGNOSIS — N200 Calculus of kidney: Secondary | ICD-10-CM | POA: Insufficient documentation

## 2015-04-22 DIAGNOSIS — Z833 Family history of diabetes mellitus: Secondary | ICD-10-CM | POA: Insufficient documentation

## 2015-04-22 DIAGNOSIS — F17210 Nicotine dependence, cigarettes, uncomplicated: Secondary | ICD-10-CM | POA: Insufficient documentation

## 2015-04-22 DIAGNOSIS — Z803 Family history of malignant neoplasm of breast: Secondary | ICD-10-CM | POA: Insufficient documentation

## 2015-04-22 DIAGNOSIS — Z800 Family history of malignant neoplasm of digestive organs: Secondary | ICD-10-CM | POA: Insufficient documentation

## 2015-04-22 HISTORY — PX: URETEROSCOPY WITH HOLMIUM LASER LITHOTRIPSY: SHX6645

## 2015-04-22 HISTORY — PX: CYSTOSCOPY W/ URETERAL STENT PLACEMENT: SHX1429

## 2015-04-22 LAB — POCT PREGNANCY, URINE: Preg Test, Ur: NEGATIVE

## 2015-04-22 SURGERY — URETEROSCOPY, WITH LITHOTRIPSY USING HOLMIUM LASER
Anesthesia: General | Laterality: Left | Wound class: Clean Contaminated

## 2015-04-22 MED ORDER — GLYCOPYRROLATE 0.2 MG/ML IJ SOLN
INTRAMUSCULAR | Status: DC | PRN
  Administered 2015-04-22: 0.2 mg via INTRAVENOUS

## 2015-04-22 MED ORDER — FAMOTIDINE 20 MG PO TABS
ORAL_TABLET | ORAL | Status: AC
  Administered 2015-04-22: 20 mg via ORAL
  Filled 2015-04-22: qty 1

## 2015-04-22 MED ORDER — SODIUM CHLORIDE 0.9 % IR SOLN
Status: DC | PRN
  Administered 2015-04-22: 1600 mL

## 2015-04-22 MED ORDER — IOTHALAMATE MEGLUMINE 43 % IV SOLN
INTRAVENOUS | Status: DC | PRN
  Administered 2015-04-22: 17 mL

## 2015-04-22 MED ORDER — PROPOFOL 10 MG/ML IV BOLUS
INTRAVENOUS | Status: DC | PRN
  Administered 2015-04-22: 150 mg via INTRAVENOUS

## 2015-04-22 MED ORDER — FAMOTIDINE 20 MG PO TABS
20.0000 mg | ORAL_TABLET | Freq: Once | ORAL | Status: AC
  Administered 2015-04-22: 20 mg via ORAL

## 2015-04-22 MED ORDER — BUPIVACAINE HCL (PF) 0.5 % IJ SOLN
INTRAMUSCULAR | Status: AC
  Filled 2015-04-22: qty 30

## 2015-04-22 MED ORDER — KETOROLAC TROMETHAMINE 30 MG/ML IJ SOLN
INTRAMUSCULAR | Status: DC | PRN
  Administered 2015-04-22: 30 mg via INTRAVENOUS

## 2015-04-22 MED ORDER — MIDAZOLAM HCL 2 MG/2ML IJ SOLN
INTRAMUSCULAR | Status: DC | PRN
  Administered 2015-04-22: 2 mg via INTRAVENOUS

## 2015-04-22 MED ORDER — ONDANSETRON HCL 4 MG/2ML IJ SOLN
INTRAMUSCULAR | Status: DC | PRN
  Administered 2015-04-22: 4 mg via INTRAVENOUS

## 2015-04-22 MED ORDER — ONDANSETRON HCL 4 MG/2ML IJ SOLN: 4.0000 mg | Freq: Once | INTRAMUSCULAR | Status: DC | PRN

## 2015-04-22 MED ORDER — BELLADONNA ALKALOIDS-OPIUM 16.2-60 MG RE SUPP
RECTAL | Status: DC | PRN
  Administered 2015-04-22: 1 via RECTAL

## 2015-04-22 MED ORDER — LACTATED RINGERS IV SOLN
INTRAVENOUS | Status: DC
  Administered 2015-04-22: 07:00:00 via INTRAVENOUS

## 2015-04-22 MED ORDER — CEPHALEXIN 500 MG PO CAPS: 500.0000 mg | ORAL_CAPSULE | Freq: Two times a day (BID) | ORAL | Status: DC

## 2015-04-22 MED ORDER — FENTANYL CITRATE (PF) 100 MCG/2ML IJ SOLN
INTRAMUSCULAR | Status: DC | PRN
  Administered 2015-04-22: 50 ug via INTRAVENOUS
  Administered 2015-04-22: 100 ug via INTRAVENOUS

## 2015-04-22 MED ORDER — BUPIVACAINE HCL (PF) 0.5 % IJ SOLN
INTRAMUSCULAR | Status: DC | PRN
  Administered 2015-04-22: 30 mL

## 2015-04-22 MED ORDER — FENTANYL CITRATE (PF) 100 MCG/2ML IJ SOLN: 25.0000 ug | INTRAMUSCULAR | Status: DC | PRN

## 2015-04-22 MED ORDER — LIDOCAINE HCL (CARDIAC) 20 MG/ML IV SOLN
INTRAVENOUS | Status: DC | PRN
  Administered 2015-04-22: 100 mg via INTRAVENOUS

## 2015-04-22 MED ORDER — GENTAMICIN SULFATE 40 MG/ML IJ SOLN
80.0000 mg | Freq: Once | INTRAVENOUS | Status: AC
  Administered 2015-04-22: 80 mg via INTRAVENOUS
  Filled 2015-04-22: qty 2

## 2015-04-22 MED ORDER — SODIUM CHLORIDE 0.9 % IV SOLN
1.0000 g | Freq: Once | INTRAVENOUS | Status: AC
  Administered 2015-04-22: 1 g via INTRAVENOUS
  Filled 2015-04-22: qty 1000

## 2015-04-22 MED ORDER — BELLADONNA ALKALOIDS-OPIUM 16.2-60 MG RE SUPP
RECTAL | Status: AC
Start: 2015-04-22 — End: 2015-04-22
  Filled 2015-04-22: qty 1

## 2015-04-22 MED ORDER — CEPHALEXIN 500 MG PO CAPS: 500.0000 mg | ORAL_CAPSULE | Freq: Four times a day (QID) | ORAL | Status: DC

## 2015-04-22 MED ORDER — OXYCODONE-ACETAMINOPHEN 5-325 MG PO TABS: 1.0000 | ORAL_TABLET | ORAL | Status: DC | PRN

## 2015-04-22 SURGICAL SUPPLY — 29 items
BAG DRAIN CYSTO-URO LG1000N (MISCELLANEOUS) ×2 IMPLANT
CATH URETL 5X70 OPEN END (CATHETERS) ×2 IMPLANT
CNTNR SPEC 2.5X3XGRAD LEK (MISCELLANEOUS)
CONRAY 43 FOR UROLOGY 50M (MISCELLANEOUS) ×2 IMPLANT
CONT SPEC 4OZ STER OR WHT (MISCELLANEOUS)
CONT SPEC 4OZ STRL OR WHT (MISCELLANEOUS)
CONTAINER SPEC 2.5X3XGRAD LEK (MISCELLANEOUS) ×1 IMPLANT
FEE TECHNICIAN ONLY PER HOUR (MISCELLANEOUS) ×1 IMPLANT
GLOVE BIO SURGEON STRL SZ7 (GLOVE) ×4 IMPLANT
GLOVE BIO SURGEON STRL SZ7.5 (GLOVE) ×2 IMPLANT
GOWN STRL REUS W/ TWL LRG LVL3 (GOWN DISPOSABLE) ×1 IMPLANT
GOWN STRL REUS W/ TWL XL LVL3 (GOWN DISPOSABLE) ×1 IMPLANT
GOWN STRL REUS W/TWL LRG LVL3 (GOWN DISPOSABLE) ×2
GOWN STRL REUS W/TWL XL LVL3 (GOWN DISPOSABLE) ×2
GUIDEWIRE STR ZIPWIRE 035X150 (MISCELLANEOUS) ×2 IMPLANT
INTRODUCER DILATOR DOUBLE (INTRODUCER) ×2 IMPLANT
JELLY LUB 2OZ STRL (MISCELLANEOUS) ×1
JELLY LUBE 2OZ STRL (MISCELLANEOUS) ×1 IMPLANT
LASER HOLMIUM FIBER SU 272UM (MISCELLANEOUS) IMPLANT
PACK CYSTO AR (MISCELLANEOUS) ×2 IMPLANT
PREP PVP WINGED SPONGE (MISCELLANEOUS) ×2 IMPLANT
SENSORWIRE 0.038 NOT ANGLED (WIRE) ×2
SET CYSTO W/LG BORE CLAMP LF (SET/KITS/TRAYS/PACK) ×2 IMPLANT
SHEATH URETERAL 13/15X36 1L (SHEATH) ×2 IMPLANT
SOL .9 NS 3000ML IRR  AL (IV SOLUTION) ×1
SOL .9 NS 3000ML IRR AL (IV SOLUTION) ×1
SOL .9 NS 3000ML IRR UROMATIC (IV SOLUTION) ×1 IMPLANT
WATER STERILE IRR 1000ML POUR (IV SOLUTION) ×2 IMPLANT
WIRE SENSOR 0.038 NOT ANGLED (WIRE) ×1 IMPLANT

## 2015-04-22 NOTE — H&P (Signed)
PLan Left ureterosopy and laser litho of calculi in left kidney that are residual post 04/08/15 ureteroscopy and lithotripsy   No change in H and P  HS:RRR and Lungs CTA

## 2015-04-22 NOTE — Transfer of Care (Signed)
Immediate Anesthesia Transfer of Care Note  Patient: Brittany Herrera  Procedure(s) Performed: Procedure(s): URETEROSCOPY WITH HOLMIUM LASER LITHOTRIPSY (Left) CYSTOSCOPY WITH STENT REPLACEMENT (Left)  Patient Location: PACU  Anesthesia Type:General  Level of Consciousness: sedated and patient cooperative  Airway & Oxygen Therapy: Patient Spontanous Breathing and Patient connected to nasal cannula oxygen  Post-op Assessment: Report given to RN and Post -op Vital signs reviewed and stable  Post vital signs: Reviewed and stable  Last Vitals:  Filed Vitals:   04/22/15 0840  BP: 107/77  Pulse:   Temp: 36.1 C  Resp: 16    Complications: No apparent anesthesia complications

## 2015-04-22 NOTE — Discharge Instructions (Addendum)
AMBULATORY SURGERY  °DISCHARGE INSTRUCTIONS ° ° °1) The drugs that you were given will stay in your system until tomorrow so for the next 24 hours you should not: ° °A) Drive an automobile °B) Make any legal decisions °C) Drink any alcoholic beverage ° ° °2) You may resume regular meals tomorrow.  Today it is better to start with liquids and gradually work up to solid foods. ° °You may eat anything you prefer, but it is better to start with liquids, then soup and crackers, and gradually work up to solid foods. ° ° °3) Please notify your doctor immediately if you have any unusual bleeding, trouble breathing, redness and pain at the surgery site, drainage, fever, or pain not relieved by medication. ° ° ° °4) Additional Instructions: ° ° ° ° ° ° ° °Please contact your physician with any problems or Same Day Surgery at [phone], Monday through Friday 6 am to 4 pm, or [place] [facility] [place] Main number at [phone].AMBULATORY SURGERY  °DISCHARGE INSTRUCTIONS ° ° °5) The drugs that you were given will stay in your system until tomorrow so for the next 24 hours you should not: ° °D) Drive an automobile °E) Make any legal decisions °F) Drink any alcoholic beverage ° ° °6) You may resume regular meals tomorrow.  Today it is better to start with liquids and gradually work up to solid foods. ° °You may eat anything you prefer, but it is better to start with liquids, then soup and crackers, and gradually work up to solid foods. ° ° °7) Please notify your doctor immediately if you have any unusual bleeding, trouble breathing, redness and pain at the surgery site, drainage, fever, or pain not relieved by medication. ° ° ° °8) Additional Instructions: ° ° ° ° ° ° ° °Please contact your physician with any problems or Same Day Surgery at [phone], Monday through Friday 6 am to 4 pm, or [place] [facility] [place] Main number at [phone]. °

## 2015-04-22 NOTE — Anesthesia Preprocedure Evaluation (Signed)
Anesthesia Evaluation  Patient identified by MRN, date of birth, ID band Patient awake    Reviewed: Allergy & Precautions, NPO status , Patient's Chart, lab work & pertinent test results  History of Anesthesia Complications (+) Family history of anesthesia reaction  Airway Mallampati: II  TM Distance: >3 FB Neck ROM: Full    Dental  (+) Poor Dentition, Chipped   Pulmonary Current Smoker,  breath sounds clear to auscultation  Pulmonary exam normal       Cardiovascular Normal cardiovascular exam    Neuro/Psych    GI/Hepatic negative GI ROS, Neg liver ROS,   Endo/Other  negative endocrine ROS  Renal/GU Kidney stones  Female GU complaint Dysfunctional uterine bleeding    Musculoskeletal negative musculoskeletal ROS (+)   Abdominal Normal abdominal exam  (+)   Peds  Hematology  (+) anemia ,   Anesthesia Other Findings   Reproductive/Obstetrics                             Anesthesia Physical Anesthesia Plan  ASA: II  Anesthesia Plan: General   Post-op Pain Management:    Induction: Intravenous  Airway Management Planned: LMA  Additional Equipment:   Intra-op Plan:   Post-operative Plan: Extubation in OR  Informed Consent: I have reviewed the patients History and Physical, chart, labs and discussed the procedure including the risks, benefits and alternatives for the proposed anesthesia with the patient or authorized representative who has indicated his/her understanding and acceptance.   Dental advisory given  Plan Discussed with: CRNA and Surgeon  Anesthesia Plan Comments:         Anesthesia Quick Evaluation

## 2015-04-22 NOTE — Anesthesia Postprocedure Evaluation (Signed)
  Anesthesia Post-op Note  Patient: Brittany Herrera  Procedure(s) Performed: Procedure(s): URETEROSCOPY WITH HOLMIUM LASER LITHOTRIPSY (Left) CYSTOSCOPY WITH STENT REPLACEMENT (Left)  Anesthesia type:General  Patient location: PACU  Post pain: Pain level controlled  Post assessment: Post-op Vital signs reviewed, Patient's Cardiovascular Status Stable, Respiratory Function Stable, Patent Airway and No signs of Nausea or vomiting  Post vital signs: Reviewed and stable  Last Vitals:  Filed Vitals:   04/22/15 1007  BP: 115/74  Pulse: 61  Temp:   Resp:     Level of consciousness: awake, alert  and patient cooperative  Complications: No apparent anesthesia complications

## 2015-04-22 NOTE — Op Note (Signed)
Preop ureteral calculous Postop renal calculous Procedure  Cysto, left retrograde pyelogram, ureteroscopy stent Anes: general  With the patient sterile draped,in the supine lithotomy position for ease of approach to the external genitalia we begin the procedure.  A time-out is taken and then with a 21FR Cystoscope shealth we ender the bladder.  30 degree lens is utilized.  Right retrograde is done utilizing a 10 French double-lumen ureteral access catheter open ended catheter and omnipaq contrast  No filling defect is seen in the ureter but a calculous is seen in the left lower calyx .  A double lumen ureteral access catheter is put up over a 0.35 sensor wire.  A second wire is put up thru the catheter and then the dilator is removed. The safety wires attached carefully to the drapes and can be pulled out  A Navigator is then placed up over the wire and the wire and obturator is removed. A digital flexible scope is placed up the ureter thru the navigator   A digital scope goes to the kidney.  A residual lower pole calculous is located and disintigrated to a very small size It is too small to put into a basket. There are multiple tiny fragments as small as the end of the 200  laser. There are no more fragments in the lower pole kidney. So the residual portion of the large staghorn none on 04/08/2015 is now disintegrated into multiple tiny pieces.  The scope and sheath are removed. A 24 cm 6 [place] double ended coiled stent is placed into the upper pole kidney and the bladder. The string is left just long enough to remove the stent on Sunday 5 days postop It is checked for position and the bladder is emptied thru the cystoscope sheath.  16ml of 0.5% marcaine is put in the bladder and sheath is withdrawn.    A60mg  Belladonna and opium suppository is placed in the rectum.  There a normal rectal exam is completed.  The bladder itself showed no turmors, masses or calculi  The patient was sent to the recovery room in  satisfactory condition.

## 2015-04-22 NOTE — Anesthesia Procedure Notes (Signed)
Procedure Name: LMA Insertion Date/Time: 04/22/2015 7:46 AM Performed by: Rosaria Ferries, Mauriana Dann Pre-anesthesia Checklist: Patient identified, Emergency Drugs available, Suction available and Patient being monitored Patient Re-evaluated:Patient Re-evaluated prior to inductionOxygen Delivery Method: Circle system utilized Preoxygenation: Pre-oxygenation with 100% oxygen Intubation Type: IV induction LMA: LMA inserted LMA Size: 4.0 Number of attempts: 1 Placement Confirmation: breath sounds checked- equal and bilateral

## 2015-04-23 ENCOUNTER — Ambulatory Visit (INDEPENDENT_AMBULATORY_CARE_PROVIDER_SITE_OTHER): Payer: Medicaid Other | Admitting: Urology

## 2015-04-23 ENCOUNTER — Encounter: Payer: Self-pay | Admitting: Urology

## 2015-04-23 VITALS — BP 104/51 | HR 69 | Ht 60.0 in | Wt 150.0 lb

## 2015-04-23 DIAGNOSIS — N200 Calculus of kidney: Secondary | ICD-10-CM

## 2015-04-23 LAB — URINE CULTURE

## 2015-04-23 MED ORDER — CIPROFLOXACIN HCL 500 MG PO TABS
500.0000 mg | ORAL_TABLET | Freq: Once | ORAL | Status: AC
  Administered 2015-04-23: 500 mg via ORAL

## 2015-04-23 MED ORDER — LIDOCAINE HCL 2 % EX GEL
1.0000 "application " | Freq: Once | CUTANEOUS | Status: AC
  Administered 2015-04-23: 1 via URETHRAL

## 2015-04-23 NOTE — Progress Notes (Signed)
    Cystoscopy stent removal Procedure Note  Patient identification was confirmed, informed consent was obtained, and patient was prepped using Betadine solution.  Lidocaine jelly was administered per urethral meatus.    Preoperative abx where received prior to procedure.    Procedure: - Flexible cystoscope introduced, without any difficulty.   - Thorough search of the bladder revealed:    normal urethral meatus    normal urothelium    no stones    no ulcers     no tumors    no urethral polyps    no trabeculation Stent was seen in the left ureteral orifice it was grasped with the graspers through the flexible scope and removed without difficulty - Ureteral orifices were normal in position and appearance.  Post-Procedure: - Patient tolerated the procedure well

## 2015-05-05 ENCOUNTER — Encounter: Payer: Self-pay | Admitting: Urology

## 2015-05-06 ENCOUNTER — Ambulatory Visit

## 2015-05-26 ENCOUNTER — Encounter: Payer: Self-pay | Admitting: Emergency Medicine

## 2015-05-26 ENCOUNTER — Emergency Department: Payer: Medicaid Other

## 2015-05-26 ENCOUNTER — Emergency Department
Admission: EM | Admit: 2015-05-26 | Discharge: 2015-05-26 | Disposition: A | Discharge status: Home / Self Care | Payer: Medicaid Other | Attending: Emergency Medicine | Admitting: Emergency Medicine

## 2015-05-26 DIAGNOSIS — J209 Acute bronchitis, unspecified: Secondary | ICD-10-CM | POA: Insufficient documentation

## 2015-05-26 DIAGNOSIS — Z720 Tobacco use: Secondary | ICD-10-CM | POA: Insufficient documentation

## 2015-05-26 DIAGNOSIS — Z79899 Other long term (current) drug therapy: Secondary | ICD-10-CM | POA: Insufficient documentation

## 2015-05-26 DIAGNOSIS — Z792 Long term (current) use of antibiotics: Secondary | ICD-10-CM | POA: Diagnosis not present

## 2015-05-26 DIAGNOSIS — R05 Cough: Secondary | ICD-10-CM | POA: Diagnosis present

## 2015-05-26 DIAGNOSIS — J40 Bronchitis, not specified as acute or chronic: Secondary | ICD-10-CM

## 2015-05-26 MED ORDER — PREDNISONE 10 MG PO TABS: ORAL_TABLET | ORAL | Status: DC

## 2015-05-26 MED ORDER — ALBUTEROL SULFATE HFA 108 (90 BASE) MCG/ACT IN AERS: 2.0000 | INHALATION_SPRAY | Freq: Four times a day (QID) | RESPIRATORY_TRACT | Status: DC | PRN

## 2015-05-26 MED ORDER — AZITHROMYCIN 250 MG PO TABS: ORAL_TABLET | ORAL | Status: AC

## 2015-05-26 MED ORDER — IPRATROPIUM-ALBUTEROL 0.5-2.5 (3) MG/3ML IN SOLN
3.0000 mL | Freq: Once | RESPIRATORY_TRACT | Status: AC
  Administered 2015-05-26: 3 mL via RESPIRATORY_TRACT
  Filled 2015-05-26: qty 3

## 2015-05-26 NOTE — ED Provider Notes (Signed)
CSN: [id number]     Arrival date & time 05/26/15  1042 History   First MD Initiated Contact with Patient 05/26/15 1109     Chief Complaint  Patient presents with  . Cough    HPI Comments: 44 year old female presents today complaining of cough and congestion for the past 4 weeks. Pt reports everyone in her family has been sick. Cough is productive of thick, green mucous. Has not had any trouble breathing. Has a history of COPD. Has been taking mucinex with moderate relief. Positive 1ppd smoker.   Patient is a 44 y.o. female presenting with cough. The history is provided by the patient.  Cough Cough characteristics:  Productive Sputum characteristics:  Green Severity:  Moderate Duration:  4 weeks Timing:  Intermittent Progression:  Worsening Chronicity:  Recurrent Context: sick contacts   Relieved by:  None tried Ineffective treatments: mucinex. Associated symptoms: fever, rhinorrhea and sinus congestion   Associated symptoms: no chills, no rash and no shortness of breath     Past Medical History  Diagnosis Date  . Abnormal Pap smear 2001    [place] [facility]- told her that she had "pods" didnt get tx  . Kidney stones 2011, 2012  . DUB (dysfunctional uterine bleeding)   . Kidney stones   . Anemia     ONLY WITH PREGNANCY  . Family history of adverse reaction to anesthesia     Blyn UP   Past Surgical History  Procedure Laterality Date  . Lithotripsy    . Cystoscopy/ureteroscopy/holmium laser/stent placement Left 04/08/2015    Procedure: CYSTOSCOPY/URETEROSCOPY/HOLMIUM LASER/STENT PLACEMENT;  Surgeon: [name], MD;  Location: [facility];  Service: Urology;  Laterality: Left;  . Ureteroscopy with holmium laser lithotripsy Left 04/22/2015    Procedure: URETEROSCOPY WITH HOLMIUM LASER LITHOTRIPSY;  Surgeon: Collier Flowers, MD;  Location: [facility];  Service: Urology;  Laterality: Left;  . Cystoscopy w/ ureteral stent placement Left 04/22/2015    Procedure:  CYSTOSCOPY WITH STENT REPLACEMENT;  Surgeon: Collier Flowers, MD;  Location: [facility];  Service: Urology;  Laterality: Left;   Family History  Problem Relation Age of Onset  . Cancer Mother     breast  . Cancer Maternal Grandmother     breast, colon  . Diabetes Paternal Grandmother    Social History  Substance Use Topics  . Smoking status: Current Every Day Smoker -- 0.50 packs/day for 15 years    Types: Cigarettes  . Smokeless tobacco: Never Used  . Alcohol Use: Yes     Comment: RARE   OB History    Gravida Para Term Preterm AB TAB SAB Ectopic Multiple Living   1 1  1  0 0 0 0 0 1     Review of Systems  Constitutional: Positive for fever. Negative for chills.  HENT: Positive for rhinorrhea.   Respiratory: Positive for cough. Negative for shortness of breath.   Skin: Negative for rash.  All other systems reviewed and are negative.     Allergies  Review of patient's allergies indicates no known allergies.  Home Medications   Prior to Admission medications   Medication Sig Start Date End Date Taking? Authorizing Provider  acetaminophen (TYLENOL) 325 MG suppository Place 325 mg rectally every 4 (four) hours as needed.    Historical Provider, MD  albuterol (PROVENTIL HFA;VENTOLIN HFA) 108 (90 BASE) MCG/ACT inhaler Inhale 2 puffs into the lungs every 6 (six) hours as needed for wheezing or shortness of breath. 05/26/15  Shayne Alken V, PA-C  azithromycin (ZITHROMAX Z-PAK) 250 MG tablet Take 2 tablets (500 mg) on  Day 1,  followed by 1 tablet (250 mg) once daily on Days 2 through 5. 05/26/15 05/31/15  Corliss Parish, PA-C  cephALEXin (KEFLEX) 500 MG capsule Take 1 capsule (500 mg total) by mouth 2 (two) times daily. 04/22/15   Collier Flowers, MD  cephALEXin (KEFLEX) 500 MG capsule Take 1 capsule (500 mg total) by mouth 4 (four) times daily. 04/22/15   Collier Flowers, MD  HYDROcodone-acetaminophen (NORCO/VICODIN) 5-325 MG per tablet Take 1-2 tablets by mouth every 4 (four) hours as  needed for moderate pain or severe pain. 04/08/15   [name], MD  ibuprofen (ADVIL,MOTRIN) 200 MG tablet Take 200 mg by mouth every 6 (six) hours as needed for headache or mild pain.     Historical Provider, MD  oxybutynin (DITROPAN) 5 MG tablet Take 1 tablet (5 mg total) by mouth every 8 (eight) hours as needed for bladder spasms. 04/08/15   [name], MD  oxyCODONE-acetaminophen (ROXICET) 5-325 MG per tablet Take 1 tablet by mouth every 4 (four) hours as needed for moderate pain or severe pain. 04/22/15   Collier Flowers, MD  predniSONE (DELTASONE) 10 MG tablet Taper: 6,5,4,3,2,1 05/26/15   Corliss Parish, PA-C  tamsulosin (FLOMAX) 0.4 MG CAPS capsule Take 1 capsule (0.4 mg total) by mouth daily. 04/08/15   [name], MD   BP 121/80 mmHg  Pulse 90  Temp(Src) 98.3 F (36.8 C) (Oral)  Resp 20  Ht 5\' 4"  (1.626 m)  Wt 130 lb (58.968 kg)  BMI 22.30 kg/m2  SpO2 96%  LMP 05/26/2015 (Exact Date) Physical Exam  Constitutional: She is oriented to person, place, and time. Vital signs are normal. She appears well-developed and well-nourished. She is active.  Non-toxic appearance. She does not have a sickly appearance. She does not appear ill.  HENT:  Head: Normocephalic and atraumatic.  Right Ear: Tympanic membrane and external ear normal.  Left Ear: Tympanic membrane and external ear normal.  Nose: Nose normal.  Mouth/Throat: Oropharynx is clear and moist.  Eyes: Conjunctivae and EOM are normal. Pupils are equal, round, and reactive to light.  Neck: Normal range of motion. Neck supple.  Cardiovascular: Normal rate, regular rhythm, normal heart sounds and intact distal pulses.   Pulmonary/Chest: Effort normal. No respiratory distress. She has wheezes. She has rales.  Musculoskeletal: Normal range of motion.  Lymphadenopathy:    She has no cervical adenopathy.  Neurological: She is alert and oriented to person, place, and time.  Skin: Skin is warm and dry.  Psychiatric: She has a  normal mood and affect. Her behavior is normal. Judgment and thought content normal.  Nursing note and vitals reviewed.   ED Course  Procedures (including critical care time) Labs Review Labs Reviewed - No data to display  Imaging Review Dg Chest 2 View  05/26/2015   CLINICAL DATA:  Cough for 4 weeks.  Smoker.  EXAM: CHEST  2 VIEW  COMPARISON:  02/17/2014 chest radiograph.  FINDINGS: Stable cardiomediastinal silhouette with normal heart size. No pneumothorax. No pleural effusion. Clear lungs, with no pulmonary edema. Mild degenerative changes in the thoracic spine.  IMPRESSION: No active disease in the chest.   Electronically Signed   By: Ilona Sorrel M.D.   On: 05/26/2015 12:49   I have personally reviewed and evaluated these images and lab results as part of my medical decision-making.   EKG Interpretation  None      MDM  Encouraged smoking cessation Zpak course Prednisone taper, albuterol q4-6hours prn wheezing Follow up with PCP if no improvement  Final diagnoses:  Bronchitis        Corliss Parish, PA-C 05/26/15 1256  Delman Kitten, MD 05/26/15 [phone]

## 2015-05-26 NOTE — ED Notes (Signed)
Pt to ed with c/o cough and congestion x 3 weeks.

## 2016-10-22 ENCOUNTER — Emergency Department: Payer: Medicaid Other

## 2016-10-22 ENCOUNTER — Emergency Department
Admission: EM | Admit: 2016-10-22 | Discharge: 2016-10-22 | Disposition: A | Discharge status: Home / Self Care | Payer: Medicaid Other | Attending: Emergency Medicine | Admitting: Emergency Medicine

## 2016-10-22 ENCOUNTER — Encounter: Payer: Self-pay | Admitting: Emergency Medicine

## 2016-10-22 DIAGNOSIS — Y999 Unspecified external cause status: Secondary | ICD-10-CM | POA: Insufficient documentation

## 2016-10-22 DIAGNOSIS — S39011A Strain of muscle, fascia and tendon of abdomen, initial encounter: Secondary | ICD-10-CM | POA: Insufficient documentation

## 2016-10-22 DIAGNOSIS — Y929 Unspecified place or not applicable: Secondary | ICD-10-CM | POA: Insufficient documentation

## 2016-10-22 DIAGNOSIS — Y939 Activity, unspecified: Secondary | ICD-10-CM | POA: Insufficient documentation

## 2016-10-22 DIAGNOSIS — F17210 Nicotine dependence, cigarettes, uncomplicated: Secondary | ICD-10-CM | POA: Insufficient documentation

## 2016-10-22 DIAGNOSIS — T148XXA Other injury of unspecified body region, initial encounter: Secondary | ICD-10-CM

## 2016-10-22 DIAGNOSIS — N3000 Acute cystitis without hematuria: Secondary | ICD-10-CM | POA: Insufficient documentation

## 2016-10-22 DIAGNOSIS — X58XXXA Exposure to other specified factors, initial encounter: Secondary | ICD-10-CM | POA: Insufficient documentation

## 2016-10-22 DIAGNOSIS — Z79899 Other long term (current) drug therapy: Secondary | ICD-10-CM | POA: Insufficient documentation

## 2016-10-22 LAB — BASIC METABOLIC PANEL
Anion gap: 6 (ref 5–15)
BUN: 10 mg/dL (ref 6–20)
CHLORIDE: 102 mmol/L (ref 101–111)
CO2: 27 mmol/L (ref 22–32)
CREATININE: 0.91 mg/dL (ref 0.44–1.00)
Calcium: 9.1 mg/dL (ref 8.9–10.3)
GFR calc Af Amer: >60 mL/min (ref >60)
GFR calc non Af Amer: >60 mL/min (ref >60)
GLUCOSE: 98 mg/dL (ref 65–99)
POTASSIUM: 3.4 mmol/L — AB (ref 3.5–5.1)
SODIUM: 135 mmol/L (ref 135–145)

## 2016-10-22 LAB — URINALYSIS, COMPLETE (UACMP) WITH MICROSCOPIC
Bilirubin Urine: NEGATIVE
Glucose, UA: NEGATIVE mg/dL
HGB URINE DIPSTICK: NEGATIVE
Ketones, ur: NEGATIVE mg/dL
NITRITE: POSITIVE — AB
PROTEIN: NEGATIVE mg/dL
SPECIFIC GRAVITY, URINE: 1.01 (ref 1.005–1.030)
pH: 6 (ref 5.0–8.0)

## 2016-10-22 LAB — CBC
HEMATOCRIT: 43.7 % (ref 35.0–47.0)
Hemoglobin: 15.1 g/dL (ref 12.0–16.0)
MCH: 35.8 pg — AB (ref 26.0–34.0)
MCHC: 34.5 g/dL (ref 32.0–36.0)
MCV: 103.7 fL — AB (ref 80.0–100.0)
PLATELETS: 199 10*3/uL (ref 150–440)
RBC: 4.21 MIL/uL (ref 3.80–5.20)
RDW: 14.2 % (ref 11.5–14.5)
WBC: 9.6 10*3/uL (ref 3.6–11.0)

## 2016-10-22 LAB — TROPONIN I: Troponin I: <0.03 ng/mL (ref <0.03)

## 2016-10-22 LAB — PREGNANCY, URINE: PREG TEST UR: NEGATIVE

## 2016-10-22 MED ORDER — IBUPROFEN 800 MG PO TABS: 800.0000 mg | ORAL_TABLET | Freq: Three times a day (TID) | ORAL | 0 refills | Status: DC | PRN

## 2016-10-22 MED ORDER — DIAZEPAM 5 MG PO TABS: 5.0000 mg | ORAL_TABLET | Freq: Three times a day (TID) | ORAL | 0 refills | Status: DC | PRN

## 2016-10-22 MED ORDER — IBUPROFEN 800 MG PO TABS
800.0000 mg | ORAL_TABLET | Freq: Once | ORAL | Status: AC
  Administered 2016-10-22: 800 mg via ORAL
  Filled 2016-10-22: qty 1

## 2016-10-22 MED ORDER — DIAZEPAM 5 MG PO TABS
10.0000 mg | ORAL_TABLET | Freq: Once | ORAL | Status: AC
  Administered 2016-10-22: 10 mg via ORAL
  Filled 2016-10-22: qty 2

## 2016-10-22 MED ORDER — SULFAMETHOXAZOLE-TRIMETHOPRIM 800-160 MG PO TABS: 1.0000 | ORAL_TABLET | Freq: Two times a day (BID) | ORAL | 0 refills | Status: DC

## 2016-10-22 NOTE — ED Notes (Signed)
Patient denies chest pain at this time 

## 2016-10-22 NOTE — ED Triage Notes (Signed)
Also thinks getting urine infection.

## 2016-10-22 NOTE — ED Triage Notes (Addendum)
Pt c/o left chest pain and left back pain since this morning. Also c/o mid back pain. Pain worse with any movement including left arm movement.  Feels like heavy breathing makes worse as well.  Appears to have pain with movement but no distress currently. Skin warm and dry. Chest/back pain is achy but sharp at times. When gets sharp pain reports feels like all her muscles tighten.

## 2016-10-22 NOTE — ED Notes (Signed)
Patient transported to CT 

## 2016-10-22 NOTE — ED Provider Notes (Signed)
[facility] Emergency Department Provider Note        Time seen: ----------------------------------------- 8:32 PM on 10/22/2016 -----------------------------------------    I have reviewed the triage vital signs and the nursing notes.   HISTORY  Chief Complaint Chest Pain and Back Pain    HPI Brittany Herrera is a 46 y.o. female who presents the ER for left-sided chest pain and left back pain since this morning. Patient is complaining of mid back pain. Pain is worse with any movement including left arm movement. Patient feels like heavy breathing makes the pain worse as well. She describesa sharp pain that is 6 out of 10 and worse with movement. She denies fevers, chills or other complaints. She denies any recent illness.   Past Medical History:  Diagnosis Date  . Abnormal Pap smear 2001   [place] [facility]- told her that she had "pods" didnt get tx  . Anemia    ONLY WITH PREGNANCY  . DUB (dysfunctional uterine bleeding)   . Family history of adverse reaction to anesthesia    MOM-HAS HARD TIME WAKING UP  . Kidney stones 2011, 2012  . Kidney stones     Patient Active Problem List   Diagnosis Date Noted  . Recurrent nephrolithiasis 03/24/2015  . Anxiety 03/21/2013  . DUB (dysfunctional uterine bleeding) 01/07/2013    Past Surgical History:  Procedure Laterality Date  . CYSTOSCOPY W/ URETERAL STENT PLACEMENT Left 04/22/2015   Procedure: CYSTOSCOPY WITH STENT REPLACEMENT;  Surgeon: Collier Flowers, MD;  Location: [facility];  Service: Urology;  Laterality: Left;  . CYSTOSCOPY/URETEROSCOPY/HOLMIUM LASER/STENT PLACEMENT Left 04/08/2015   Procedure: CYSTOSCOPY/URETEROSCOPY/HOLMIUM LASER/STENT PLACEMENT;  Surgeon: [name], MD;  Location: [facility];  Service: Urology;  Laterality: Left;  . LITHOTRIPSY    . URETEROSCOPY WITH HOLMIUM LASER LITHOTRIPSY Left 04/22/2015   Procedure: URETEROSCOPY WITH HOLMIUM LASER LITHOTRIPSY;  Surgeon: Collier Flowers, MD;  Location: [facility];  Service: Urology;  Laterality: Left;    Allergies Patient has no known allergies.  Social History Social History  Substance Use Topics  . Smoking status: Current Every Day Smoker    Packs/day: 0.50    Years: 15.00    Types: Cigarettes  . Smokeless tobacco: Never Used  . Alcohol use Yes     Comment: RARE    Review of Systems Constitutional: Negative for fever. Cardiovascular: Positive for chest pain Respiratory: Negative for shortness of breath. Gastrointestinal: Negative for abdominal pain, vomiting and diarrhea. Genitourinary: Negative for dysuria. Musculoskeletal: Positive for flank pain Skin: Negative for rash. Neurological: Negative for headaches, focal weakness or numbness.  10-point ROS otherwise negative.  ____________________________________________   PHYSICAL EXAM:  VITAL SIGNS: ED Triage Vitals  Enc Vitals Group     BP 10/22/16 1843 136/81     Pulse Rate 10/22/16 1843 72     Resp 10/22/16 1843 18     Temp 10/22/16 1843 98.4 F (36.9 C)     Temp Source 10/22/16 1843 Oral     SpO2 10/22/16 1843 98 %     Weight 10/22/16 1841 135 lb (61.2 kg)     Height 10/22/16 1841 5\' 2"  (1.575 m)     Head Circumference --      Peak Flow --      Pain Score 10/22/16 1841 6     Pain Loc --      Pain Edu? --      Excl. in [place]? --     Constitutional: Alert and oriented.  Well appearing and in no distress. Eyes: Conjunctivae are normal. PERRL. Normal extraocular movements. ENT   Head: Normocephalic and atraumatic.   Nose: No congestion/rhinnorhea.   Mouth/Throat: Mucous membranes are moist.   Neck: No stridor. Cardiovascular: Normal rate, regular rhythm. No murmurs, rubs, or gallops. Respiratory: Normal respiratory effort without tachypnea nor retractions. Breath sounds are clear and equal bilaterally. No wheezes/rales/rhonchi. Gastrointestinal: Soft and nontender. Normal bowel sounds Musculoskeletal: Significant pain  elicited with range of motion of the left upper extremity and palpation of the left flank and left-sided thoracic paraspinous muscles Neurologic:  Normal speech and language. No gross focal neurologic deficits are appreciated.  Skin:  Skin is warm, dry and intact. No rash noted. Psychiatric: Mood and affect are normal. Speech and behavior are normal.  ____________________________________________  EKG: Interpreted by me. Sinus rhythm rate of 66 bpm, normal PR interval, normal QRS, normal QT.  ____________________________________________  ED COURSE:  Pertinent labs & imaging results that were available during my care of the patient were reviewed by me and considered in my medical decision making (see chart for details). Patient presents to the ER no distress with chest and side pain. We will assess with labs and possibly imaging.   Procedures ____________________________________________   LABS (pertinent positives/negatives)  Labs Reviewed  BASIC METABOLIC PANEL - Abnormal; Notable for the following:       Result Value   Potassium 3.4 (*)    All other components within normal limits  CBC - Abnormal; Notable for the following:    MCV 103.7 (*)    MCH 35.8 (*)    All other components within normal limits  URINALYSIS, COMPLETE (UACMP) WITH MICROSCOPIC - Abnormal; Notable for the following:    Color, Urine YELLOW (*)    APPearance CLEAR (*)    Nitrite POSITIVE (*)    Leukocytes, UA SMALL (*)    Bacteria, UA RARE (*)    Squamous Epithelial / LPF 0-5 (*)    All other components within normal limits  TROPONIN I    RADIOLOGY Images were viewed by me  IMPRESSION: Bilateral nephrolithiasis. No evidence of ureteral stones or obstruction.  3.3 cm left ovarian cyst.  Minimal aortic atherosclerosis.  ____________________________________________  FINAL ASSESSMENT AND PLAN  Flank pain, Muscle strain, UTI  Plan: Patient with labs and imaging as dictated above. Patient is in no  acute distress and will be treated mostly for musculoskeletal pain. CT scans do not reveal any extra renal stone. She also has a UTI but no findings consistent with pyelonephritis. She is stable for outpatient follow-up.   Earleen Newport, MD   Note: This note was generated in part or whole with voice recognition software. Voice recognition is usually quite accurate but there are transcription errors that can and very often do occur. I apologize for any typographical errors that were not detected and corrected.     Earleen Newport, MD 10/22/16 2218

## 2016-10-22 NOTE — ED Notes (Signed)
ED Provider at bedside. 

## 2016-11-08 ENCOUNTER — Encounter: Payer: Self-pay | Admitting: Emergency Medicine

## 2016-11-08 ENCOUNTER — Emergency Department
Admission: EM | Admit: 2016-11-08 | Discharge: 2016-11-08 | Disposition: A | Discharge status: Home / Self Care | Payer: No Typology Code available for payment source | Attending: Emergency Medicine | Admitting: Emergency Medicine

## 2016-11-08 DIAGNOSIS — F17210 Nicotine dependence, cigarettes, uncomplicated: Secondary | ICD-10-CM | POA: Insufficient documentation

## 2016-11-08 DIAGNOSIS — N390 Urinary tract infection, site not specified: Secondary | ICD-10-CM | POA: Insufficient documentation

## 2016-11-08 DIAGNOSIS — F1290 Cannabis use, unspecified, uncomplicated: Secondary | ICD-10-CM | POA: Insufficient documentation

## 2016-11-08 DIAGNOSIS — Z79899 Other long term (current) drug therapy: Secondary | ICD-10-CM | POA: Insufficient documentation

## 2016-11-08 LAB — BASIC METABOLIC PANEL
Anion gap: 6 (ref 5–15)
BUN: 11 mg/dL (ref 6–20)
CO2: 27 mmol/L (ref 22–32)
Calcium: 8.8 mg/dL — ABNORMAL LOW (ref 8.9–10.3)
Chloride: 104 mmol/L (ref 101–111)
Creatinine, Ser: 0.85 mg/dL (ref 0.44–1.00)
GFR calc non Af Amer: >60 mL/min (ref >60)
Glucose, Bld: 131 mg/dL — ABNORMAL HIGH (ref 65–99)
POTASSIUM: 3.1 mmol/L — AB (ref 3.5–5.1)
SODIUM: 137 mmol/L (ref 135–145)

## 2016-11-08 LAB — CBC
HEMATOCRIT: 45.9 % (ref 35.0–47.0)
Hemoglobin: 16 g/dL (ref 12.0–16.0)
MCH: 36 pg — AB (ref 26.0–34.0)
MCHC: 34.9 g/dL (ref 32.0–36.0)
MCV: 103.1 fL — ABNORMAL HIGH (ref 80.0–100.0)
Platelets: 207 10*3/uL (ref 150–440)
RBC: 4.45 MIL/uL (ref 3.80–5.20)
RDW: 13.8 % (ref 11.5–14.5)
WBC: 8.1 10*3/uL (ref 3.6–11.0)

## 2016-11-08 LAB — URINALYSIS, COMPLETE (UACMP) WITH MICROSCOPIC
Bilirubin Urine: NEGATIVE
GLUCOSE, UA: NEGATIVE mg/dL
Hgb urine dipstick: NEGATIVE
KETONES UR: 5 mg/dL — AB
Nitrite: NEGATIVE
PH: 5 (ref 5.0–8.0)
Protein, ur: NEGATIVE mg/dL
SPECIFIC GRAVITY, URINE: 1.023 (ref 1.005–1.030)

## 2016-11-08 MED ORDER — PHENAZOPYRIDINE HCL 200 MG PO TABS: 200.0000 mg | ORAL_TABLET | Freq: Three times a day (TID) | ORAL | 0 refills | Status: AC | PRN

## 2016-11-08 MED ORDER — CEPHALEXIN 500 MG PO CAPS: 500.0000 mg | ORAL_CAPSULE | Freq: Three times a day (TID) | ORAL | 0 refills | Status: DC

## 2016-11-08 MED ORDER — DEXTROSE 5 % IV SOLN: 1.0000 g | Freq: Once | INTRAVENOUS | Status: DC

## 2016-11-08 MED ORDER — CEFTRIAXONE SODIUM-DEXTROSE 1-3.74 GM-% IV SOLR
1.0000 g | Freq: Once | INTRAVENOUS | Status: AC
  Administered 2016-11-08: 1 g via INTRAVENOUS
  Filled 2016-11-08 (×2): qty 50

## 2016-11-08 NOTE — Discharge Instructions (Signed)
Please seek medical attention for any high fevers, chest pain, shortness of breath, change in behavior, persistent vomiting, bloody stool or any other new or concerning symptoms.  

## 2016-11-08 NOTE — ED Triage Notes (Signed)
Patient presents to ED via POV with c/o right flank pain that began this morning. Hx of kidney stones.

## 2016-11-08 NOTE — ED Provider Notes (Signed)
[facility] Emergency Department Provider Note   ____________________________________________   I have reviewed the triage vital signs and the nursing notes.   HISTORY  Chief Complaint Flank Pain   History limited by: Not Limited   HPI KHRYSTAL JEANMARIE is a 46 y.o. female who presents to the emergency department today because of concerns for back pain. Located primarily in the right back. She describes it as being a sharp pain. It started yesterday. Has been continuing throughout the day. For the past few days patient states she has noticed her urine is been darker. She has no some bad odor to her urine. She does have a history of kidney stones. She denies any fevers.   Past Medical History:  Diagnosis Date  . Abnormal Pap smear 2001   [facility]- told her that she had "pods" didnt get tx  . Anemia    ONLY WITH PREGNANCY  . DUB (dysfunctional uterine bleeding)   . Family history of adverse reaction to anesthesia    MOM-HAS HARD TIME WAKING UP  . Kidney stones 2011, 2012  . Kidney stones     Patient Active Problem List   Diagnosis Date Noted  . Recurrent nephrolithiasis 03/24/2015  . Anxiety 03/21/2013  . DUB (dysfunctional uterine bleeding) 01/07/2013    Past Surgical History:  Procedure Laterality Date  . CYSTOSCOPY W/ URETERAL STENT PLACEMENT Left 04/22/2015   Procedure: CYSTOSCOPY WITH STENT REPLACEMENT;  Surgeon: Collier Flowers, MD;  Location: [facility] ORS;  Service: Urology;  Laterality: Left;  . CYSTOSCOPY/URETEROSCOPY/HOLMIUM LASER/STENT PLACEMENT Left 04/08/2015   Procedure: CYSTOSCOPY/URETEROSCOPY/HOLMIUM LASER/STENT PLACEMENT;  Surgeon: [name], MD;  Location: [facility] ORS;  Service: Urology;  Laterality: Left;  . LITHOTRIPSY    . URETEROSCOPY WITH HOLMIUM LASER LITHOTRIPSY Left 04/22/2015   Procedure: URETEROSCOPY WITH HOLMIUM LASER LITHOTRIPSY;  Surgeon: Collier Flowers, MD;  Location: [facility] ORS;  Service: Urology;  Laterality:  Left;    Prior to Admission medications   Medication Sig Start Date End Date Taking? Authorizing Provider  acetaminophen (TYLENOL) 325 MG suppository Place 325 mg rectally every 4 (four) hours as needed.    Historical Provider, MD  albuterol (PROVENTIL HFA;VENTOLIN HFA) 108 (90 BASE) MCG/ACT inhaler Inhale 2 puffs into the lungs every 6 (six) hours as needed for wheezing or shortness of breath. 05/26/15   Harvest Dark, PA-C  cephALEXin (KEFLEX) 500 MG capsule Take 1 capsule (500 mg total) by mouth 2 (two) times daily. 04/22/15   Collier Flowers, MD  cephALEXin (KEFLEX) 500 MG capsule Take 1 capsule (500 mg total) by mouth 4 (four) times daily. 04/22/15   Collier Flowers, MD  diazepam (VALIUM) 5 MG tablet Take 1 tablet (5 mg total) by mouth every 8 (eight) hours as needed for muscle spasms. 10/22/16   Earleen Newport, MD  HYDROcodone-acetaminophen (NORCO/VICODIN) 5-325 MG per tablet Take 1-2 tablets by mouth every 4 (four) hours as needed for moderate pain or severe pain. 04/08/15   [name], MD  ibuprofen (ADVIL,MOTRIN) 200 MG tablet Take 200 mg by mouth every 6 (six) hours as needed for headache or mild pain.     Historical Provider, MD  ibuprofen (ADVIL,MOTRIN) 800 MG tablet Take 1 tablet (800 mg total) by mouth every 8 (eight) hours as needed. 10/22/16   Earleen Newport, MD  oxybutynin (DITROPAN) 5 MG tablet Take 1 tablet (5 mg total) by mouth every 8 (eight) hours as needed for bladder spasms. 04/08/15   [name],  MD  oxyCODONE-acetaminophen (ROXICET) 5-325 MG per tablet Take 1 tablet by mouth every 4 (four) hours as needed for moderate pain or severe pain. 04/22/15   Collier Flowers, MD  predniSONE (DELTASONE) 10 MG tablet Taper: 6,5,4,3,2,1 05/26/15   Harvest Dark, PA-C  sulfamethoxazole-trimethoprim (BACTRIM DS) 800-160 MG tablet Take 1 tablet by mouth 2 (two) times daily. 10/22/16   Earleen Newport, MD  tamsulosin (FLOMAX) 0.4 MG CAPS capsule Take 1 capsule (0.4 mg total) by  mouth daily. 04/08/15   [name], MD    Allergies Patient has no known allergies.  Family History  Problem Relation Age of Onset  . Cancer Mother     breast  . Cancer Maternal Grandmother     breast, colon  . Diabetes Paternal Grandmother     Social History Social History  Substance Use Topics  . Smoking status: Current Every Day Smoker    Packs/day: 0.50    Years: 15.00    Types: Cigarettes  . Smokeless tobacco: Never Used  . Alcohol use Yes     Comment: RARE    Review of Systems  Constitutional: Negative for fever. Cardiovascular: Negative for chest pain. Respiratory: Negative for shortness of breath. Gastrointestinal: Positive for right flank pain. Genitourinary: Positive for bad odor to her urine. Musculoskeletal: Positive for back pain. Skin: Negative for rash. Neurological: Negative for headaches, focal weakness or numbness.  10-point ROS otherwise negative.  ____________________________________________   PHYSICAL EXAM:  VITAL SIGNS: ED Triage Vitals  Enc Vitals Group     BP 11/08/16 1754 (!) 160/98     Pulse Rate 11/08/16 1754 65     Resp 11/08/16 1754 17     Temp 11/08/16 1754 97.7 F (36.5 C)     Temp Source 11/08/16 1754 Oral     SpO2 11/08/16 1754 100 %     Weight 11/08/16 1754 135 lb (61.2 kg)     Height 11/08/16 1754 5\' 2"  (1.575 m)     Head Circumference --      Peak Flow --      Pain Score 11/08/16 1755 6   Constitutional: Alert and oriented. Well appearing and in no distress. Eyes: Conjunctivae are normal. Normal extraocular movements. ENT   Head: Normocephalic and atraumatic.   Nose: No congestion/rhinnorhea.   Mouth/Throat: Mucous membranes are moist.   Neck: No stridor. Hematological/Lymphatic/Immunilogical: No cervical lymphadenopathy. Cardiovascular: Normal rate, regular rhythm.  No murmurs, rubs, or gallops.  Respiratory: Normal respiratory effort without tachypnea nor retractions. Breath sounds are clear and  equal bilaterally. No wheezes/rales/rhonchi. Gastrointestinal: Soft and non tender. No rebound. No guarding.  Genitourinary: Deferred Musculoskeletal: Normal range of motion in all extremities. No lower extremity edema. Neurologic:  Normal speech and language. No gross focal neurologic deficits are appreciated.  Skin:  Skin is warm, dry and intact. No rash noted. Psychiatric: Mood and affect are normal. Speech and behavior are normal. Patient exhibits appropriate insight and judgment.  ____________________________________________    LABS (pertinent positives/negatives)  Labs Reviewed  URINALYSIS, COMPLETE (UACMP) WITH MICROSCOPIC - Abnormal; Notable for the following:       Result Value   Color, Urine YELLOW (*)    APPearance CLEAR (*)    Ketones, ur 5 (*)    Leukocytes, UA TRACE (*)    Bacteria, UA RARE (*)    Squamous Epithelial / LPF 0-5 (*)    All other components within normal limits  CBC - Abnormal; Notable for the following:  MCV 103.1 (*)    MCH 36.0 (*)    All other components within normal limits  BASIC METABOLIC PANEL - Abnormal; Notable for the following:    Potassium 3.1 (*)    Glucose, Bld 131 (*)    Calcium 8.8 (*)    All other components within normal limits     ____________________________________________   EKG  None  ____________________________________________    RADIOLOGY  None  ____________________________________________   PROCEDURES  Procedures  ____________________________________________   INITIAL IMPRESSION / ASSESSMENT AND PLAN / ED COURSE  Pertinent labs & imaging results that were available during my care of the patient were reviewed by me and considered in my medical decision making (see chart for details).  Patient presented to the emergency department today with concerns for back pain and right flank pain. Urine is consistent with a urinary tract infection. The patient's history is positive for kidney stones. At this  point no blood in the urine. I did discuss with patient possibility of obtaining a CT scan to evaluate for kidney stones. However we did have a discussion about risks and benefits of patient recently had a CT scan performed in the past couple of weeks. This point she felt comfortable deferring CT scan. Will trial antibiotics. Discussed return precautions.  ____________________________________________   FINAL CLINICAL IMPRESSION(S) / ED DIAGNOSES  Final diagnoses:  Lower urinary tract infectious disease     Note: This dictation was prepared with Dragon dictation. Any transcriptional errors that result from this process are unintentional     Nance Pear, MD 11/08/16 2009

## 2017-04-19 IMAGING — CT CT ABD-PELV W/O CM
1 of 2 series · 5 of 32 positions shown, 10 images · non-contrast
Comparison: 04/15/2012.

CLINICAL DATA: Kidney stone. lumbago/low back pain. LEFT lower back
pain for 3 days. Personal history of kidney stones.

EXAM:
CT ABDOMEN AND PELVIS WITHOUT CONTRAST
TECHNIQUE: Multidetector CT imaging of the abdomen and pelvis was performed
following the standard protocol without IV contrast.

[Series 4: lung windows · axial · 0.64mm/px · z∈[-712,-632]mm · 5 of 24 slices shown, 10 images]
[im 4/24  soft-tissue]
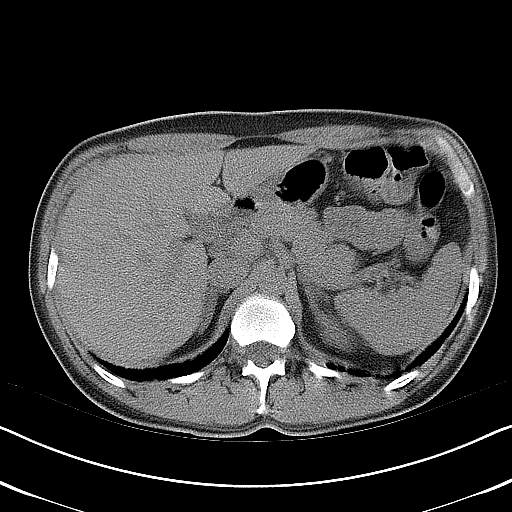
[im 4/24  bone]
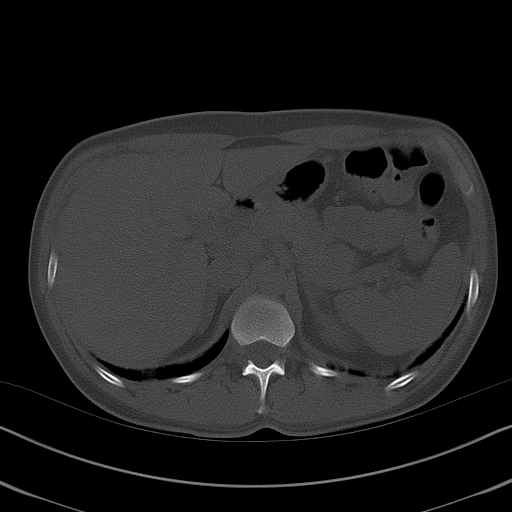
[im 8/24  soft-tissue]
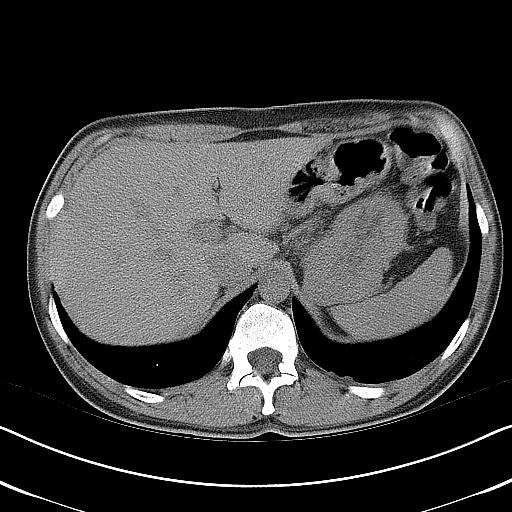
[im 8/24  lung]
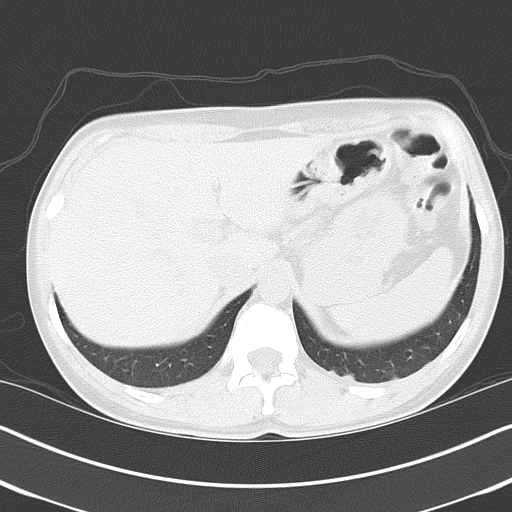
[im 12/24  soft-tissue]
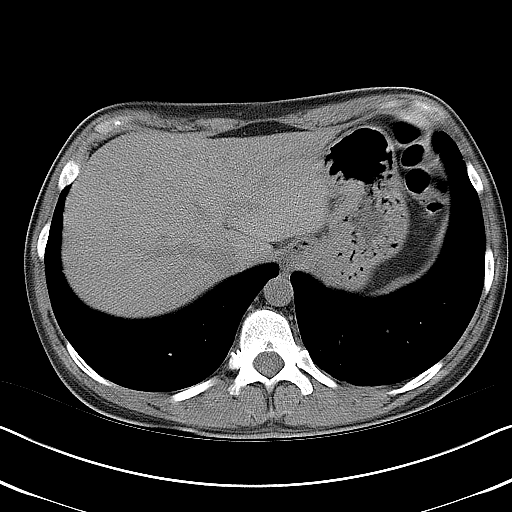
[im 12/24  lung]
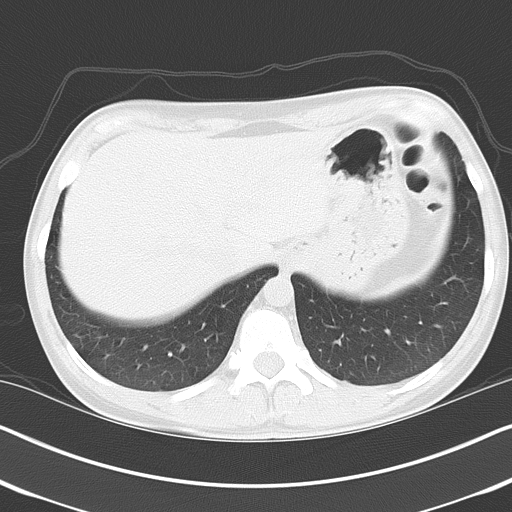
[im 16/24  soft-tissue]
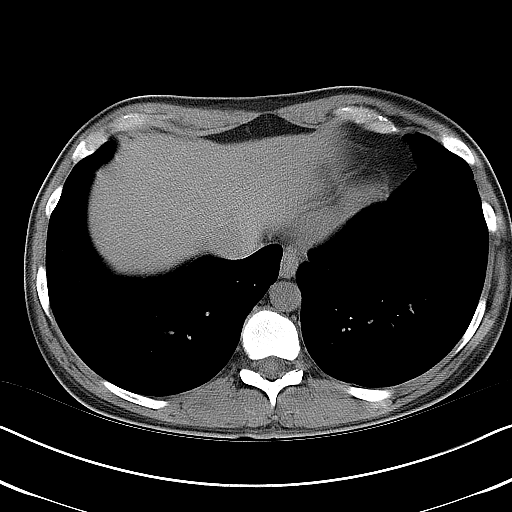
[im 16/24  lung]
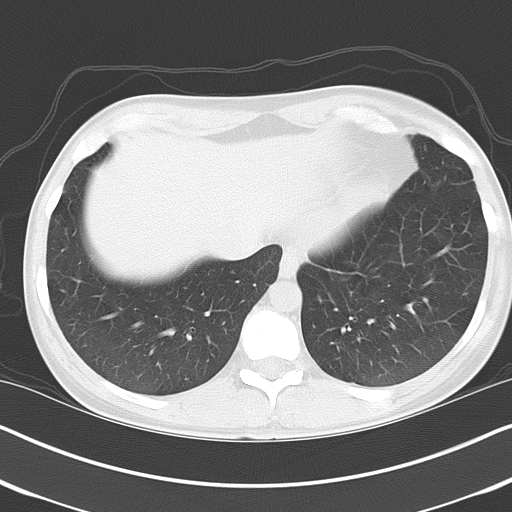
[im 20/24  soft-tissue]
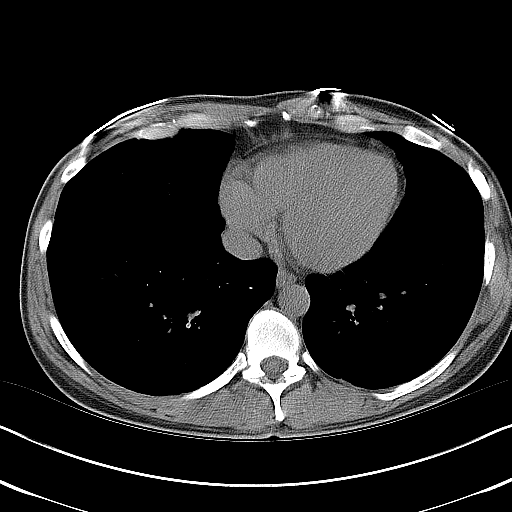
[im 20/24  lung]
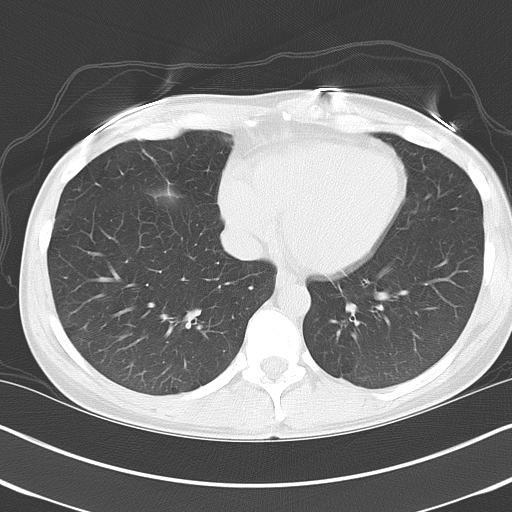

[5 of 32 positions shown; findings below may reference images not displayed]

FINDINGS: Musculoskeletal: No aggressive osseous lesions. Sacralization of the
LEFT L5 transverse process.

Lung Bases: Within normal limits.  Mild dependent atelectasis.

Liver: Unenhanced CT was performed per clinician order. Lack of IV
contrast limits sensitivity and specificity, especially for
evaluation of abdominal/pelvic solid viscera. Grossly normal.

Spleen:  Normal.

Gallbladder:  Partially contracted.  No calcified stones.

Common bile duct:  Normal.

Pancreas:  Normal.

Adrenal glands:  Normal bilaterally.

Kidneys: Staghorn calculus is present in the LEFT inferior renal
pole. LEFT ureter is normal. No hydronephrosis. Punctate RIGHT
inferior pole nonobstructing renal collecting system calculus. The
RIGHT ureter is normal.

Stomach:  Grossly normal.

Small bowel:  Normal.

Colon: Appendix not identified. Fluid levels are present in the
proximal colon which are abnormal but nonspecific. Distal colon
collapsed.

Pelvic Genitourinary:  Normal.

Peritoneum: No free air or free fluid.

Vascular/lymphatic: Atherosclerosis. No gross acute vascular
abnormality.

Body Wall: Umbilical jewelry present.
IMPRESSION: 1. Air-fluid levels in the colon which are abnormal but nonspecific,
most commonly associated with enteric infection.
2. Nonobstructing bilateral renal collecting system calculi with
staghorn calculus in the LEFT inferior renal pole. Increased stone
activity compared to prior exam from 04/15/2012.

## 2017-09-24 ENCOUNTER — Emergency Department
Admission: EM | Admit: 2017-09-24 | Discharge: 2017-09-24 | Disposition: A | Discharge status: Home / Self Care | Payer: BLUE CROSS/BLUE SHIELD | Attending: Emergency Medicine | Admitting: Emergency Medicine

## 2017-09-24 ENCOUNTER — Encounter: Payer: Self-pay | Admitting: Emergency Medicine

## 2017-09-24 DIAGNOSIS — S61210A Laceration without foreign body of right index finger without damage to nail, initial encounter: Secondary | ICD-10-CM | POA: Insufficient documentation

## 2017-09-24 DIAGNOSIS — S6991XA Unspecified injury of right wrist, hand and finger(s), initial encounter: Secondary | ICD-10-CM | POA: Diagnosis present

## 2017-09-24 DIAGNOSIS — S61011A Laceration without foreign body of right thumb without damage to nail, initial encounter: Secondary | ICD-10-CM

## 2017-09-24 DIAGNOSIS — Y999 Unspecified external cause status: Secondary | ICD-10-CM | POA: Insufficient documentation

## 2017-09-24 DIAGNOSIS — W25XXXA Contact with sharp glass, initial encounter: Secondary | ICD-10-CM | POA: Insufficient documentation

## 2017-09-24 DIAGNOSIS — F17210 Nicotine dependence, cigarettes, uncomplicated: Secondary | ICD-10-CM | POA: Insufficient documentation

## 2017-09-24 DIAGNOSIS — Y929 Unspecified place or not applicable: Secondary | ICD-10-CM | POA: Insufficient documentation

## 2017-09-24 DIAGNOSIS — Z79899 Other long term (current) drug therapy: Secondary | ICD-10-CM | POA: Insufficient documentation

## 2017-09-24 DIAGNOSIS — Z23 Encounter for immunization: Secondary | ICD-10-CM | POA: Insufficient documentation

## 2017-09-24 DIAGNOSIS — Y9389 Activity, other specified: Secondary | ICD-10-CM | POA: Diagnosis not present

## 2017-09-24 MED ORDER — LIDOCAINE-EPINEPHRINE-TETRACAINE (LET) SOLUTION
3.0000 mL | Freq: Once | NASAL | Status: AC
  Administered 2017-09-24: 3 mL via TOPICAL
  Filled 2017-09-24: qty 3

## 2017-09-24 MED ORDER — TETANUS-DIPHTH-ACELL PERTUSSIS 5-2.5-18.5 LF-MCG/0.5 IM SUSP
0.5000 mL | Freq: Once | INTRAMUSCULAR | Status: AC
  Administered 2017-09-24: 0.5 mL via INTRAMUSCULAR
  Filled 2017-09-24: qty 0.5

## 2017-09-24 NOTE — Discharge Instructions (Signed)
You may shower and get the incision sites wet.  Do not submerge underwater.  Keep laceration sites clean.  Follow-up with primary care provider, urgent care facility in 7-10 days for suture removal.  Allow Dermabond to come off on its own.  Follow-up sooner for any redness, pain or drainage.  Alternate Tylenol and ibuprofen as needed for pain.

## 2017-09-24 NOTE — ED Notes (Signed)
See triage note  States she picked a piece of paper which had a broken glass in it  Laceration noted to right thumb and index finger

## 2017-09-24 NOTE — ED Triage Notes (Signed)
Pt comes into the ED via POV c/o laceration to the right thumb and index finger after picking up trash that had a piece of glass in it.  All bleeding under control at this time and patient in NAD with even and unlabored respirations and is ambulatory to triage.

## 2017-09-24 NOTE — ED Provider Notes (Signed)
[place] EMERGENCY DEPARTMENT Provider Note   CSN: [id number] Arrival date & time: 09/24/17  1235     History   Chief Complaint Chief Complaint  Patient presents with  . Laceration    HPI Brittany Herrera is a 47 y.o. female presents to the emergency department for evaluation of a laceration to the right thumb and right index finger.  Laceration occurred just prior to arrival.  She was picking up a large piece of glass when she cut her 2 fingers.  Piece of glass stayed intact no concern for foreign body.  She denies any significant pain or discomfort.  Her tetanus is not up-to-date.  No numbness or tingling.  HPI  Past Medical History:  Diagnosis Date  . Abnormal Pap smear 2001   [facility]- told her that she had "pods" didnt get tx  . Anemia    ONLY WITH PREGNANCY  . DUB (dysfunctional uterine bleeding)   . Family history of adverse reaction to anesthesia    MOM-HAS HARD TIME WAKING UP  . Kidney stones 2011, 2012  . Kidney stones     Patient Active Problem List   Diagnosis Date Noted  . Recurrent nephrolithiasis 03/24/2015  . Anxiety 03/21/2013  . DUB (dysfunctional uterine bleeding) 01/07/2013    Past Surgical History:  Procedure Laterality Date  . CYSTOSCOPY W/ URETERAL STENT PLACEMENT Left 04/22/2015   Procedure: CYSTOSCOPY WITH STENT REPLACEMENT;  Surgeon: Collier Flowers, MD;  Location: [facility] ORS;  Service: Urology;  Laterality: Left;  . CYSTOSCOPY/URETEROSCOPY/HOLMIUM LASER/STENT PLACEMENT Left 04/08/2015   Procedure: CYSTOSCOPY/URETEROSCOPY/HOLMIUM LASER/STENT PLACEMENT;  Surgeon: [name], MD;  Location: [facility] OR[facility];  Service: Urology;  Laterality: Left;  . LITHOTRIPSY    . URETEROSCOPY WITH HOLMIUM LASER LITHOTRIPSY Left 04/22/2015   Procedure: URETEROSCOPY WITH HOLMIUM LASER LITHOTRIPSY;  Surgeon: Collier Flowers, MD;  Location: [facility] OR[facility];  Service: Urology;  Laterality: Left;    OB History    Gravida Para Term Preterm AB  Living   1 1   1  0 1   SAB TAB Ectopic Multiple Live Births   0 0 0 0 1       Home Medications    Prior to Admission medications   Medication Sig Start Date End Date Taking? Authorizing Provider  acetaminophen (TYLENOL) 325 MG suppository Place 325 mg rectally every 4 (four) hours as needed.    [provider]  albuterol (PROVENTIL HFA;VENTOLIN HFA) 108 (90 BASE) MCG/ACT inhaler Inhale 2 puffs into the lungs every 6 (six) hours as needed for wheezing or shortness of breath. 05/26/15   Harvest Dark, PA-C  cephALEXin (KEFLEX) 500 MG capsule Take 1 capsule (500 mg total) by mouth 2 (two) times daily. 04/22/15   Collier Flowers, MD  cephALEXin (KEFLEX) 500 MG capsule Take 1 capsule (500 mg total) by mouth 4 (four) times daily. 04/22/15   Collier Flowers, MD  cephALEXin (KEFLEX) 500 MG capsule Take 1 capsule (500 mg total) by mouth 3 (three) times daily. 11/08/16   Nance Pear, MD  diazepam (VALIUM) 5 MG tablet Take 1 tablet (5 mg total) by mouth every 8 (eight) hours as needed for muscle spasms. 10/22/16   Earleen Newport, MD  HYDROcodone-acetaminophen (NORCO/VICODIN) 5-325 MG per tablet Take 1-2 tablets by mouth every 4 (four) hours as needed for moderate pain or severe pain. 04/08/15   [name], MD  ibuprofen (ADVIL,MOTRIN) 200 MG tablet Take 200 mg by mouth every 6 (six) hours  as needed for headache or mild pain.     [provider]  ibuprofen (ADVIL,MOTRIN) 800 MG tablet Take 1 tablet (800 mg total) by mouth every 8 (eight) hours as needed. 10/22/16   Earleen Newport, MD  oxybutynin (DITROPAN) 5 MG tablet Take 1 tablet (5 mg total) by mouth every 8 (eight) hours as needed for bladder spasms. 04/08/15   [name], MD  oxyCODONE-acetaminophen (ROXICET) 5-325 MG per tablet Take 1 tablet by mouth every 4 (four) hours as needed for moderate pain or severe pain. 04/22/15   Collier Flowers, MD  phenazopyridine (PYRIDIUM) 200 MG tablet Take 1 tablet (200 mg  total) by mouth 3 (three) times daily as needed for pain. 11/08/16 11/08/17  Nance Pear, MD  predniSONE (DELTASONE) 10 MG tablet Taper: 6,5,4,3,2,1 05/26/15   Harvest Dark, PA-C  sulfamethoxazole-trimethoprim (BACTRIM DS) 800-160 MG tablet Take 1 tablet by mouth 2 (two) times daily. 10/22/16   Earleen Newport, MD  tamsulosin (FLOMAX) 0.4 MG CAPS capsule Take 1 capsule (0.4 mg total) by mouth daily. 04/08/15   [name], MD    Family History Family History  Problem Relation Age of Onset  . Cancer Mother        breast  . Cancer Maternal Grandmother        breast, colon  . Diabetes Paternal Grandmother     Social History Social History   Tobacco Use  . Smoking status: Current Every Day Smoker    Packs/day: 0.50    Years: 15.00    Pack years: 7.50    Types: Cigarettes  . Smokeless tobacco: Never Used  Substance Use Topics  . Alcohol use: Yes    Comment: RARE  . Drug use: Yes    Types: Marijuana    Comment: patient no longer smoking     Allergies   Patient has no known allergies.   Review of Systems Review of Systems  Musculoskeletal: Negative for myalgias.  Skin: Positive for wound. Negative for rash.  Neurological: Negative for numbness.     Physical Exam Updated Vital Signs BP (!) 151/92 (BP Location: Left Arm)   Pulse 76   Temp 98.2 F (36.8 C) (Oral)   Resp 16   Ht 5\' 4"  (1.626 m)   Wt 77.1 kg (170 lb)   SpO2 97%   BMI 29.18 kg/m   Physical Exam  Constitutional: She is oriented to person, place, and time. She appears well-developed and well-nourished.  HENT:  Head: Normocephalic and atraumatic.  Eyes: Conjunctivae are normal.  Neck: Normal range of motion.  Cardiovascular: Normal rate and regular rhythm.  Pulmonary/Chest: Effort normal. No respiratory distress.  Musculoskeletal: Normal range of motion.  Examination of the right thumb and right index finger shows right thumb has a laceration along the pulp space that is 2 and half  centimeters in length, not gaping and linear.  No visible or palpable foreign body.  Sensation is intact.  She has good range of motion of the interphalangeal joint.  Index finger of the right hand shows small puncture wound 0.5 cm with no visible or palpable foreign body.  No tendon deficits noted.  No trauma or injury to the nails.  Neurological: She is alert and oriented to person, place, and time.     ED Treatments / Results  Labs (all labs ordered are listed, but only abnormal results are displayed) Labs Reviewed - No data to display  EKG  EKG Interpretation None  Radiology No results found.  Procedures .Marland KitchenLaceration Repair Date/Time: 09/24/2017 2:11 PM Performed by: Duanne Guess, PA-C Authorized by: Duanne Guess, PA-C   Consent:    Consent obtained:  Verbal   Consent given by:  Patient   Risks discussed:  Infection, pain, retained foreign body, poor cosmetic result and poor wound healing Anesthesia (see MAR for exact dosages):    Anesthesia method:  Topical application   Topical anesthetic:  LET Laceration details:    Location:  Finger   Finger location:  R thumb   Length (cm):  2   Depth (mm):  2 Repair type:    Repair type:  Simple Pre-procedure details:    Preparation:  Patient was prepped and draped in usual sterile fashion Exploration:    Hemostasis achieved with:  LET   Wound exploration: entire depth of wound probed and visualized     Contaminated: no   Treatment:    Area cleansed with:  Saline   Amount of cleaning:  Extensive   Irrigation solution:  Sterile saline   Visualized foreign bodies/material removed: no   Skin repair:    Repair method:  Sutures   Suture size:  5-0   Number of sutures:  3 Approximation:    Approximation:  Close   Vermilion border: well-aligned   Post-procedure details:    Dressing:  Sterile dressing   Patient tolerance of procedure:  Tolerated well, no immediate complications .Marland KitchenLaceration Repair Date/Time:  09/24/2017 2:12 PM Performed by: Duanne Guess, PA-C Authorized by: Duanne Guess, PA-C   Consent:    Consent obtained:  Verbal   Consent given by:  Patient   Risks discussed:  Infection   Alternatives discussed:  No treatment Laceration details:    Location:  Finger   Finger location:  R index finger   Length (cm):  0.5   Depth (mm):  2 Treatment:    Area cleansed with:  Betadine   Irrigation method:  Syringe Skin repair:    Repair method:  Tissue adhesive Approximation:    Approximation:  Close   Vermilion border: well-aligned     (including critical care time)  Medications Ordered in ED Medications  lidocaine-EPINEPHrine-tetracaine (LET) solution (3 mLs Topical Given 09/24/17 1321)  Tdap (BOOSTRIX) injection 0.5 mL (0.5 mLs Intramuscular Given 09/24/17 1322)     Initial Impression / Assessment and Plan / ED Course  I have reviewed the triage vital signs and the nursing notes.  Pertinent labs & imaging results that were available during my care of the patient were reviewed by me and considered in my medical decision making (see chart for details).     47 year old female with laceration to the right thumb, right index finger.  Dermabond was used on the right index finger small laceration and the larger right thumb laceration was repaired with sutures.  She is educated on wound care.  Both wounds were thoroughly irrigated and cleansed with Betadine.  She will follow-up status days for suture removal.  Final Clinical Impressions(s) / ED Diagnoses   Final diagnoses:  Laceration of right thumb without foreign body without damage to nail, initial encounter  Laceration of right index finger without foreign body without damage to nail, initial encounter    ED Discharge Orders    None       Renata Caprice 09/24/17 1420    Nena Polio, MD 10/04/17 [phone]

## 2017-10-28 ENCOUNTER — Emergency Department
Admission: EM | Admit: 2017-10-28 | Discharge: 2017-10-29 | Disposition: A | Discharge status: Home / Self Care | Payer: BLUE CROSS/BLUE SHIELD | Attending: Emergency Medicine | Admitting: Emergency Medicine

## 2017-10-28 ENCOUNTER — Other Ambulatory Visit: Payer: Self-pay

## 2017-10-28 ENCOUNTER — Encounter: Payer: Self-pay | Admitting: Emergency Medicine

## 2017-10-28 ENCOUNTER — Emergency Department: Payer: BLUE CROSS/BLUE SHIELD

## 2017-10-28 DIAGNOSIS — S39012A Strain of muscle, fascia and tendon of lower back, initial encounter: Secondary | ICD-10-CM | POA: Diagnosis not present

## 2017-10-28 DIAGNOSIS — X58XXXA Exposure to other specified factors, initial encounter: Secondary | ICD-10-CM | POA: Diagnosis not present

## 2017-10-28 DIAGNOSIS — Y939 Activity, unspecified: Secondary | ICD-10-CM | POA: Diagnosis not present

## 2017-10-28 DIAGNOSIS — Y999 Unspecified external cause status: Secondary | ICD-10-CM | POA: Diagnosis not present

## 2017-10-28 DIAGNOSIS — S3992XA Unspecified injury of lower back, initial encounter: Secondary | ICD-10-CM | POA: Diagnosis present

## 2017-10-28 DIAGNOSIS — F17210 Nicotine dependence, cigarettes, uncomplicated: Secondary | ICD-10-CM | POA: Diagnosis not present

## 2017-10-28 DIAGNOSIS — Y929 Unspecified place or not applicable: Secondary | ICD-10-CM | POA: Diagnosis not present

## 2017-10-28 DIAGNOSIS — N200 Calculus of kidney: Secondary | ICD-10-CM | POA: Diagnosis not present

## 2017-10-28 LAB — URINALYSIS, COMPLETE (UACMP) WITH MICROSCOPIC
Bilirubin Urine: NEGATIVE
Glucose, UA: NEGATIVE mg/dL
Hgb urine dipstick: NEGATIVE
Ketones, ur: NEGATIVE mg/dL
Nitrite: NEGATIVE
Protein, ur: NEGATIVE mg/dL
Specific Gravity, Urine: 1.015 (ref 1.005–1.030)
pH: 6 (ref 5.0–8.0)

## 2017-10-28 LAB — PREGNANCY, URINE: Preg Test, Ur: NEGATIVE

## 2017-10-28 MED ORDER — TRAMADOL HCL 50 MG PO TABS
50.0000 mg | ORAL_TABLET | Freq: Once | ORAL | Status: AC
  Administered 2017-10-28: 50 mg via ORAL
  Filled 2017-10-28: qty 1

## 2017-10-28 MED ORDER — TRAMADOL HCL 50 MG PO TABS: 50.0000 mg | ORAL_TABLET | Freq: Four times a day (QID) | ORAL | 0 refills | Status: AC | PRN

## 2017-10-28 MED ORDER — KETOROLAC TROMETHAMINE 30 MG/ML IJ SOLN
30.0000 mg | Freq: Once | INTRAMUSCULAR | Status: AC
  Administered 2017-10-28: 30 mg via INTRAMUSCULAR
  Filled 2017-10-28: qty 1

## 2017-10-28 MED ORDER — MELOXICAM 15 MG PO TABS: 15.0000 mg | ORAL_TABLET | Freq: Every day | ORAL | 1 refills | Status: AC

## 2017-10-28 NOTE — ED Notes (Signed)
Patient reports having lower back pain for several day.  Patient states unsure if kidney stones or back spasms.  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  States kidney stones last year and had 7 removed, but they were unable to get them all.

## 2017-10-28 NOTE — ED Provider Notes (Signed)
[facility] Emergency Department Provider Note  ____________________________________________  Time seen: Approximately 11:47 PM  I have reviewed the triage vital signs and the nursing notes.   HISTORY  Chief Complaint Back Pain    HPI Brittany Herrera is a 47 y.o. female presents to the emergency department with 10 out of 10 bilateral and nonradiating low back pain.  Patient describes her pain as a throbbing sensation.  Patient reports that she has experienced similar symptoms in the past only when she was having a kidney stone.  She denies dysuria, hematuria, increased urinary frequency, nausea and vomiting.  Patient reports having to have lithotripsy 3 times in the past.  No dyspareunia or changes in vaginal discharge.  Patient denies the possibility of pregnancy.  No alleviating measures of been attempted.  Past Medical History:  Diagnosis Date  . Abnormal Pap smear 2001   [place] [facility]- told her that she had "pods" didnt get tx  . Anemia    ONLY WITH PREGNANCY  . DUB (dysfunctional uterine bleeding)   . Family history of adverse reaction to anesthesia    MOM-HAS HARD TIME WAKING UP  . Kidney stones 2011, 2012  . Kidney stones     Patient Active Problem List   Diagnosis Date Noted  . Recurrent nephrolithiasis 03/24/2015  . Anxiety 03/21/2013  . DUB (dysfunctional uterine bleeding) 01/07/2013    Past Surgical History:  Procedure Laterality Date  . CYSTOSCOPY W/ URETERAL STENT PLACEMENT Left 04/22/2015   Procedure: CYSTOSCOPY WITH STENT REPLACEMENT;  Surgeon: Collier Flowers, MD;  Location: [facility] ORS;  Service: Urology;  Laterality: Left;  . CYSTOSCOPY/URETEROSCOPY/HOLMIUM LASER/STENT PLACEMENT Left 04/08/2015   Procedure: CYSTOSCOPY/URETEROSCOPY/HOLMIUM LASER/STENT PLACEMENT;  Surgeon: [name], MD;  Location: [facility] ORS;  Service: Urology;  Laterality: Left;  . LITHOTRIPSY    . URETEROSCOPY WITH HOLMIUM LASER LITHOTRIPSY Left 04/22/2015    Procedure: URETEROSCOPY WITH HOLMIUM LASER LITHOTRIPSY;  Surgeon: Collier Flowers, MD;  Location: [facility] ORS;  Service: Urology;  Laterality: Left;    Prior to Admission medications   Medication Sig Start Date End Date Taking? Authorizing Provider  acetaminophen (TYLENOL) 325 MG suppository Place 325 mg rectally every 4 (four) hours as needed.    [provider]  albuterol (PROVENTIL HFA;VENTOLIN HFA) 108 (90 BASE) MCG/ACT inhaler Inhale 2 puffs into the lungs every 6 (six) hours as needed for wheezing or shortness of breath. 05/26/15   Harvest Dark, PA-C  cephALEXin (KEFLEX) 500 MG capsule Take 1 capsule (500 mg total) by mouth 2 (two) times daily. 04/22/15   Collier Flowers, MD  cephALEXin (KEFLEX) 500 MG capsule Take 1 capsule (500 mg total) by mouth 4 (four) times daily. 04/22/15   Collier Flowers, MD  cephALEXin (KEFLEX) 500 MG capsule Take 1 capsule (500 mg total) by mouth 3 (three) times daily. 11/08/16   Nance Pear, MD  diazepam (VALIUM) 5 MG tablet Take 1 tablet (5 mg total) by mouth every 8 (eight) hours as needed for muscle spasms. 10/22/16   Earleen Newport, MD  HYDROcodone-acetaminophen (NORCO/VICODIN) 5-325 MG per tablet Take 1-2 tablets by mouth every 4 (four) hours as needed for moderate pain or severe pain. 04/08/15   [name], MD  ibuprofen (ADVIL,MOTRIN) 200 MG tablet Take 200 mg by mouth every 6 (six) hours as needed for headache or mild pain.     [provider]  ibuprofen (ADVIL,MOTRIN) 800 MG tablet Take 1 tablet (800 mg total) by mouth every 8 (  eight) hours as needed. 10/22/16   Earleen Newport, MD  meloxicam (MOBIC) 15 MG tablet Take 1 tablet (15 mg total) by mouth daily for 7 days. 10/28/17 11/04/17  Lannie Fields, PA-C  oxybutynin (DITROPAN) 5 MG tablet Take 1 tablet (5 mg total) by mouth every 8 (eight) hours as needed for bladder spasms. 04/08/15   [name], MD  oxyCODONE-acetaminophen (ROXICET) 5-325 MG per tablet Take 1 tablet by  mouth every 4 (four) hours as needed for moderate pain or severe pain. 04/22/15   Collier Flowers, MD  phenazopyridine (PYRIDIUM) 200 MG tablet Take 1 tablet (200 mg total) by mouth 3 (three) times daily as needed for pain. 11/08/16 11/08/17  Nance Pear, MD  predniSONE (DELTASONE) 10 MG tablet Taper: 6,5,4,3,2,1 05/26/15   Harvest Dark, PA-C  sulfamethoxazole-trimethoprim (BACTRIM DS) 800-160 MG tablet Take 1 tablet by mouth 2 (two) times daily. 10/22/16   Earleen Newport, MD  tamsulosin (FLOMAX) 0.4 MG CAPS capsule Take 1 capsule (0.4 mg total) by mouth daily. 04/08/15   [name], MD  traMADol (ULTRAM) 50 MG tablet Take 1 tablet (50 mg total) by mouth every 6 (six) hours as needed for up to 3 days. 10/28/17 10/31/17  Lannie Fields, PA-C    Allergies Patient has no known allergies.  Family History  Problem Relation Age of Onset  . Cancer Mother        breast  . Cancer Maternal Grandmother        breast, colon  . Diabetes Paternal Grandmother     Social History Social History   Tobacco Use  . Smoking status: Current Every Day Smoker    Packs/day: 1.00    Years: 15.00    Pack years: 15.00    Types: Cigarettes  . Smokeless tobacco: Never Used  Substance Use Topics  . Alcohol use: Yes    Comment: RARE  . Drug use: Yes    Types: Marijuana    Comment: patient no longer smoking     Review of Systems  Constitutional: No fever/chills Eyes: No visual changes. No discharge ENT: No upper respiratory complaints. Cardiovascular: no chest pain. Respiratory: no cough. No SOB. Gastrointestinal: No abdominal pain.  No nausea, no vomiting.  No diarrhea.  No constipation. Musculoskeletal: Patient has low back pain. Skin: Negative for rash, abrasions, lacerations, ecchymosis. Neurological: Negative for headaches, focal weakness or numbness.   ____________________________________________   PHYSICAL EXAM:  VITAL SIGNS: ED Triage Vitals  Enc Vitals Group     BP  10/28/17 1944 (!) 141/93     Pulse Rate 10/28/17 1944 75     Resp 10/28/17 1944 16     Temp 10/28/17 1944 98.5 F (36.9 C)     Temp src --      SpO2 10/28/17 1944 98 %     Weight 10/28/17 1945 160 lb (72.6 kg)     Height 10/28/17 1945 5\' 4"  (1.626 m)     Head Circumference --      Peak Flow --      Pain Score 10/28/17 1945 5     Pain Loc --      Pain Edu? --      Excl. in [place]? --      Constitutional: Alert and oriented. Well appearing and in no acute distress. Eyes: Conjunctivae are normal. PERRL. EOMI. Head: Atraumatic. Cardiovascular: Normal rate, regular rhythm. Normal S1 and S2.  Good peripheral circulation. Respiratory: Normal respiratory effort without tachypnea or retractions.  Lungs CTAB. Good air entry to the bases with no decreased or absent breath sounds. Gastrointestinal: Bowel sounds 4 quadrants. Soft and nontender to palpation. No guarding or rigidity. No palpable masses. No distention. No CVA tenderness. Musculoskeletal: Full range of motion to all extremities. No gross deformities appreciated. Neurologic:  Normal speech and language. No gross focal neurologic deficits are appreciated.  Skin:  Skin is warm, dry and intact. No rash noted. Psychiatric: Mood and affect are normal. Speech and behavior are normal. Patient exhibits appropriate insight and judgement.   ____________________________________________   LABS (all labs ordered are listed, but only abnormal results are displayed)  Labs Reviewed  URINALYSIS, COMPLETE (UACMP) WITH MICROSCOPIC - Abnormal; Notable for the following components:      Result Value   Color, Urine YELLOW (*)    APPearance CLEAR (*)    Leukocytes, UA TRACE (*)    Bacteria, UA FEW (*)    Squamous Epithelial / LPF 0-5 (*)    All other components within normal limits  PREGNANCY, URINE   ____________________________________________  EKG   ____________________________________________  RADIOLOGY Unk Pinto, personally  viewed and evaluated these images (plain radiographs) as part of my medical decision making, as well as reviewing the written report by the radiologist.  Ct Renal Stone Study  Result Date: 10/28/2017 CLINICAL DATA:  Low back pain for several days. History of urinary tract stones. EXAM: CT ABDOMEN AND PELVIS WITHOUT CONTRAST TECHNIQUE: Multidetector CT imaging of the abdomen and pelvis was performed following the standard protocol without IV contrast. COMPARISON:  CT abdomen and pelvis 10/22/2016. FINDINGS: Lower chest: Lung bases are clear. No pleural or pericardial effusion. Hepatobiliary: No focal liver abnormality is seen. No gallstones, gallbladder wall thickening, or biliary dilatation. Pancreas: Unremarkable. No pancreatic ductal dilatation or surrounding inflammatory changes. Spleen: Normal in size without focal abnormality. Adrenals/Urinary Tract: Adrenal glands appear normal. Bilateral renal stones are again seen, much more numerous on the left where there is a cluster of stones in the lower pole of the kidney. No hydronephrosis on the right or left. No ureteral or urinary bladder stones. Small right renal cyst incidentally noted. Stomach/Bowel: Stomach is within normal limits. Appendix appears normal. No evidence of bowel wall thickening, distention, or inflammatory changes. Vascular/Lymphatic: A few atherosclerotic calcifications are identified. Reproductive: Uterus and bilateral adnexa are unremarkable. Other: No ascites.  Fat containing umbilical hernia noted. Musculoskeletal: Negative. IMPRESSION: No acute abnormality. Bilateral nonobstructing renal stones, more numerous in the left kidney. Atherosclerosis. Fat containing umbilical hernia. Electronically Signed   By: Inge Rise M.D.   On: 10/28/2017 22:48    ____________________________________________    PROCEDURES  Procedure(s) performed:    Procedures    Medications  traMADol (ULTRAM) tablet 50 mg (not administered)   ketorolac (TORADOL) 30 MG/ML injection 30 mg (not administered)     ____________________________________________   INITIAL IMPRESSION / ASSESSMENT AND PLAN / ED COURSE  Pertinent labs & imaging results that were available during my care of the patient were reviewed by me and considered in my medical decision making (see chart for details).  Review of the [place] CSRS was performed in accordance of the [place] prior to dispensing any controlled drugs.    Assessment and plan Low back pain Patient presents to the emergency department with bilateral low back pain.  Differential diagnosis included pyelonephritis, acute cystitis and nephrolithiasis and lumbar strain.  Workup conducted in the emergency department was noncontributory for acute cystitis, nephrolithiasis and pyelonephritis.  Patient was treated empirically  for a lumbar strain with tramadol and Toradol in the emergency department.  Patient was discharged with tramadol and meloxicam.  She was advised to follow-up with primary care as needed.  All patient questions were answered.   ____________________________________________  FINAL CLINICAL IMPRESSION(S) / ED DIAGNOSES  Final diagnoses:  Strain of lumbar region, initial encounter      NEW MEDICATIONS STARTED DURING THIS VISIT:  ED Discharge Orders        Ordered    traMADol (ULTRAM) 50 MG tablet  Every 6 hours PRN     10/28/17 2328    meloxicam (MOBIC) 15 MG tablet  Daily     10/28/17 2328          This chart was dictated using voice recognition software/Dragon. Despite best efforts to proofread, errors can occur which can change the meaning. Any change was purely unintentional.    Lannie Fields, PA-C 10/28/17 2351    Lavonia Drafts, MD 10/29/17 [phone]

## 2017-10-28 NOTE — ED Triage Notes (Signed)
Back pain from her "staghorn kidney stones". Has been to urologist for this in past but sees no one currently

## 2017-11-13 ENCOUNTER — Other Ambulatory Visit: Payer: Self-pay

## 2017-11-13 ENCOUNTER — Emergency Department
Admission: EM | Admit: 2017-11-13 | Discharge: 2017-11-13 | Disposition: A | Discharge status: Home / Self Care | Payer: BLUE CROSS/BLUE SHIELD | Attending: Emergency Medicine | Admitting: Emergency Medicine

## 2017-11-13 ENCOUNTER — Emergency Department: Payer: BLUE CROSS/BLUE SHIELD

## 2017-11-13 DIAGNOSIS — Z79899 Other long term (current) drug therapy: Secondary | ICD-10-CM | POA: Diagnosis not present

## 2017-11-13 DIAGNOSIS — M25511 Pain in right shoulder: Secondary | ICD-10-CM | POA: Diagnosis not present

## 2017-11-13 DIAGNOSIS — F17210 Nicotine dependence, cigarettes, uncomplicated: Secondary | ICD-10-CM | POA: Diagnosis not present

## 2017-11-13 MED ORDER — HYDROCODONE-ACETAMINOPHEN 5-325 MG PO TABS
1.0000 | ORAL_TABLET | Freq: Once | ORAL | Status: AC
  Administered 2017-11-13: 1 via ORAL
  Filled 2017-11-13: qty 1

## 2017-11-13 MED ORDER — ETODOLAC 400 MG PO TABS: 400.0000 mg | ORAL_TABLET | Freq: Two times a day (BID) | ORAL | 0 refills | Status: DC

## 2017-11-13 MED ORDER — HYDROCODONE-ACETAMINOPHEN 5-325 MG PO TABS: 1.0000 | ORAL_TABLET | Freq: Every evening | ORAL | 0 refills | Status: DC | PRN

## 2017-11-13 NOTE — Discharge Instructions (Signed)
Follow-up with Dr. Mack Guise who is the orthopedist on call.  You will need to call his office to make an appointment.  Etodolac 400 mg twice daily with food.  May use ice to your shoulder as needed for pain.  No lifting weights but you may exercise lower body if desired. Where shoulder sling as needed but continue to use your joint often to keep from getting stiff or frozen.

## 2017-11-13 NOTE — ED Notes (Signed)
Patient presents to the ED with right shoulder pain that woke her up around 11:30pm last night.  Patient states, "I walk in my sleep and I fight in my sleep, who knows what could have happened."  Patient has very little range of motion in her right shoulder.  Patient holding her right arm.

## 2017-11-13 NOTE — ED Provider Notes (Signed)
[facility] Emergency Department Provider Note  ____________________________________________   First MD Initiated Contact with Patient 11/13/17 1140     (approximate)  I have reviewed the triage vital signs and the nursing notes.   HISTORY  Chief Complaint Shoulder Pain   HPI Brittany Herrera is a 47 y.o. female is here with complaint of right shoulder pain.  Patient states that she woke this morning with her pain and does not recall any injury.  She does state that 2 days ago she was lifting weights but no injury occurred at that time.  Patient has continued to have pain in her right upper arm.  She denies any paresthesias but does limit her range of motion secondary to increased pain.  She has not taken any over-the-counter medication this morning for her pain.  She rates her pain as a 10/10 at this time.   Past Medical History:  Diagnosis Date  . Abnormal Pap smear 2001   [place] [facility]- told her that she had "pods" didnt get tx  . Anemia    ONLY WITH PREGNANCY  . DUB (dysfunctional uterine bleeding)   . Family history of adverse reaction to anesthesia    MOM-HAS HARD TIME WAKING UP  . Kidney stones 2011, 2012  . Kidney stones     Patient Active Problem List   Diagnosis Date Noted  . Recurrent nephrolithiasis 03/24/2015  . Anxiety 03/21/2013  . DUB (dysfunctional uterine bleeding) 01/07/2013    Past Surgical History:  Procedure Laterality Date  . CYSTOSCOPY W/ URETERAL STENT PLACEMENT Left 04/22/2015   Procedure: CYSTOSCOPY WITH STENT REPLACEMENT;  Surgeon: Collier Flowers, MD;  Location: [facility] ORS;  Service: Urology;  Laterality: Left;  . CYSTOSCOPY/URETEROSCOPY/HOLMIUM LASER/STENT PLACEMENT Left 04/08/2015   Procedure: CYSTOSCOPY/URETEROSCOPY/HOLMIUM LASER/STENT PLACEMENT;  Surgeon: [name], MD;  Location: [facility] ORS;  Service: Urology;  Laterality: Left;  . LITHOTRIPSY    . URETEROSCOPY WITH HOLMIUM LASER LITHOTRIPSY Left 04/22/2015    Procedure: URETEROSCOPY WITH HOLMIUM LASER LITHOTRIPSY;  Surgeon: Collier Flowers, MD;  Location: [facility] ORS;  Service: Urology;  Laterality: Left;    Prior to Admission medications   Medication Sig Start Date End Date Taking? Authorizing Provider  acetaminophen (TYLENOL) 325 MG suppository Place 325 mg rectally every 4 (four) hours as needed.    [provider]  albuterol (PROVENTIL HFA;VENTOLIN HFA) 108 (90 BASE) MCG/ACT inhaler Inhale 2 puffs into the lungs every 6 (six) hours as needed for wheezing or shortness of breath. 05/26/15   Harvest Dark, PA-C  etodolac (LODINE) 400 MG tablet Take 1 tablet (400 mg total) by mouth 2 (two) times daily. 11/13/17   Johnn Hai, PA-C  HYDROcodone-acetaminophen (NORCO/VICODIN) 5-325 MG tablet Take 1 tablet by mouth at bedtime as needed for moderate pain. 11/13/17   Johnn Hai, PA-C  oxybutynin (DITROPAN) 5 MG tablet Take 1 tablet (5 mg total) by mouth every 8 (eight) hours as needed for bladder spasms. 04/08/15   [name], MD  tamsulosin (FLOMAX) 0.4 MG CAPS capsule Take 1 capsule (0.4 mg total) by mouth daily. 04/08/15   [name], MD    Allergies Patient has no known allergies.  Family History  Problem Relation Age of Onset  . Cancer Mother        breast  . Cancer Maternal Grandmother        breast, colon  . Diabetes Paternal Grandmother     Social History Social History   Tobacco Use  .  Smoking status: Current Every Day Smoker    Packs/day: 1.00    Years: 15.00    Pack years: 15.00    Types: Cigarettes  . Smokeless tobacco: Never Used  Substance Use Topics  . Alcohol use: Yes    Comment: RARE  . Drug use: Yes    Types: Marijuana    Comment: patient no longer smoking    Review of Systems Constitutional: No fever/chills Cardiovascular: Denies chest pain. Respiratory: Denies shortness of breath. Gastrointestinal: No abdominal pain.  No nausea, no vomiting.  Musculoskeletal: Positive right  shoulder pain. Skin: Negative for rash. Neurological: Negative for headaches, focal weakness or numbness. ____________________________________________   PHYSICAL EXAM:  VITAL SIGNS: ED Triage Vitals  Enc Vitals Group     BP 11/13/17 1057 (!) 143/101     Pulse Rate 11/13/17 1057 82     Resp 11/13/17 1057 17     Temp 11/13/17 1057 98 F (36.7 C)     Temp Source 11/13/17 1057 Oral     SpO2 11/13/17 1057 97 %     Weight 11/13/17 1058 160 lb (72.6 kg)     Height 11/13/17 1058 5\' 4"  (1.626 m)     Head Circumference --      Peak Flow --      Pain Score 11/13/17 1058 10     Pain Loc --      Pain Edu? --      Excl. in [place]? --    Constitutional: Alert and oriented. Well appearing and in no acute distress. Eyes: Conjunctivae are normal.  Head: Atraumatic. Nose: No congestion/rhinnorhea. Neck: No stridor.   Cardiovascular: Normal rate, regular rhythm. Grossly normal heart sounds.  Good peripheral circulation. Respiratory: Normal respiratory effort.  No retractions. Lungs CTAB. Musculoskeletal:  On examination of left shoulder there is no gross deformity however there is point tenderness on palpation in the [facility] joint area and proximal deltoid region.  There is no soft tissue swelling or abrasions.  Minimal crepitus is noted.  No skin discoloration is noted.  Motor sensory function intact distal to her pain.  Abduction against resistance increases patient's pain and this movement is limited. Neurologic:  Normal speech and language. No gross focal neurologic deficits are appreciated.  Skin:  Skin is warm, dry and intact. No rash noted. Psychiatric: Mood and affect are normal. Speech and behavior are normal.  ____________________________________________   LABS (all labs ordered are listed, but only abnormal results are displayed)  Labs Reviewed - No data to display  RADIOLOGY  ED MD interpretation:   Right shoulder x-ray is negative for any acute bony injury.  Official radiology  report(s): Dg Shoulder Right  Result Date: 11/13/2017 CLINICAL DATA:  Acute onset right shoulder pain after waking up this morning. No injury. EXAM: RIGHT SHOULDER - 2+ VIEW COMPARISON:  None. FINDINGS: No acute fracture or dislocation. Mild acromioclavicular osteoarthritis. The glenohumeral joint space is preserved. Bone mineralization is normal. Soft tissues are unremarkable. IMPRESSION: Negative. Electronically Signed   By: Titus Dubin M.D.   On: 11/13/2017 12:43    ____________________________________________   PROCEDURES  Procedure(s) performed: None  Procedures  Critical Care performed: No  ____________________________________________   INITIAL IMPRESSION / ASSESSMENT AND PLAN / ED COURSE She has follow-up with Dr. Mack Guise who is the orthopedist on call today.  It is suspicious that patient has a rotator cuff tear from her weightlifting.  Patient was given a prescription for etodolac 400 mg twice daily with food.  She may  use ice or heat to her arm as needed for discomfort.  She was also given a note limiting her lifting and activity while at work.  She is encouraged to call and make an appointment for further evaluation.  ____________________________________________   FINAL CLINICAL IMPRESSION(S) / ED DIAGNOSES  Final diagnoses:  Acute pain of right shoulder     ED Discharge Orders        Ordered    etodolac (LODINE) 400 MG tablet  2 times daily     11/13/17 1324    HYDROcodone-acetaminophen (NORCO/VICODIN) 5-325 MG tablet  At bedtime PRN     11/13/17 1324       Note:  This document was prepared using Dragon voice recognition software and may include unintentional dictation errors.    Johnn Hai, PA-C 11/13/17 1609    Schuyler Amor, MD 11/14/17 [phone]

## 2017-11-13 NOTE — ED Triage Notes (Signed)
Pt c/o waking with right shoulder pain,,states she has had injuries while sleeping in the past. Denies any other injures at present.

## 2018-04-11 ENCOUNTER — Encounter: Payer: Self-pay | Admitting: Family Medicine

## 2018-04-11 ENCOUNTER — Ambulatory Visit (INDEPENDENT_AMBULATORY_CARE_PROVIDER_SITE_OTHER): Payer: BLUE CROSS/BLUE SHIELD | Admitting: Family Medicine

## 2018-04-11 VITALS — BP 146/90 | HR 78 | Temp 98.2°F | Ht 64.0 in | Wt 157.2 lb

## 2018-04-11 DIAGNOSIS — Z3009 Encounter for other general counseling and advice on contraception: Secondary | ICD-10-CM | POA: Diagnosis not present

## 2018-04-11 DIAGNOSIS — F17200 Nicotine dependence, unspecified, uncomplicated: Secondary | ICD-10-CM | POA: Diagnosis not present

## 2018-04-11 DIAGNOSIS — N200 Calculus of kidney: Secondary | ICD-10-CM

## 2018-04-11 DIAGNOSIS — Z1239 Encounter for other screening for malignant neoplasm of breast: Secondary | ICD-10-CM

## 2018-04-11 DIAGNOSIS — Z803 Family history of malignant neoplasm of breast: Secondary | ICD-10-CM

## 2018-04-11 DIAGNOSIS — R030 Elevated blood-pressure reading, without diagnosis of hypertension: Secondary | ICD-10-CM | POA: Insufficient documentation

## 2018-04-11 DIAGNOSIS — Z1231 Encounter for screening mammogram for malignant neoplasm of breast: Secondary | ICD-10-CM

## 2018-04-11 MED ORDER — BUPROPION HCL 75 MG PO TABS: 150.0000 mg | ORAL_TABLET | Freq: Two times a day (BID) | ORAL | 3 refills | Status: DC

## 2018-04-11 NOTE — Assessment & Plan Note (Signed)
History of recurrent nephrolithiasis Previously with multiple interventions by urology Not currently followed by urology Currently asymptomatic We will continue to monitor

## 2018-04-11 NOTE — Patient Instructions (Signed)
Start bupropion 150mg  daily for 3 days and then increase to twice daily Don't take close to bedtime   Health Risks of Smoking Smoking cigarettes is very bad for your health. Tobacco smoke has over 200 known poisons in it. It contains the poisonous gases nitrogen oxide and carbon monoxide. There are over 60 chemicals in tobacco smoke that cause cancer. Smoking is difficult to quit because a chemical in tobacco, called nicotine, causes addiction or dependence. When you smoke and inhale, nicotine is absorbed rapidly into the bloodstream through your lungs. Both inhaled and non-inhaled nicotine may be addictive. What are the risks of cigarette smoke? Cigarette smokers have an increased risk of many serious medical problems, including:  Lung cancer.  Lung disease, such as pneumonia, bronchitis, and emphysema.  Chest pain (angina) and heart attack because the heart is not getting enough oxygen.  Heart disease and peripheral blood vessel disease.  High blood pressure (hypertension).  Stroke.  Oral cancer, including cancer of the lip, mouth, or voice box.  Bladder cancer.  Pancreatic cancer.  Cervical cancer.  Pregnancy complications, including premature birth.  Stillbirths and smaller newborn babies, birth defects, and genetic damage to sperm.  Early menopause.  Lower estrogen level for women.  Infertility.  Facial wrinkles.  Blindness.  Increased risk of broken bones (fractures).  Senile dementia.  Stomach ulcers and internal bleeding.  Delayed wound healing and increased risk of complications during surgery.  Even smoking lightly shortens your life expectancy by several years.  Because of secondhand smoke exposure, children of smokers have an increased risk of the following:  Sudden infant death syndrome (SIDS).  Respiratory infections.  Lung cancer.  Heart disease.  Ear infections.  What are the benefits of quitting? There are many health benefits of  quitting smoking. Here are some of them:  Within days of quitting smoking, your risk of having a heart attack decreases, your blood flow improves, and your lung capacity improves. Blood pressure, pulse rate, and breathing patterns start returning to normal soon after quitting.  Within months, your lungs may clear up completely.  Quitting for 10 years reduces your risk of developing lung cancer and heart disease to almost that of a nonsmoker.  People who quit may see an improvement in their overall quality of life.  How do I quit smoking? Smoking is an addiction with both physical and psychological effects, and longtime habits can be hard to change. Your health care provider can recommend:  Programs and community resources, which may include group support, education, or talk therapy.  Prescription medicines to help reduce cravings.  Nicotine replacement products, such as patches, gum, and nasal sprays. Use these products only as directed. Do not replace cigarette smoking with electronic cigarettes, which are commonly called e-cigarettes. The safety of e-cigarettes is not known, and some may contain harmful chemicals.  A combination of two or more of these methods.  Where to find more information:  American Lung Association: www.lung.org  American Cancer Society: www.cancer.org Summary  Smoking cigarettes is very bad for your health. Cigarette smokers have an increased risk of many serious medical problems, including several cancers, heart disease, and stroke.  Smoking is an addiction with both physical and psychological effects, and longtime habits can be hard to change.  By stopping right away, you can greatly reduce the risk of medical problems for you and your family.  To help you quit smoking, your health care provider can recommend programs, community resources, prescription medicines, and nicotine replacement products such  as patches, gum, and nasal sprays. This information is  not intended to replace advice given to you by your health care provider. Make sure you discuss any questions you have with your health care provider. Document Released: 09/22/2004 Document Revised: 08/19/2016 Document Reviewed: 08/19/2016 Elsevier Interactive Patient Education  2017 [employer].

## 2018-04-11 NOTE — Assessment & Plan Note (Signed)
We will get screening mammogram

## 2018-04-11 NOTE — Assessment & Plan Note (Signed)
Blood pressure is elevated today This could be related to nerves given that this is her first visit She has never been diagnosed with hypertension before She has never taken an antihypertensive We will recheck at upcoming appointment within the next month If remains elevated, we will consider starting a medication versus lifestyle interventions

## 2018-04-11 NOTE — Progress Notes (Signed)
Patient: Brittany Herrera Female    DOB: [birth year]   47 y.o.   MRN: [id number] Visit Date: 04/11/2018  Today's Provider: Lavon Paganini, MD   Chief Complaint  Patient presents with  . New Patient (Initial Visit)   Subjective:    HPI Brittany Herrera is a 47 year old female who presents today to [facility] as a new patient. Patient states she was a patient at Urgent Care on [address] approximately 5-6 years ago. Patient states she feels well. She is requesting health maintenance and referral for mammogram.    H/o multiple kidney stones: Previously followed by Urology.  S/p multiple lithotripsies and stents.  Last occurrence was in 2016.  She is feeling well currently.  Has family h/o breast cancer: Mother at age ~43. Treatment with lumpectomy, no chemo/radiation and MGM around age 67.  Treated with mastectomy.  Patient has never had a mammogram.  She is concerned and would like to start getting screened for breast cancer.  She has not noticed any lumps or things that she is concerned about.  Tobacco abuse: Smoking 1 PPD x23 yrs.  Has tried to quit in the past with cold [place] and gum.  Didn't work.  Finds that she uses this for stress relief.  She is interested in quitting at this time.  She has never tried Wellbutrin or Chantix.  She does have nightmares frequently already.  She also states that she has a lot of "stress ", but denies depression.  Patient reports that she is sexually active with one female partner.  She is listed as postmenopausal in her chart, but she is still having periods.  These are very light and almost like spotting and occur only every 3 to 4 months.  She was previously on OCPs in her 59s.  She does worry about pregnancy and would like to be on a birth control method.     No Known Allergies   Current Outpatient Medications:  .  buPROPion (WELLBUTRIN) 75 MG tablet, Take 2 tablets (150 mg total) by mouth 2 (two) times daily., Disp: 120 tablet, Rfl:  3  Review of Systems  Constitutional: Negative.   HENT: Positive for dental problem.   Eyes: Negative.   Respiratory: Negative.   Cardiovascular: Positive for palpitations.  Gastrointestinal: Negative.   Endocrine: Negative.   Genitourinary: Negative.   Musculoskeletal: Negative.   Skin: Negative.   Allergic/Immunologic: Negative.   Neurological: Negative.   Hematological: Negative.   Psychiatric/Behavioral: The patient is nervous/anxious.     Social History   Tobacco Use  . Smoking status: Current Every Day Smoker    Packs/day: 1.00    Years: 23.00    Pack years: 23.00    Types: Cigarettes  . Smokeless tobacco: Never Used  Substance Use Topics  . Alcohol use: Yes    Alcohol/week: 4.0 standard drinks    Types: 4 Shots of liquor per week   Objective:   BP (!) 146/90 (BP Location: Right Arm, Patient Position: Sitting, Cuff Size: Normal)   Pulse 78   Temp 98.2 F (36.8 C) (Oral)   Ht 5\' 4"  (1.626 m)   Wt 157 lb 3.2 oz (71.3 kg)   LMP 05/26/2015 (Exact Date)   SpO2 97%   BMI 26.98 kg/m  Vitals:   04/11/18 1521  BP: (!) 146/90  Pulse: 78  Temp: 98.2 F (36.8 C)  TempSrc: Oral  SpO2: 97%  Weight: 157 lb 3.2 oz (71.3 kg)  Height: 5'  4" (1.626 m)     Physical Exam  Constitutional: She is oriented to person, place, and time. She appears well-developed and well-nourished. No distress.  HENT:  Head: Normocephalic and atraumatic.  Right Ear: External ear normal.  Left Ear: External ear normal.  Nose: Nose normal.  Mouth/Throat: Oropharynx is clear and moist.  Eyes: Pupils are equal, round, and reactive to light. Conjunctivae and EOM are normal. No scleral icterus.  Neck: Neck supple. No thyromegaly present.  Cardiovascular: Normal rate, regular rhythm, normal heart sounds and intact distal pulses.  No murmur heard. Pulmonary/Chest: Effort normal and breath sounds normal. No respiratory distress. She has no wheezes. She has no rales.  Abdominal: Soft. Bowel  sounds are normal. She exhibits no distension. There is no tenderness. There is no rebound and no guarding.  Musculoskeletal: She exhibits no edema or deformity.  Lymphadenopathy:    She has no cervical adenopathy.  Neurological: She is alert and oriented to person, place, and time.  Skin: Skin is warm and dry. Capillary refill takes less than 2 seconds. No rash noted.  Psychiatric: She has a normal mood and affect. Her behavior is normal.  Vitals reviewed.       Assessment & Plan:   Problem List Items Addressed This Visit      Genitourinary   Recurrent nephrolithiasis    History of recurrent nephrolithiasis Previously with multiple interventions by urology Not currently followed by urology Currently asymptomatic We will continue to monitor        Other   Elevated BP without diagnosis of hypertension    Blood pressure is elevated today This could be related to nerves given that this is her first visit She has never been diagnosed with hypertension before She has never taken an antihypertensive We will recheck at upcoming appointment within the next month If remains elevated, we will consider starting a medication versus lifestyle interventions      Encounter for counseling regarding contraception    Discussed options for contraceptives As she is a smoker and over age 64, is not appropriate for any estrogen therapy given increased risk of blood clots and stroke Patient believes she would like a Nexplanon More reading given on this She will return for Nexplanon placement We will do a point-of-care pregnancy test when she comes for Nexplanon placement      Tobacco use disorder - Primary    Discussed with patient the health risks of tobacco use, including cancer risk, lung disease, heart disease, peripheral artery disease etc. She is interested in trying to quit smoking Discussed nicotine replacement, Chantix, Wellbutrin As she already has issues with nightmares, Chantix  is not optimal She is tried nicotine replacement therapy in the past without success She agrees to try Wellbutrin We will start with 150 mg daily x3 days and then increase to 150 mg twice daily Discussed that she will stay on this at least 3 months after quitting Discussed that she should pick a quit date to be at least 1 week after starting medication Discussed possible side effects We will follow-up at next visit to see how her quitting efforts are going      Family history of breast cancer    We will get screening mammogram      Relevant Orders   MM 3D SCREEN BREAST BILATERAL    Other Visit Diagnoses    Screening for breast cancer       Relevant Orders   MM 3D SCREEN BREAST BILATERAL  Return in about 4 weeks (around 05/09/2018) for CPE.   The entirety of the information documented in the History of Present Illness, Review of Systems and Physical Exam were personally obtained by me. Portions of this information were initially documented by [name], CMA and reviewed by me for thoroughness and accuracy.    [name], MD, MPH [facility] 04/11/2018 5:04 PM

## 2018-04-11 NOTE — Assessment & Plan Note (Signed)
Discussed with patient the health risks of tobacco use, including cancer risk, lung disease, heart disease, peripheral artery disease etc. She is interested in trying to quit smoking Discussed nicotine replacement, Chantix, Wellbutrin As she already has issues with nightmares, Chantix is not optimal She is tried nicotine replacement therapy in the past without success She agrees to try Wellbutrin We will start with 150 mg daily x3 days and then increase to 150 mg twice daily Discussed that she will stay on this at least 3 months after quitting Discussed that she should pick a quit date to be at least 1 week after starting medication Discussed possible side effects We will follow-up at next visit to see how her quitting efforts are going

## 2018-04-11 NOTE — Assessment & Plan Note (Signed)
Discussed options for contraceptives As she is a smoker and over age 45, is not appropriate for any estrogen therapy given increased risk of blood clots and stroke Patient believes she would like a Nexplanon More reading given on this She will return for Nexplanon placement We will do a point-of-care pregnancy test when she comes for Nexplanon placement

## 2018-04-16 ENCOUNTER — Ambulatory Visit (INDEPENDENT_AMBULATORY_CARE_PROVIDER_SITE_OTHER): Payer: BLUE CROSS/BLUE SHIELD | Admitting: Family Medicine

## 2018-04-16 ENCOUNTER — Encounter: Payer: Self-pay | Admitting: Family Medicine

## 2018-04-16 VITALS — BP 132/78 | HR 78 | Temp 98.0°F | Wt 156.0 lb

## 2018-04-16 DIAGNOSIS — Z30017 Encounter for initial prescription of implantable subdermal contraceptive: Secondary | ICD-10-CM

## 2018-04-16 LAB — POCT URINE PREGNANCY: Preg Test, Ur: NEGATIVE

## 2018-04-16 MED ORDER — ETONOGESTREL 68 MG ~~LOC~~ IMPL
68.0000 mg | DRUG_IMPLANT | Freq: Once | SUBCUTANEOUS | Status: AC
  Administered 2018-04-16: 68 mg via SUBCUTANEOUS

## 2018-04-16 NOTE — Progress Notes (Signed)
     Patient: Brittany Herrera Female    DOB: 08/18/1971   47 y.o.   MRN: [id number] Visit Date: 04/16/2018  Today's Provider: Lavon Paganini, MD   Chief Complaint  Patient presents with  . Nexplanon Placement   PROCEDURE NOTE: NEXPLANON  INSERTION   [name] is a 47 y.o. year old Caucasian female here for Nexplanon insertion.  Her LMP was unknown (irregular), and her pregnancy test today was negative.  Risks/benefits/side effects of Nexplanon have been discussed and her questions have been answered.  Specifically, a failure rate of 08/998 has been reported, with an increased failure rate if pt takes Tustin and/or antiseizure medicaitons.  [name] is aware of the common side effect of irregular bleeding, which the incidence of decreases over time.  Her left arm, approximatly 4 inches proximal from the elbow, was cleansed with alcohol and anesthetized with 4cc of 2% Lidocaine.  The area was cleansed again and the Nexplanon was inserted without difficulty.  A pressure bandage was applied.  Pt was instructed to remove pressure bandage in a few hours, and keep insertion site covered with a bandaid for 3 days.  Back up contraception was recommended for 7 days.  Follow-up scheduled PRN problems  Results for orders placed or performed in visit on 04/16/18  POCT urine pregnancy  Result Value Ref Range   Preg Test, Ur Negative Negative     Bacigalupo, Dionne Bucy, MD, MPH [facility] 04/16/2018 11:58 AM

## 2018-04-16 NOTE — Patient Instructions (Signed)
Nexplanon Instructions After Insertion  Keep bandage clean and dry for 24 hours  May use ice/Tylenol/Ibuprofen for soreness or pain  If you develop fever, drainage or increased warmth from incision site-contact office immediately   

## 2018-05-14 ENCOUNTER — Encounter: Payer: BLUE CROSS/BLUE SHIELD | Admitting: Family Medicine

## 2018-05-14 NOTE — Progress Notes (Deleted)
Patient: Brittany Herrera, Female    DOB: May 06, 1971, 47 y.o.   MRN: [id number] Visit Date: 05/14/2018  Today's Provider: Lavon Paganini, MD   No chief complaint on file.  Subjective:  I, Brittany Herrera, CMA, am acting as a scribe for Lavon Paganini, MD.    Annual physical exam Brittany Herrera is a 47 y.o. female who presents today for health maintenance and complete physical. She feels {DESC; WELL/FAIRLY WELL/POORLY:18703}. She reports exercising ***. She reports she is sleeping {DESC; WELL/FAIRLY WELL/POORLY:18703}.  -----------------------------------------------------------------   Review of Systems  Constitutional: Negative.   HENT: Negative.   Eyes: Negative.   Respiratory: Negative.   Cardiovascular: Negative.   Gastrointestinal: Negative.   Endocrine: Negative.   Genitourinary: Negative.   Musculoskeletal: Negative.   Skin: Negative.   Allergic/Immunologic: Negative.   Neurological: Negative.   Hematological: Negative.   Psychiatric/Behavioral: Negative.     Social History      She  reports that she has been smoking cigarettes. She has a 23.00 pack-year smoking history. She has never used smokeless tobacco. She reports that she drinks about 4.0 standard drinks of alcohol per week. She reports that she has current or past drug history. Drug: Marijuana.       Social History   Socioeconomic History  . Marital status: Legally Separated    Spouse name: Not on file  . Number of children: 1  . Years of education: Not on file  . Highest education level: Not on file  Occupational History    Employer: [employer]  Social Needs  . Financial resource strain: Not on file  . Food insecurity:    Worry: Not on file    Inability: Not on file  . Transportation needs:    Medical: Not on file    Non-medical: Not on file  Tobacco Use  . Smoking status: Current Every Day Smoker    Packs/day: 1.00    Years: 23.00    Pack years: 23.00    Types:  Cigarettes  . Smokeless tobacco: Never Used  Substance and Sexual Activity  . Alcohol use: Yes    Alcohol/week: 4.0 standard drinks    Types: 4 Shots of liquor per week  . Drug use: Not Currently    Types: Marijuana    Comment: patient no longer smoking  . Sexual activity: Yes    Partners: Male    Birth control/protection: None  Lifestyle  . Physical activity:    Days per week: Not on file    Minutes per session: Not on file  . Stress: Not on file  Relationships  . Social connections:    Talks on phone: Not on file    Gets together: Not on file    Attends religious service: Not on file    Active member of club or organization: Not on file    Attends meetings of clubs or organizations: Not on file    Relationship status: Not on file  Other Topics Concern  . Not on file  Social History Narrative  . Not on file    Past Medical History:  Diagnosis Date  . Abnormal Pap smear 2001   [facility]- told her that she had polyps didnt get tx  . Anemia    ONLY WITH PREGNANCY  . DUB (dysfunctional uterine bleeding)    menorrhagia, and bleeding continuously x3 yrs  . Family history of adverse reaction to anesthesia    MOM-HAS HARD TIME WAKING UP  .  Kidney stones 2011, 2012  . Staghorn kidney stones      Patient Active Problem List   Diagnosis Date Noted  . Elevated BP without diagnosis of hypertension 04/11/2018  . Encounter for counseling regarding contraception 04/11/2018  . Tobacco use disorder 04/11/2018  . Family history of breast cancer 04/11/2018  . Recurrent nephrolithiasis 03/24/2015  . Anxiety 03/21/2013    Past Surgical History:  Procedure Laterality Date  . CYSTOSCOPY W/ URETERAL STENT PLACEMENT Left 04/22/2015   Procedure: CYSTOSCOPY WITH STENT REPLACEMENT;  Surgeon: Collier Flowers, MD;  Location: [facility];  Service: Urology;  Laterality: Left;  . CYSTOSCOPY/URETEROSCOPY/HOLMIUM LASER/STENT PLACEMENT Left 04/08/2015   Procedure:  CYSTOSCOPY/URETEROSCOPY/HOLMIUM LASER/STENT PLACEMENT;  Surgeon: [name], MD;  Location: [facility];  Service: Urology;  Laterality: Left;  . LITHOTRIPSY    . URETEROSCOPY WITH HOLMIUM LASER LITHOTRIPSY Left 04/22/2015   Procedure: URETEROSCOPY WITH HOLMIUM LASER LITHOTRIPSY;  Surgeon: Collier Flowers, MD;  Location: [facility];  Service: Urology;  Laterality: Left;    Family History        Family Status  Relation Name Status  . Mother  Alive  . Father  Alive  . MGM  Deceased  . PGM  Deceased  . Sister  Alive  . Daughter  Alive  . MGF  Deceased  . PGF  Deceased  . Sister  Alive        Her family history includes Anemia in her daughter, maternal grandmother, sister, and sister; Arthritis in her mother; Asthma in her mother; COPD in her paternal grandfather; Cancer (age of onset: 54) in her mother; Cancer (age of onset: 56) in her maternal grandmother; Diabetes in her paternal grandmother; Heart disease in her maternal grandfather; Stroke in her paternal grandmother; Urinary tract infection in her sister and sister.      No Known Allergies   Current Outpatient Medications:  .  buPROPion (WELLBUTRIN) 75 MG tablet, Take 2 tablets (150 mg total) by mouth 2 (two) times daily., Disp: 120 tablet, Rfl: 3   Patient Care Team: [name], MD as PCP - General (Family Medicine)      Objective:   Vitals: LMP 05/26/2015 (Exact Date)   There were no vitals filed for this visit.   Physical Exam   Depression Screen PHQ 2/9 Scores 04/11/2018  PHQ - 2 Score 2  PHQ- 9 Score 10      Assessment & Plan:     Routine Health Maintenance and Physical Exam  Exercise Activities and Dietary recommendations Goals   None     Immunization History  Administered Date(s) Administered  . Tdap 09/24/2017    Health Maintenance  Topic Date Due  . HIV Screening  12/15/1987  . PAP SMEAR  01/08/2016  . INFLUENZA VACCINE  03/29/2018  . TETANUS/TDAP  09/25/2027     Discussed  health benefits of physical activity, and encouraged her to engage in regular exercise appropriate for her age and condition.    --------------------------------------------------------------------    Lavon Paganini, MD  [place]

## 2018-07-13 ENCOUNTER — Encounter: Payer: Self-pay | Admitting: Family Medicine

## 2018-07-13 ENCOUNTER — Ambulatory Visit: Payer: BLUE CROSS/BLUE SHIELD | Admitting: Family Medicine

## 2018-07-13 VITALS — BP 141/87 | HR 65 | Temp 98.4°F | Wt 158.0 lb

## 2018-07-13 DIAGNOSIS — N921 Excessive and frequent menstruation with irregular cycle: Secondary | ICD-10-CM

## 2018-07-13 DIAGNOSIS — Z23 Encounter for immunization: Secondary | ICD-10-CM

## 2018-07-13 DIAGNOSIS — Z3009 Encounter for other general counseling and advice on contraception: Secondary | ICD-10-CM

## 2018-07-13 DIAGNOSIS — F17200 Nicotine dependence, unspecified, uncomplicated: Secondary | ICD-10-CM | POA: Diagnosis not present

## 2018-07-13 DIAGNOSIS — N920 Excessive and frequent menstruation with regular cycle: Secondary | ICD-10-CM | POA: Insufficient documentation

## 2018-07-13 NOTE — Assessment & Plan Note (Signed)
Ongoing chronci issue Has not improved with Nexplanon Discussed that irregular bleeding is common with Nexplanon Given options to wait a few more months before intervention, trying 1 month of OCPs to reset endometrium, and use of other contraceptive Patient would like to return for Nexplanon removal and IUD insertion Patient desires Mirena IUD for better bleeding profile Will also check CBC and TSH

## 2018-07-13 NOTE — Assessment & Plan Note (Signed)
As above, will remove Nexplanon and place Mirena at upcoming visit

## 2018-07-13 NOTE — Assessment & Plan Note (Signed)
Congratulated patient on decreased smoking Encouraged full cessation Continue Wellbutrin at current dose

## 2018-07-13 NOTE — Progress Notes (Signed)
Patient: Brittany Herrera Female    DOB: [birth year]   47 y.o.   MRN: [id number] Visit Date: 07/13/2018  Today's Provider: Lavon Paganini, MD   Chief Complaint  Patient presents with  . Menstrual Problem   Subjective:    I, [name], CMA, am acting as a scribe for Lavon Paganini, MD.   HPI Patient presents today C/O irregular menstrual cycles. She reports her cycle has been ongoing since approximately 05/07/2018. Patient had a Nexplanon placed on 04/16/2018. Patient reports fatigue, and experiencing some blood clotting. She denies cramping and abdominal pain.  She had menorrhagia with irregular cycle prior to Nexplanon placement and it has not changed since Nexplanon placement.  Bleeding fluctuates with spotting.  Patient is also proud to report that she has cut down significantly on smoking.  She only smokes a cigarette q4-5 days.  She is only smoking when triggered by stress.  She feels like Wellbutrin is really helping    No Known Allergies   Current Outpatient Medications:  .  buPROPion (WELLBUTRIN) 75 MG tablet, Take 2 tablets (150 mg total) by mouth 2 (two) times daily., Disp: 120 tablet, Rfl: 3  Review of Systems  Constitutional: Negative.   Respiratory: Negative.   Cardiovascular: Negative.   Genitourinary: Positive for vaginal bleeding.  Musculoskeletal: Negative.     Social History   Tobacco Use  . Smoking status: Current Every Day Smoker    Packs/day: 1.00    Years: 23.00    Pack years: 23.00    Types: Cigarettes  . Smokeless tobacco: Never Used  Substance Use Topics  . Alcohol use: Yes    Alcohol/week: 4.0 standard drinks    Types: 4 Shots of liquor per week   Objective:   BP (!) 141/87 (BP Location: Right Arm, Patient Position: Sitting, Cuff Size: Normal)   Pulse 65   Temp 98.4 F (36.9 C) (Oral)   Wt 158 lb (71.7 kg)   LMP  (Exact Date)   SpO2 97%   BMI 27.12 kg/m  Vitals:   07/13/18 1054  BP: (!) 141/87  Pulse: 65  Temp: 98.4 F  (36.9 C)  TempSrc: Oral  SpO2: 97%  Weight: 158 lb (71.7 kg)     Physical Exam  Constitutional: She is oriented to person, place, and time. She appears well-developed and well-nourished. No distress.  HENT:  Head: Normocephalic and atraumatic.  Eyes: Conjunctivae are normal. No scleral icterus.  Neck: Neck supple. No thyromegaly present.  Cardiovascular: Normal rate, regular rhythm, normal heart sounds and intact distal pulses.  No murmur heard. Pulmonary/Chest: Effort normal and breath sounds normal. No respiratory distress. She has no wheezes. She has no rales.  Abdominal: Soft. She exhibits no distension. There is no tenderness.  Musculoskeletal: She exhibits no edema.  Lymphadenopathy:    She has no cervical adenopathy.  Neurological: She is alert and oriented to person, place, and time.  Skin: Skin is warm and dry. Capillary refill takes less than 2 seconds. No rash noted. No pallor.  Psychiatric: She has a normal mood and affect. Her behavior is normal.  Vitals reviewed.       Assessment & Plan:   Problem List Items Addressed This Visit      Other   Encounter for counseling regarding contraception    As above, will remove Nexplanon and place Mirena at upcoming visit      Tobacco use disorder    Congratulated patient on decreased smoking Encouraged full cessation Continue  Wellbutrin at current dose      Menorrhagia - Primary    Ongoing chronci issue Has not improved with Nexplanon Discussed that irregular bleeding is common with Nexplanon Given options to wait a few more months before intervention, trying 1 month of OCPs to reset endometrium, and use of other contraceptive Patient would like to return for Nexplanon removal and IUD insertion Patient desires Mirena IUD for better bleeding profile Will also check CBC and TSH      Relevant Orders   CBC w/Diff/Platelet   TSH       Return in about 2 weeks (around 07/27/2018) for 12/4 morning for IUD  placement and Nexplanon removal.   The entirety of the information documented in the History of Present Illness, Review of Systems and Physical Exam were personally obtained by me. Portions of this information were initially documented by [name], CMA and reviewed by me for thoroughness and accuracy.    [name], MD, MPH [facility] 07/13/2018 2:07 PM

## 2018-07-14 LAB — TSH: TSH: 1.58 u[IU]/mL (ref 0.450–4.500)

## 2018-07-14 LAB — CBC WITH DIFFERENTIAL/PLATELET
Basophils Absolute: 0.1 10*3/uL (ref 0.0–0.2)
Basos: 1 %
EOS (ABSOLUTE): 0.4 10*3/uL (ref 0.0–0.4)
Eos: 6 %
HEMATOCRIT: 41.9 % (ref 34.0–46.6)
Hemoglobin: 14.9 g/dL (ref 11.1–15.9)
IMMATURE GRANS (ABS): 0 10*3/uL (ref 0.0–0.1)
Immature Granulocytes: 0 %
LYMPHS: 29 %
Lymphocytes Absolute: 1.9 10*3/uL (ref 0.7–3.1)
MCH: 35 pg — AB (ref 26.6–33.0)
MCHC: 35.6 g/dL (ref 31.5–35.7)
MCV: 98 fL — ABNORMAL HIGH (ref 79–97)
Monocytes Absolute: 0.5 10*3/uL (ref 0.1–0.9)
Monocytes: 7 %
NEUTROS ABS: 3.7 10*3/uL (ref 1.4–7.0)
Neutrophils: 57 %
PLATELETS: 234 10*3/uL (ref 150–450)
RBC: 4.26 x10E6/uL (ref 3.77–5.28)
RDW: 11.7 % — ABNORMAL LOW (ref 12.3–15.4)
WBC: 6.6 10*3/uL (ref 3.4–10.8)

## 2018-07-16 ENCOUNTER — Telehealth: Payer: Self-pay | Admitting: *Deleted

## 2018-07-16 ENCOUNTER — Telehealth: Payer: Self-pay | Admitting: Family Medicine

## 2018-07-16 MED ORDER — MEDROXYPROGESTERONE ACETATE 10 MG PO TABS: 10.0000 mg | ORAL_TABLET | Freq: Every day | ORAL | 0 refills | Status: DC

## 2018-07-16 NOTE — Telephone Encounter (Signed)
Provera 10mg  daily x10d sent to pharmacy.  This is the pill that we discussed that should stop her bleeding and reset.  If she does not want the IUD anymore, she can cancel the appointment in December.  If this does not work, we could consider 1 month of birth control pill, but would like to try to avoid estrogen as we discussed  Bacigalupo, Dionne Bucy, MD, MPH [facility] 07/16/2018 2:07 PM

## 2018-07-16 NOTE — Telephone Encounter (Signed)
Attempted to contact patient. No answer and voicemail is not set up. There is another telephone message regarding lab results also.

## 2018-07-16 NOTE — Telephone Encounter (Signed)
-----   Message from [name], MD sent at 07/16/2018 10:33 AM EST ----- Normal labs  Bacigalupo, Dionne Bucy, MD, MPH [facility] 07/16/2018 10:32 AM

## 2018-07-16 NOTE — Telephone Encounter (Signed)
Pt wanting to know to let Dr. B know she wants to go with the pills they had discussed to help with rebooting her mensteral symptoms. Also wanting to know if she still needed the appointment in Dec/  Please advise.  Thanks, [employer]

## 2018-07-16 NOTE — Telephone Encounter (Signed)
No answer and no vm

## 2018-07-18 NOTE — Telephone Encounter (Signed)
Tried to contact pt no answer and no vm.  dbs

## 2018-07-18 NOTE — Telephone Encounter (Signed)
Pt returned missed call. Please call pt back on cell phone.  She will have it with her now.  Thanks, [employer]

## 2018-07-18 NOTE — Telephone Encounter (Signed)
No answer and vm to leave message.  dbs

## 2018-07-18 NOTE — Telephone Encounter (Signed)
Patient advised. She preferred to cancel appointment for IUD placement.

## 2018-07-18 NOTE — Telephone Encounter (Signed)
Patient advised.

## 2018-08-01 ENCOUNTER — Ambulatory Visit: Payer: BLUE CROSS/BLUE SHIELD | Admitting: Family Medicine

## 2018-12-03 ENCOUNTER — Other Ambulatory Visit: Payer: Self-pay

## 2018-12-03 ENCOUNTER — Emergency Department
Admission: EM | Admit: 2018-12-03 | Discharge: 2018-12-03 | Disposition: A | Discharge status: Home / Self Care | Payer: BLUE CROSS/BLUE SHIELD | Attending: Emergency Medicine | Admitting: Emergency Medicine

## 2018-12-03 ENCOUNTER — Encounter: Payer: Self-pay | Admitting: Emergency Medicine

## 2018-12-03 DIAGNOSIS — J209 Acute bronchitis, unspecified: Secondary | ICD-10-CM | POA: Diagnosis not present

## 2018-12-03 DIAGNOSIS — F17210 Nicotine dependence, cigarettes, uncomplicated: Secondary | ICD-10-CM | POA: Insufficient documentation

## 2018-12-03 DIAGNOSIS — M545 Low back pain, unspecified: Secondary | ICD-10-CM

## 2018-12-03 DIAGNOSIS — R0981 Nasal congestion: Secondary | ICD-10-CM | POA: Diagnosis not present

## 2018-12-03 DIAGNOSIS — R05 Cough: Secondary | ICD-10-CM | POA: Diagnosis present

## 2018-12-03 LAB — URINALYSIS, COMPLETE (UACMP) WITH MICROSCOPIC
Bacteria, UA: NONE SEEN
Bilirubin Urine: NEGATIVE
Glucose, UA: NEGATIVE mg/dL
Hgb urine dipstick: NEGATIVE
Ketones, ur: NEGATIVE mg/dL
Leukocytes,Ua: NEGATIVE
Nitrite: NEGATIVE
Protein, ur: NEGATIVE mg/dL
Specific Gravity, Urine: 1.02 (ref 1.005–1.030)
pH: 6 (ref 5.0–8.0)

## 2018-12-03 LAB — POCT PREGNANCY, URINE: Preg Test, Ur: NEGATIVE

## 2018-12-03 MED ORDER — CYCLOBENZAPRINE HCL 10 MG PO TABS: 10.0000 mg | ORAL_TABLET | Freq: Three times a day (TID) | ORAL | 0 refills | Status: DC | PRN

## 2018-12-03 MED ORDER — AMOXICILLIN 500 MG PO CAPS: 500.0000 mg | ORAL_CAPSULE | Freq: Three times a day (TID) | ORAL | 0 refills | Status: DC

## 2018-12-03 MED ORDER — ALBUTEROL SULFATE HFA 108 (90 BASE) MCG/ACT IN AERS: 2.0000 | INHALATION_SPRAY | Freq: Four times a day (QID) | RESPIRATORY_TRACT | 2 refills | Status: DC | PRN

## 2018-12-03 NOTE — ED Notes (Signed)
First Nurse Note: Patient complaining of back pain and cough.  Wearing adult face mask.  NAD.

## 2018-12-03 NOTE — ED Provider Notes (Signed)
[facility] Emergency Department Provider Note  ____________________________________________   First MD Initiated Contact with Patient 12/03/18 [phone]     (approximate)  I have reviewed the triage vital signs and the nursing notes.   HISTORY  Chief Complaint Back Pain and Cough    HPI Brittany Herrera is a 48 y.o. female presents to the emergency department with URI symptoms.  Is complaining of cough, congestion, denies fever, denies chills, denies chest pain, denies shortness of breath, symptoms for 3 weeks, mucus is green Denies recent travel, denies close contact with Covid19+ patient, patient is also complaining of lower back pain.  Symptoms for about a week.  States makes her nauseated.  She is afraid she either has a kidney infection or kidney stones.  She still denies any fever.   Past Medical History:  Diagnosis Date  . Abnormal Pap smear 2001   [place] [facility]- told her that she had polyps didnt get tx  . Anemia    ONLY WITH PREGNANCY  . DUB (dysfunctional uterine bleeding)    menorrhagia, and bleeding continuously x3 yrs  . Family history of adverse reaction to anesthesia    MOM-HAS HARD TIME WAKING UP  . Kidney stones 2011, 2012  . Staghorn kidney stones     Patient Active Problem List   Diagnosis Date Noted  . Menorrhagia 07/13/2018  . Elevated BP without diagnosis of hypertension 04/11/2018  . Encounter for counseling regarding contraception 04/11/2018  . Tobacco use disorder 04/11/2018  . Family history of breast cancer 04/11/2018  . Recurrent nephrolithiasis 03/24/2015  . Anxiety 03/21/2013    Past Surgical History:  Procedure Laterality Date  . CYSTOSCOPY W/ URETERAL STENT PLACEMENT Left 04/22/2015   Procedure: CYSTOSCOPY WITH STENT REPLACEMENT;  Surgeon: Collier Flowers, MD;  Location: [facility] ORS;  Service: Urology;  Laterality: Left;  . CYSTOSCOPY/URETEROSCOPY/HOLMIUM LASER/STENT PLACEMENT Left 04/08/2015   Procedure:  CYSTOSCOPY/URETEROSCOPY/HOLMIUM LASER/STENT PLACEMENT;  Surgeon: [name], MD;  Location: [facility] ORS;  Service: Urology;  Laterality: Left;  . LITHOTRIPSY    . URETEROSCOPY WITH HOLMIUM LASER LITHOTRIPSY Left 04/22/2015   Procedure: URETEROSCOPY WITH HOLMIUM LASER LITHOTRIPSY;  Surgeon: Collier Flowers, MD;  Location: [facility] ORS;  Service: Urology;  Laterality: Left;    Prior to Admission medications   Medication Sig Start Date End Date Taking? Authorizing Provider  albuterol (PROVENTIL HFA;VENTOLIN HFA) 108 (90 Base) MCG/ACT inhaler Inhale 2 puffs into the lungs every 6 (six) hours as needed for wheezing or shortness of breath. 12/03/18   Fisher, Linden Dolin, PA-C  amoxicillin (AMOXIL) 500 MG capsule Take 1 capsule (500 mg total) by mouth 3 (three) times daily. 12/03/18   Fisher, Linden Dolin, PA-C  buPROPion (WELLBUTRIN) 75 MG tablet Take 2 tablets (150 mg total) by mouth 2 (two) times daily. 04/11/18   [name], MD  cyclobenzaprine (FLEXERIL) 10 MG tablet Take 1 tablet (10 mg total) by mouth 3 (three) times daily as needed. 12/03/18   Versie Starks, PA-C    Allergies Patient has no known allergies.  Family History  Problem Relation Age of Onset  . Cancer Mother 86       breast  . Asthma Mother   . Arthritis Mother   . Cancer Maternal Grandmother 44       breast, colon (pre-cancerous polyps)  . Anemia Maternal Grandmother   . Diabetes Paternal Grandmother   . Stroke Paternal Grandmother   . Anemia Sister   . Urinary tract infection Sister   .  Anemia Daughter   . Heart disease Maternal Grandfather   . COPD Paternal Grandfather   . Anemia Sister   . Urinary tract infection Sister     Social History Social History   Tobacco Use  . Smoking status: Current Every Day Smoker    Packs/day: 1.00    Years: 23.00    Pack years: 23.00    Types: Cigarettes  . Smokeless tobacco: Never Used  Substance Use Topics  . Alcohol use: Yes    Alcohol/week: 4.0 standard drinks    Types: 4  Shots of liquor per week  . Drug use: Not Currently    Types: Marijuana    Comment: patient no longer smoking    Review of Systems  Constitutional: Denies fever/chills Eyes: No visual changes. ENT: Denies sore throat. Respiratory: Positive productive cough with green mucus Genitourinary: Negative for dysuria. Musculoskeletal: Positive for back pain. Skin: Negative for rash.    ____________________________________________   PHYSICAL EXAM:  VITAL SIGNS: ED Triage Vitals  Enc Vitals Group     BP 12/03/18 0935 (!) 148/98     Pulse Rate 12/03/18 0935 68     Resp 12/03/18 0935 18     Temp 12/03/18 0935 98.3 F (36.8 C)     Temp Source 12/03/18 0935 Oral     SpO2 12/03/18 0935 99 %     Weight 12/03/18 0909 160 lb (72.6 kg)     Height 12/03/18 0909 5\' 2"  (1.575 m)     Head Circumference --      Peak Flow --      Pain Score 12/03/18 0909 5     Pain Loc --      Pain Edu? --      Excl. in [place]? --     Constitutional: Alert and oriented. Well appearing and in no acute distress. Eyes: Conjunctivae are normal.  Head: Atraumatic. Nose: No congestion/rhinnorhea. Mouth/Throat: Mucous membranes are moist.   Neck:  supple no lymphadenopathy noted Cardiovascular: Normal rate, regular rhythm. Heart sounds are normal Respiratory: Normal respiratory effort.  No retractions, lungs with wheezing bilaterally GU: deferred Musculoskeletal: FROM all extremities, warm and well perfused, SI joints are tender, no CVA tenderness, Neurologic:  Normal speech and language.  Skin:  Skin is warm, dry and intact. No rash noted. Psychiatric: Mood and affect are normal. Speech and behavior are normal.  ____________________________________________   LABS (all labs ordered are listed, but only abnormal results are displayed)  Labs Reviewed  URINALYSIS, COMPLETE (UACMP) WITH MICROSCOPIC - Abnormal; Notable for the following components:      Result Value   Color, Urine YELLOW (*)    APPearance  CLEAR (*)    All other components within normal limits  POC URINE PREG, ED  POCT PREGNANCY, URINE   ____________________________________________   ____________________________________________  RADIOLOGY    ____________________________________________   PROCEDURES  Procedure(s) performed: No  Procedures    ____________________________________________   INITIAL IMPRESSION / ASSESSMENT AND PLAN / ED COURSE  Pertinent labs & imaging results that were available during my care of the patient were reviewed by me and considered in my medical decision making (see chart for details).   Patient is a 48 year old female who complains of URI symptoms.  Basic medical exam was performed    Urine pregnancy negative, UA is negative  Patient's vital signs are stable.  I feel that patient is low risk for COVID 19 due to no recent travel or exposure and the length of the illness.  This patient currently does not meet criteria for testing for COVID 19 and I explained that in detail to the patient.  The evaluation today is reassuring with no evidence of emergent medical condition that requires further work-up or evaluation or inpatient treatment.  I provided follow-up recommendations and strict return precautions.  The patient understands and agrees with the plan.  She is given a prescription for amoxicillin, albuterol inhaler, and Flexeril.  BURNETT LIEBER was evaluated in Emergency Department on 12/03/2018 for the symptoms described in the history of present illness. She was evaluated in the context of the global COVID-19 pandemic, which necessitated consideration that the patient might be at risk for infection with the SARS-CoV-2 virus that causes COVID-19. Institutional protocols and algorithms that pertain to the evaluation of patients at risk for COVID-19 are in a state of rapid change based on information released by regulatory bodies including the CDC and federal and state organizations.  These policies and algorithms were followed during the patient's care in the ED.   As part of my medical decision making, I reviewed the following data within the [place] notes reviewed and incorporated, Labs reviewed UA is normal, urine pregnancy negative, Old chart reviewed, Notes from prior ED visits and [place] Controlled Substance Database  ____________________________________________   FINAL CLINICAL IMPRESSION(S) / ED DIAGNOSES  Final diagnoses:  Acute bronchitis, unspecified organism  Acute midline low back pain without sciatica      NEW MEDICATIONS STARTED DURING THIS VISIT:  New Prescriptions   ALBUTEROL (PROVENTIL HFA;VENTOLIN HFA) 108 (90 BASE) MCG/ACT INHALER    Inhale 2 puffs into the lungs every 6 (six) hours as needed for wheezing or shortness of breath.   AMOXICILLIN (AMOXIL) 500 MG CAPSULE    Take 1 capsule (500 mg total) by mouth 3 (three) times daily.   CYCLOBENZAPRINE (FLEXERIL) 10 MG TABLET    Take 1 tablet (10 mg total) by mouth 3 (three) times daily as needed.     Note:  This document was prepared using Dragon voice recognition software and may include unintentional dictation errors.    Versie Starks, PA-C 12/03/18 3491    Earleen Newport, MD 12/03/18 1110

## 2018-12-03 NOTE — ED Triage Notes (Signed)
Patient complaining of lower back pain on the left side.  Hx of renal staghorns, states she has had 3 surgeries.  States she can "be walking and it hits me all at once".  States her urine is cloudy X 3 weeks. States she has had cough X 3 weeks, and believes it's bronchitis.  Denies fever.  Productive cough, green phlegm.

## 2018-12-03 NOTE — ED Notes (Signed)
See triage note  Presents with lower back pain for about 1 week    Denies any injury and is unsure if this is urinary related

## 2018-12-03 NOTE — ED Notes (Signed)
First Nurse Note: Reviewed patient with Brittany Herrera, patient to go to [facility].  Linda aware that patient needs VS.

## 2018-12-03 NOTE — Discharge Instructions (Addendum)
Follow-up with your regular doctor if not better in 3 days.  Return emergency department if worsening.  Take the medications as prescribed.

## 2019-02-02 ENCOUNTER — Emergency Department: Payer: 59

## 2019-02-02 ENCOUNTER — Inpatient Hospital Stay
Admission: EM | Admit: 2019-02-02 | Discharge: 2019-02-05 | DRG: 872 | Disposition: A | Discharge status: Home / Self Care | Payer: 59 | Attending: Internal Medicine | Admitting: Internal Medicine

## 2019-02-02 ENCOUNTER — Other Ambulatory Visit: Payer: Self-pay

## 2019-02-02 ENCOUNTER — Inpatient Hospital Stay: Payer: 59

## 2019-02-02 ENCOUNTER — Encounter: Payer: Self-pay | Admitting: Emergency Medicine

## 2019-02-02 DIAGNOSIS — Z823 Family history of stroke: Secondary | ICD-10-CM

## 2019-02-02 DIAGNOSIS — A419 Sepsis, unspecified organism: Secondary | ICD-10-CM | POA: Insufficient documentation

## 2019-02-02 DIAGNOSIS — Z8249 Family history of ischemic heart disease and other diseases of the circulatory system: Secondary | ICD-10-CM

## 2019-02-02 DIAGNOSIS — B9689 Other specified bacterial agents as the cause of diseases classified elsewhere: Secondary | ICD-10-CM | POA: Diagnosis present

## 2019-02-02 DIAGNOSIS — Z8261 Family history of arthritis: Secondary | ICD-10-CM

## 2019-02-02 DIAGNOSIS — F17210 Nicotine dependence, cigarettes, uncomplicated: Secondary | ICD-10-CM | POA: Diagnosis present

## 2019-02-02 DIAGNOSIS — Z833 Family history of diabetes mellitus: Secondary | ICD-10-CM

## 2019-02-02 DIAGNOSIS — Z803 Family history of malignant neoplasm of breast: Secondary | ICD-10-CM | POA: Diagnosis not present

## 2019-02-02 DIAGNOSIS — Z791 Long term (current) use of non-steroidal anti-inflammatories (NSAID): Secondary | ICD-10-CM | POA: Diagnosis not present

## 2019-02-02 DIAGNOSIS — N10 Acute tubulo-interstitial nephritis: Secondary | ICD-10-CM | POA: Diagnosis present

## 2019-02-02 DIAGNOSIS — Z20828 Contact with and (suspected) exposure to other viral communicable diseases: Secondary | ICD-10-CM | POA: Diagnosis present

## 2019-02-02 DIAGNOSIS — R300 Dysuria: Secondary | ICD-10-CM | POA: Diagnosis not present

## 2019-02-02 DIAGNOSIS — N12 Tubulo-interstitial nephritis, not specified as acute or chronic: Secondary | ICD-10-CM

## 2019-02-02 DIAGNOSIS — E876 Hypokalemia: Secondary | ICD-10-CM | POA: Diagnosis present

## 2019-02-02 DIAGNOSIS — Z79899 Other long term (current) drug therapy: Secondary | ICD-10-CM | POA: Diagnosis not present

## 2019-02-02 DIAGNOSIS — Z825 Family history of asthma and other chronic lower respiratory diseases: Secondary | ICD-10-CM

## 2019-02-02 DIAGNOSIS — Z87442 Personal history of urinary calculi: Secondary | ICD-10-CM | POA: Diagnosis not present

## 2019-02-02 DIAGNOSIS — N200 Calculus of kidney: Secondary | ICD-10-CM | POA: Diagnosis present

## 2019-02-02 DIAGNOSIS — T43204A Poisoning by unspecified antidepressants, undetermined, initial encounter: Secondary | ICD-10-CM | POA: Diagnosis present

## 2019-02-02 LAB — CBC WITH DIFFERENTIAL/PLATELET
Abs Immature Granulocytes: 0.19 10*3/uL — ABNORMAL HIGH (ref 0.00–0.07)
Basophils Absolute: 0.1 10*3/uL (ref 0.0–0.1)
Basophils Relative: 0 %
Eosinophils Absolute: 0 10*3/uL (ref 0.0–0.5)
Eosinophils Relative: 0 %
HCT: 41.3 % (ref 36.0–46.0)
Hemoglobin: 14.3 g/dL (ref 12.0–15.0)
Immature Granulocytes: 1 %
Lymphocytes Relative: 6 %
Lymphs Abs: 1.2 10*3/uL (ref 0.7–4.0)
MCH: 34.1 pg — ABNORMAL HIGH (ref 26.0–34.0)
MCHC: 34.6 g/dL (ref 30.0–36.0)
MCV: 98.6 fL (ref 80.0–100.0)
Monocytes Absolute: 1.1 10*3/uL — ABNORMAL HIGH (ref 0.1–1.0)
Monocytes Relative: 5 %
Neutro Abs: 18.8 10*3/uL — ABNORMAL HIGH (ref 1.7–7.7)
Neutrophils Relative %: 88 %
Platelets: 219 10*3/uL (ref 150–400)
RBC: 4.19 MIL/uL (ref 3.87–5.11)
RDW: 13.9 % (ref 11.5–15.5)
WBC: 21.4 10*3/uL — ABNORMAL HIGH (ref 4.0–10.5)
nRBC: 0 % (ref 0.0–0.2)

## 2019-02-02 LAB — HEPATIC FUNCTION PANEL
ALT: 16 U/L (ref 0–44)
AST: 22 U/L (ref 15–41)
Albumin: 3.6 g/dL (ref 3.5–5.0)
Alkaline Phosphatase: 75 U/L (ref 38–126)
Bilirubin, Direct: 0.2 mg/dL (ref 0.0–0.2)
Indirect Bilirubin: 0.6 mg/dL (ref 0.3–0.9)
Total Bilirubin: 0.8 mg/dL (ref 0.3–1.2)
Total Protein: 6.5 g/dL (ref 6.5–8.1)

## 2019-02-02 LAB — BASIC METABOLIC PANEL
Anion gap: 9 (ref 5–15)
BUN: 9 mg/dL (ref 6–20)
CO2: 23 mmol/L (ref 22–32)
Calcium: 8.9 mg/dL (ref 8.9–10.3)
Chloride: 105 mmol/L (ref 98–111)
Creatinine, Ser: 0.88 mg/dL (ref 0.44–1.00)
GFR calc Af Amer: >60 mL/min (ref >60)
GFR calc non Af Amer: >60 mL/min (ref >60)
Glucose, Bld: 151 mg/dL — ABNORMAL HIGH (ref 70–99)
Potassium: 3.4 mmol/L — ABNORMAL LOW (ref 3.5–5.1)
Sodium: 137 mmol/L (ref 135–145)

## 2019-02-02 LAB — URINALYSIS, COMPLETE (UACMP) WITH MICROSCOPIC
Bilirubin Urine: NEGATIVE
Glucose, UA: NEGATIVE mg/dL
Ketones, ur: NEGATIVE mg/dL
Nitrite: NEGATIVE
Protein, ur: 100 mg/dL — AB
Specific Gravity, Urine: 1.015 (ref 1.005–1.030)
pH: 7 (ref 5.0–8.0)

## 2019-02-02 LAB — LACTIC ACID, PLASMA: Lactic Acid, Venous: 1.3 mmol/L (ref 0.5–1.9)

## 2019-02-02 LAB — SARS CORONAVIRUS 2 BY RT PCR (HOSPITAL ORDER, PERFORMED IN ~~LOC~~ HOSPITAL LAB): SARS Coronavirus 2: NEGATIVE

## 2019-02-02 LAB — PREGNANCY, URINE: Preg Test, Ur: NEGATIVE

## 2019-02-02 MED ORDER — THIAMINE HCL 100 MG/ML IJ SOLN
100.0000 mg | Freq: Every day | INTRAMUSCULAR | Status: DC
  Administered 2019-02-03 (×2): 100 mg via INTRAVENOUS
  Filled 2019-02-02 (×2): qty 2

## 2019-02-02 MED ORDER — HYDROCODONE-ACETAMINOPHEN 5-325 MG PO TABS: 1.0000 | ORAL_TABLET | Freq: Four times a day (QID) | ORAL | Status: DC | PRN

## 2019-02-02 MED ORDER — SODIUM CHLORIDE 0.9 % IV SOLN
1.0000 g | Freq: Once | INTRAVENOUS | Status: AC
  Administered 2019-02-02: 1 g via INTRAVENOUS
  Filled 2019-02-02: qty 1

## 2019-02-02 MED ORDER — ONDANSETRON HCL 4 MG/2ML IJ SOLN: 4.0000 mg | Freq: Four times a day (QID) | INTRAMUSCULAR | Status: DC | PRN

## 2019-02-02 MED ORDER — SODIUM CHLORIDE 0.9 % IV SOLN
INTRAVENOUS | Status: DC
  Administered 2019-02-03: via INTRAVENOUS

## 2019-02-02 MED ORDER — ENOXAPARIN SODIUM 40 MG/0.4ML ~~LOC~~ SOLN
40.0000 mg | SUBCUTANEOUS | Status: DC
  Administered 2019-02-03 – 2019-02-04 (×3): 40 mg via SUBCUTANEOUS
  Filled 2019-02-02 (×3): qty 0.4

## 2019-02-02 MED ORDER — SODIUM CHLORIDE 0.9 % IV SOLN
1.0000 g | Freq: Once | INTRAVENOUS | Status: AC
  Administered 2019-02-03: 1 g via INTRAVENOUS
  Filled 2019-02-02: qty 1

## 2019-02-02 MED ORDER — SODIUM CHLORIDE 0.9 % IV BOLUS
1000.0000 mL | Freq: Once | INTRAVENOUS | Status: AC
  Administered 2019-02-02: 1000 mL via INTRAVENOUS

## 2019-02-02 MED ORDER — SODIUM CHLORIDE 0.9 % IV SOLN
2.0000 g | Freq: Three times a day (TID) | INTRAVENOUS | Status: DC
  Administered 2019-02-03: 2 g via INTRAVENOUS
  Filled 2019-02-02 (×3): qty 2

## 2019-02-02 MED ORDER — FOLIC ACID 5 MG/ML IJ SOLN: 1.0000 mg | Freq: Every day | INTRAMUSCULAR | Status: DC

## 2019-02-02 MED ORDER — SODIUM CHLORIDE 0.9 % IV SOLN: 2.0000 g | Freq: Once | INTRAVENOUS | Status: DC

## 2019-02-02 MED ORDER — ONDANSETRON HCL 4 MG PO TABS: 4.0000 mg | ORAL_TABLET | Freq: Four times a day (QID) | ORAL | Status: DC | PRN

## 2019-02-02 NOTE — H&P (Signed)
[facility] [place] NAME: Brittany Herrera    MR#:  [id number]  DATE OF BIRTH:  10-Dec-1970  DATE OF ADMISSION:  02/02/2019  PRIMARY CARE PHYSICIAN: [name], MD   REQUESTING/REFERRING PHYSICIAN: Conni Slipper, MD  CHIEF COMPLAINT:   Chief Complaint  Patient presents with  . Dysuria    HISTORY OF PRESENT ILLNESS:  Brittany Herrera  is a 48 y.o. female with a known history of kidney stones.  She presented to the emergency room complaining of bilateral low back pain present for 3 days.  Incidentally she reports having had dental extraction 4 days ago.  She has noted a temp of 101 over the last 3 days with fatigue/malaise.  Patient reports feeling swollen all over.  She reports a 3-day history of dysuria, urinary urgency, frequency, as well as dark and malodorous urine.  She has been nauseated throughout the last 3 days with vomiting this a.m.  She denies diarrhea.  She has noted no hematuria.  She denies chest pain, shortness of breath, palpitations, abdominal pain, hematemesis, hematochezia, or melena.  Chest x-ray on arrival demonstrates no active disease.  CT abdomen and pelvis demonstrates fat stranding around the right kidney and ureter without hydronephrosis.  Punctate, nonobstructive calculus in the superior pole.  Findings consistent with pyelonephritis and or recently passed calculus.  Also multiple nonobstructive left renal calculi present.  Elevated WBC of 21.4 with BUN of 9 creatinine 0.88.  Potassium is 3.0.  Blood and urine culture are pending.  Patient was initially treated with IV cefepime in the emergency room.  This has been continued.  She has been admitted to the hospitalist service for further management.  PAST MEDICAL HISTORY:   Past Medical History:  Diagnosis Date  . Abnormal Pap smear 2001   [facility]- told her that she had polyps didnt get tx  . Anemia    ONLY WITH PREGNANCY  . DUB (dysfunctional  uterine bleeding)    menorrhagia, and bleeding continuously x3 yrs  . Family history of adverse reaction to anesthesia    MOM-HAS HARD TIME WAKING UP  . Kidney stones 2011, 2012  . Stag[place] kidney stones     PAST SURGICAL HISTORY:   Past Surgical History:  Procedure Laterality Date  . CYSTOSCOPY W/ URETERAL STENT PLACEMENT Left 04/22/2015   Procedure: CYSTOSCOPY WITH STENT REPLACEMENT;  Surgeon: Collier Flowers, MD;  Location: [facility];  Service: Urology;  Laterality: Left;  . CYSTOSCOPY/URETEROSCOPY/HOLMIUM LASER/STENT PLACEMENT Left 04/08/2015   Procedure: CYSTOSCOPY/URETEROSCOPY/HOLMIUM LASER/STENT PLACEMENT;  Surgeon: [name], MD;  Location: [facility];  Service: Urology;  Laterality: Left;  . LITHOTRIPSY    . URETEROSCOPY WITH HOLMIUM LASER LITHOTRIPSY Left 04/22/2015   Procedure: URETEROSCOPY WITH HOLMIUM LASER LITHOTRIPSY;  Surgeon: Collier Flowers, MD;  Location: [facility];  Service: Urology;  Laterality: Left;    SOCIAL HISTORY:   Social History   Tobacco Use  . Smoking status: Current Every Day Smoker    Packs/day: 1.00    Years: 23.00    Pack years: 23.00    Types: Cigarettes  . Smokeless tobacco: Never Used  Substance Use Topics  . Alcohol use: Yes    Alcohol/week: 4.0 standard drinks    Types: 4 Shots of liquor per week    FAMILY HISTORY:   Family History  Problem Relation Age of Onset  . Cancer Mother 51       breast  . Asthma Mother   .  Arthritis Mother   . Cancer Maternal Grandmother 15       breast, colon (pre-cancerous polyps)  . Anemia Maternal Grandmother   . Diabetes Paternal Grandmother   . Stroke Paternal Grandmother   . Anemia Sister   . Urinary tract infection Sister   . Anemia Daughter   . Heart disease Maternal Grandfather   . COPD Paternal Grandfather   . Anemia Sister   . Urinary tract infection Sister     DRUG ALLERGIES:  No Known Allergies  REVIEW OF SYSTEMS:   Review of Systems  Constitutional: Positive for chills, fever  and malaise/fatigue. Negative for diaphoresis and weight loss.  HENT: Positive for sinus pain. Negative for congestion, nosebleeds and sore throat.   Eyes: Negative for blurred vision, double vision and pain.  Respiratory: Negative for cough, sputum production, shortness of breath and wheezing.   Cardiovascular: Negative for chest pain, palpitations and leg swelling.  Gastrointestinal: Positive for abdominal pain, nausea and vomiting. Negative for blood in stool, constipation, diarrhea and heartburn.  Genitourinary: Positive for dysuria, flank pain, frequency and urgency. Negative for hematuria.  Musculoskeletal: Positive for back pain and myalgias. Negative for falls, joint pain and neck pain.  Skin: Negative for itching and rash.  Neurological: Negative for dizziness, seizures, loss of consciousness and headaches.  Psychiatric/Behavioral: Negative.      MEDICATIONS AT HOME:   Prior to Admission medications   Medication Sig Start Date End Date Taking? Authorizing Provider  acetaminophen (TYLENOL) 500 MG tablet Take 500 mg by mouth every 6 (six) hours as needed.   Yes [provider]  naproxen (NAPROSYN) 500 MG tablet Take 500 mg by mouth 2 (two) times daily with a meal.   Yes [provider]  albuterol (PROVENTIL HFA;VENTOLIN HFA) 108 (90 Base) MCG/ACT inhaler Inhale 2 puffs into the lungs every 6 (six) hours as needed for wheezing or shortness of breath. Patient not taking: Reported on 02/02/2019 12/03/18   Versie Starks, PA-C  amoxicillin (AMOXIL) 500 MG capsule Take 1 capsule (500 mg total) by mouth 3 (three) times daily. Patient not taking: Reported on 02/02/2019 12/03/18   Versie Starks, PA-C  buPROP[facility]) 75 MG tablet Take 2 tablets (150 mg total) by mouth 2 (two) times daily. Patient not taking: Reported on 02/02/2019 04/11/18   [name], MD  cyclobenzaprine (FLEXERIL) 10 MG tablet Take 1 tablet (10 mg total) by mouth 3 (three) times daily as needed.  Patient not taking: Reported on 02/02/2019 12/03/18   Versie Starks, PA-C      VITAL SIGNS:  Blood pressure (!) 152/93, pulse 90, temperature 99.8 F (37.7 C), resp. rate (!) 26, height 5\' 2"  (1.575 m), weight 72.6 kg, SpO2 98 %.  PHYSICAL EXAMINATION:  Physical Exam Vitals signs and nursing note reviewed.  Constitutional:      Appearance: Normal appearance.  HENT:     Head: Normocephalic.     Right Ear: External ear normal.     Left Ear: External ear normal.     Nose: Nose normal.     Mouth/Throat:     Mouth: Mucous membranes are moist.  Eyes:     General: No scleral icterus.    Extraocular Movements: Extraocular movements intact.     Conjunctiva/sclera: Conjunctivae normal.     Pupils: Pupils are equal, round, and reactive to light.  Neck:     Musculoskeletal: Normal range of motion and neck supple. No muscular tenderness.  Cardiovascular:  Pulses: Normal pulses.     Heart sounds: Normal heart sounds. No murmur. No friction rub. No gallop.   Pulmonary:     Effort: Pulmonary effort is normal.     Breath sounds: Normal breath sounds.  Abdominal:     General: Abdomen is flat. Bowel sounds are normal. There is distension (lower abdomen).     Palpations: Abdomen is soft.  Musculoskeletal: Normal range of motion.        General: No swelling or tenderness.     Right lower leg: No edema.     Left lower leg: No edema.     Comments: Bilateral flank tenderness  Skin:    General: Skin is warm and dry.     Capillary Refill: Capillary refill takes less than 2 seconds.     Findings: No rash.  Neurological:     General: No focal deficit present.     Mental Status: She is alert and oriented to person, place, and time.  Psychiatric:        Mood and Affect: Mood normal.        Behavior: Behavior normal.       LABORATORY PANEL:   CBC Recent Labs  Lab 02/02/19 1622  WBC 21.4*  HGB 14.3  HCT 41.3  PLT 219    ------------------------------------------------------------------------------------------------------------------  Chemistries  Recent Labs  Lab 02/02/19 1622  NA 137  K 3.4*  CL 105  CO2 23  GLUCOSE 151*  BUN 9  CREATININE 0.88  CALCIUM 8.9  AST 22  ALT 16  ALKPHOS 75  BILITOT 0.8   ------------------------------------------------------------------------------------------------------------------  Cardiac Enzymes No results for input(s): TROPONINI in the last 168 hours. ------------------------------------------------------------------------------------------------------------------  RADIOLOGY:  Dg Chest Portable 1 View  Result Date: 02/02/2019 CLINICAL DATA:  Body swelling EXAM: PORTABLE CHEST 1 VIEW COMPARISON:  10/22/2016 FINDINGS: The heart size and mediastinal contours are within normal limits. Both lungs are clear. The visualized skeletal structures are unremarkable. IMPRESSION: No acute abnormality of the lungs in AP portable projection. Electronically Signed   By: Eddie Candle M.D.   On: 02/02/2019 19:01   Ct Renal Stone Study  Result Date: 02/02/2019 CLINICAL DATA:  Body swelling EXAM: CT ABDOMEN AND PELVIS WITHOUT CONTRAST TECHNIQUE: Multidetector CT imaging of the abdomen and pelvis was performed following the standard protocol without IV contrast. COMPARISON:  10/28/2017 FINDINGS: Lower chest: No acute abnormality. Hepatobiliary: No solid liver abnormality is seen. No gallstones, gallbladder wall thickening, or biliary dilatation. Pancreas: Unremarkable. No pancreatic ductal dilatation or surrounding inflammatory changes. Spleen: Normal in size without significant abnormality. Adrenals/Urinary Tract: Adrenal glands are unremarkable. There is fat stranding about the right kidney and ureter without hydronephrosis. A previously seen inferior pole calculus is no longer noted and presumably passed. There is a punctuate, nonobstructive calculus of the superior pole. There are  multiple nonobstructive left renal calculi. Bladder is unremarkable. Stomach/Bowel: Stomach is within normal limits. Appendix appears normal. No evidence of bowel wall thickening, distention, or inflammatory changes. Vascular/Lymphatic: No significant vascular findings are present. No enlarged abdominal or pelvic lymph nodes. Reproductive: No mass or other significant abnormality. Other: No abdominal wall hernia or abnormality. Small volume nonspecific free fluid in the low pelvis. Musculoskeletal: No acute or significant osseous findings. IMPRESSION: 1. There is fat stranding about the right kidney and ureter without hydronephrosis. A previously seen inferior pole calculus is no longer noted and presumably passed. There is a punctuate, nonobstructive calculus of the superior pole. Findings are consistent with pyelonephritis and/or recently passed  calculus. There are multiple nonobstructive left renal calculi. 2. Small volume nonspecific free fluid in the low pelvis, which may be functional or reactive. Electronically Signed   By: Eddie Candle M.D.   On: 02/02/2019 19:45      IMPRESSION AND PLAN:    1.  Sepsis - Secondary to UTI/pyelonephritis - Patient received IV fluid bolus in the emergency room currently with normal saline infusing to peripheral IV at 75 cc/h. - Will follow lactic acid as per protocol - We will repeat CBC and BMP in the a.m. and continue to monitor leukocytosis closely - IV cefepime initiated in the emergency room and has been continued. - We will follow results of blood cultures and urine culture and adjust treatment as indicated.  2.  Pyelonephritis - IV cefepime continued - Follow results of blood and urine cultures - Repeat CBC and BMP in the a.m. - Continue to monitor renal function closely  3.  Hypokalemia-mild with potassium 3.0 --Patient received p.o. potassium replacement -We will repeat BMP in the a.m. -Telemetry monitoring  4. Leukocytosis - Likely  secondary to pyelonephritis - Repeat CBC in the a.m. - Will monitor response to IV antibiotics  DVT and PPI prophylaxis have been initiated    All the records are reviewed and case discussed with ED provider. The plan of care was discussed in details with the patient (and family). I answered all questions. The patient agreed to proceed with the above mentioned plan. Further management will depend upon hospital course.   CODE STATUS: Full code  TOTAL TIME TAKING CARE OF THIS PATIENT:32minutes.    [place] on 02/02/2019 at 10:37 PM  Pager - [phone]  After 6pm go to [email or web address] - Proofreader  [facility]  Office  [phone]  CC: Primary care physician; [name], MD   Note: This dictation was prepared with Dragon dictation along with smaller phrase technology. Any transcriptional errors that result from this process are unintentional.

## 2019-02-02 NOTE — ED Notes (Signed)
Patient states that she is not taking any home medicines. She has been taking naproxen 500mg  BID with two extra strength tylenol since visiting the dentist on 01/30/19.

## 2019-02-02 NOTE — ED Notes (Signed)
ED TO INPATIENT HANDOFF REPORT  ED Nurse Name and Phone #:  Anne/Orry Sigl [phone]  S Name/Age/Gender Brittany Herrera 48 y.o. female Room/Bed: ED26A/ED26A  Code Status   Code Status: Full Code  Home/SNF/Other Home Patient oriented to: self, place, time and situation Is this baseline? Yes   Triage Complete: Triage complete  Chief Complaint body swelling  Triage Note First Nurse Note:  C/O body swelling x 1 day.  States has had similar symptoms in the past and it was her kidney's.  Patient AAOx3.  MAE.  Ambulates with easy and steady gait.  NAD  Pt to ED via POV c/o "body swelling". Pt states that she feels like her whole body is swollen. Pt also c/o bilateral back pain in the kidney area and trouble with urination. Pt is in NAD.    Allergies No Known Allergies  Level of Care/Admitting Diagnosis ED Disposition    ED Disposition Condition [facility] Area: [place] [[id number]]  Level of Care: ICU [6]  Covid Evaluation: Confirmed COVID Negative  Diagnosis: Overdose of antidepressant, undetermined intent, initial encounter [[id number]]  Admitting Physician: Christel Mormon [[id number]]  Attending Physician: Christel Mormon [[id number]]  Estimated length of stay: 3 - 4 days  Certification:: I certify this patient will need inpatient services for at least 2 midnights  PT Class (Do Not Modify): Inpatient [101]  PT Acc Code (Do Not Modify): Private [1]       B Medical/Surgery History Past Medical History:  Diagnosis Date  . Abnormal Pap smear 2001   [facility]- told her that she had polyps didnt get tx  . Anemia    ONLY WITH PREGNANCY  . DUB (dysfunctional uterine bleeding)    menorrhagia, and bleeding continuously x3 yrs  . Family history of adverse reaction to anesthesia    MOM-HAS HARD TIME WAKING UP  . Kidney stones 2011, 2012  . [place] kidney stones    Past Surgical History:  Procedure Laterality Date  . CYSTOSCOPY W/  URETERAL STENT PLACEMENT Left 04/22/2015   Procedure: CYSTOSCOPY WITH STENT REPLACEMENT;  Surgeon: Collier Flowers, MD;  Location: [facility] ORS;  Service: Urology;  Laterality: Left;  . CYSTOSCOPY/URETEROSCOPY/HOLMIUM LASER/STENT PLACEMENT Left 04/08/2015   Procedure: CYSTOSCOPY/URETEROSCOPY/HOLMIUM LASER/STENT PLACEMENT;  Surgeon: [name], MD;  Location: [facility];  Service: Urology;  Laterality: Left;  . LITHOTRIPSY    . URETEROSCOPY WITH HOLMIUM LASER LITHOTRIPSY Left 04/22/2015   Procedure: URETEROSCOPY WITH HOLMIUM LASER LITHOTRIPSY;  Surgeon: Collier Flowers, MD;  Location: [facility] OR[facility];  Service: Urology;  Laterality: Left;     A IV Location/Drains/Wounds Patient Lines/Drains/Airways Status   Active Line/Drains/Airways    Name:   Placement date:   Placement time:   Site:   Days:   Peripheral IV 02/02/19 Left Forearm   02/02/19    1814    Forearm   less than 1   Peripheral IV 02/02/19 Left Antecubital   02/02/19    1820    Antecubital   less than 1   Ureteral Drain/Stent Left ureter 6 Fr.   04/22/15    -    Left ureter   1382   Incision (Closed) 04/08/15 Vagina   04/08/15    1122     1396   Incision (Closed) 04/22/15 Vagina   04/22/15    0804     1382          Intake/Output Last 24 hours  Intake/Output Summary (  Last 24 hours) at 02/02/2019 2219 Last data filed at 02/02/2019 2130 Gross per 24 hour  Intake 1100 ml  Output -  Net 1100 ml    Labs/Imaging Results for orders placed or performed during the hospital encounter of 02/02/19 (from the past 48 hour(s))  Urinalysis, Complete w Microscopic     Status: Abnormal   Collection Time: 02/02/19  4:22 PM  Result Value Ref Range   Color, Urine YELLOW (A) YELLOW   APPearance HAZY (A) CLEAR   Specific Gravity, Urine 1.015 1.005 - 1.030   pH 7.0 5.0 - 8.0   Glucose, UA NEGATIVE NEGATIVE mg/dL   Hgb urine dipstick SMALL (A) NEGATIVE   Bilirubin Urine NEGATIVE NEGATIVE   Ketones, ur NEGATIVE NEGATIVE mg/dL   Protein, ur 100 (A) NEGATIVE  mg/dL   Nitrite NEGATIVE NEGATIVE   Leukocytes,Ua SMALL (A) NEGATIVE   RBC / HPF 6-10 0 - 5 RBC/hpf   WBC, UA 11-20 0 - 5 WBC/hpf   Bacteria, UA RARE (A) NONE SEEN   Squamous Epithelial / LPF 6-10 0 - 5    Comment: Performed at [facility], [address] [place], [place] [address]  CBC with Differential     Status: Abnormal   Collection Time: 02/02/19  4:22 PM  Result Value Ref Range   WBC 21.4 (H) 4.0 - 10.5 K/uL   RBC 4.19 3.87 - 5.11 MIL/uL   Hemoglobin 14.3 12.0 - 15.0 g/dL   HCT 41.3 36.0 - 46.0 %   MCV 98.6 80.0 - 100.0 fL   MCH 34.1 (H) 26.0 - 34.0 pg   MCHC 34.6 30.0 - 36.0 g/dL   RDW 13.9 11.5 - 15.5 %   Platelets 219 150 - 400 K/uL   nRBC 0.0 0.0 - 0.2 %   Neutrophils Relative % 88 %   Neutro Abs 18.8 (H) 1.7 - 7.7 K/uL   Lymphocytes Relative 6 %   Lymphs Abs 1.2 0.7 - 4.0 K/uL   Monocytes Relative 5 %   Monocytes Absolute 1.1 (H) 0.1 - 1.0 K/uL   Eosinophils Relative 0 %   Eosinophils Absolute 0.0 0.0 - 0.5 K/uL   Basophils Relative 0 %   Basophils Absolute 0.1 0.0 - 0.1 K/uL   Immature Granulocytes 1 %   Abs Immature Granulocytes 0.19 (H) 0.00 - 0.07 K/uL    Comment: Performed at [facility], [address] [place], [place] [address]  Basic metabolic panel     Status: Abnormal   Collection Time: 02/02/19  4:22 PM  Result Value Ref Range   Sodium 137 135 - 145 mmol/L   Potassium 3.4 (L) 3.5 - 5.1 mmol/L   Chloride 105 98 - 111 mmol/L   CO2 23 22 - 32 mmol/L   Glucose, Bld 151 (H) 70 - 99 mg/dL   BUN 9 6 - 20 mg/dL   Creatinine, Ser 0.88 0.44 - 1.00 mg/dL   Calcium 8.9 8.9 - 10.3 mg/dL   GFR calc non Af Amer >60 >60 mL/min   GFR calc Af Amer >60 >60 mL/min   Anion gap 9 5 - 15    Comment: Performed at [facility], [address] [place], [place] [address]  Hepatic function panel     Status: None   Collection Time: 02/02/19  4:22 PM  Result Value Ref Range   Total Protein 6.5 6.5 - 8.1 g/dL   Albumin 3.6 3.5 - 5.0 g/dL    AST 22 15 - 41  U/L   ALT 16 0 - 44 U/L   Alkaline Phosphatase 75 38 - 126 U/L   Total Bilirubin 0.8 0.3 - 1.2 mg/dL   Bilirubin, Direct 0.2 0.0 - 0.2 mg/dL   Indirect Bilirubin 0.6 0.3 - 0.9 mg/dL    Comment: Performed at [facility], [address] [place], [place] [address]  Pregnancy, urine     Status: None   Collection Time: 02/02/19  4:22 PM  Result Value Ref Range   Preg Test, Ur NEGATIVE NEGATIVE    Comment: Performed at [facility], [address] [place], [place] [address]  Lactic acid, plasma     Status: None   Collection Time: 02/02/19  5:53 PM  Result Value Ref Range   Lactic Acid, Venous 1.3 0.5 - 1.9 mmol/L    Comment: Performed at [facility], [address] [place], [place] [address]  SARS Coronavirus 2 ([place] - Performed in [place] hospital lab), Hosp Order     Status: None   Collection Time: 02/02/19  6:14 PM  Result Value Ref Range   SARS Coronavirus 2 NEGATIVE NEGATIVE    Comment: (NOTE) If result is NEGATIVE SARS-CoV-2 target nucleic acids are NOT DETECTED. The SARS-CoV-2 RNA is generally detectable in upper and lower  respiratory specimens during the acute phase of infection. The lowest  concentration of SARS-CoV-2 viral copies this assay can detect is 250  copies / mL. A negative result does not preclude SARS-CoV-2 infection  and should not be used as the sole basis for treatment or other  patient management decisions.  A negative result may occur with  improper specimen collection / handling, submission of specimen other  than nasopharyngeal swab, presence of viral mutation(s) within the  areas targeted by this assay, and inadequate number of viral copies  (<250 copies / mL). A negative result must be combined with clinical  observations, patient history, and epidemiological information. If result is POSITIVE SARS-CoV-2 target nucleic acids are DETECTED. The SARS-CoV-2 RNA is generally detectable in upper and  lower  respiratory specimens dur ing the acute phase of infection.  Positive  results are indicative of active infection with SARS-CoV-2.  Clinical  correlation with patient history and other diagnostic information is  necessary to determine patient infection status.  Positive results do  not rule out bacterial infection or co-infection with other viruses. If result is PRESUMPTIVE POSTIVE SARS-CoV-2 nucleic acids MAY BE PRESENT.   A presumptive positive result was obtained on the submitted specimen  and confirmed on repeat testing.  While 2019 novel coronavirus  (SARS-CoV-2) nucleic acids may be present in the submitted sample  additional confirmatory testing may be necessary for epidemiological  and / or clinical management purposes  to differentiate between  SARS-CoV-2 and other Sarbecovirus currently known to infect humans.  If clinically indicated additional testing with an alternate test  methodology [phone]) is advised. The SARS-CoV-2 RNA is generally  detectable in upper and lower respiratory sp ecimens during the acute  phase of infection. The expected result is Negative. Fact Sheet for Patients:  StrictlyIdeas.no Fact Sheet for Healthcare Providers: BankingDealers.co.za This test is not yet approved or cleared by the [place] FDA and has been authorized for detection and/or diagnosis of SARS-CoV-2 by FDA under an Emergency Use Authorization (EUA).  This EUA will remain in effect (meaning this test can be used) for the duration of the COVID-19 declaration under Section 564(b)(1) of the Act, 21 U.S.C. section 360bbb-3(b)(1), unless the  authorization is terminated or revoked sooner. Performed at [facility], [address] [place], [place] [address]    Dg Chest Portable 1 View  Result Date: 02/02/2019 CLINICAL DATA:  Body swelling EXAM: PORTABLE CHEST 1 VIEW COMPARISON:  10/22/2016 FINDINGS: The heart size and  mediastinal contours are within normal limits. Both lungs are clear. The visualized skeletal structures are unremarkable. IMPRESSION: No acute abnormality of the lungs in AP portable projection. Electronically Signed   By: Eddie Candle M.D.   On: 02/02/2019 19:01   Ct Renal Stone Study  Result Date: 02/02/2019 CLINICAL DATA:  Body swelling EXAM: CT ABDOMEN AND PELVIS WITHOUT CONTRAST TECHNIQUE: Multidetector CT imaging of the abdomen and pelvis was performed following the standard protocol without IV contrast. COMPARISON:  10/28/2017 FINDINGS: Lower chest: No acute abnormality. Hepatobiliary: No solid liver abnormality is seen. No gallstones, gallbladder wall thickening, or biliary dilatation. Pancreas: Unremarkable. No pancreatic ductal dilatation or surrounding inflammatory changes. Spleen: Normal in size without significant abnormality. Adrenals/Urinary Tract: Adrenal glands are unremarkable. There is fat stranding about the right kidney and ureter without hydronephrosis. A previously seen inferior pole calculus is no longer noted and presumably passed. There is a punctuate, nonobstructive calculus of the superior pole. There are multiple nonobstructive left renal calculi. Bladder is unremarkable. Stomach/Bowel: Stomach is within normal limits. Appendix appears normal. No evidence of bowel wall thickening, distention, or inflammatory changes. Vascular/Lymphatic: No significant vascular findings are present. No enlarged abdominal or pelvic lymph nodes. Reproductive: No mass or other significant abnormality. Other: No abdominal wall hernia or abnormality. Small volume nonspecific free fluid in the low pelvis. Musculoskeletal: No acute or significant osseous findings. IMPRESSION: 1. There is fat stranding about the right kidney and ureter without hydronephrosis. A previously seen inferior pole calculus is no longer noted and presumably passed. There is a punctuate, nonobstructive calculus of the superior pole.  Findings are consistent with pyelonephritis and/or recently passed calculus. There are multiple nonobstructive left renal calculi. 2. Small volume nonspecific free fluid in the low pelvis, which may be functional or reactive. Electronically Signed   By: Eddie Candle M.D.   On: 02/02/2019 19:45    Pending Labs Unresulted Labs (From admission, onward)    Start     Ordered   02/09/19 0500  Creatinine, serum  (enoxaparin (LOVENOX)    CrCl >/= 30 ml/min)  Weekly,   STAT    Comments:  while on enoxaparin therapy    02/02/19 2200   02/03/19 0500  CBC  Tomorrow morning,   STAT     02/02/19 2200   02/03/19 0500  Protime-INR  Tomorrow morning,   STAT     02/02/19 2200   02/03/19 0254  Basic metabolic panel  Now then every 4 hours,   STAT     02/02/19 2200   02/02/19 2706  Basic metabolic panel  Once,   STAT     02/02/19 2200   02/02/19 2159  Troponin I - Now Then Q6H  Now then every 6 hours,   STAT     02/02/19 2200   02/02/19 2157  TSH  Once,   STAT     02/02/19 2200   02/02/19 2157  Urinalysis, Routine w reflex microscopic  Once,   STAT     02/02/19 2200   02/02/19 2153  HIV antibody (Routine Testing)  Once,   STAT     02/02/19 2200   02/02/19 2153  CBC  (enoxaparin (LOVENOX)    CrCl >/=  30 ml/min)  Once,   STAT    Comments:  Baseline for enoxaparin therapy IF NOT ALREADY DRAWN.  Notify MD if PLT < 100 K.    02/02/19 2200   02/02/19 1744  Blood culture (routine x 2)  BLOOD CULTURE X 2,   STAT     02/02/19 1748          Vitals/Pain Today's Vitals   02/02/19 1840 02/02/19 1900 02/02/19 2100 02/02/19 2130  BP: (!) 143/90 (!) 144/83 134/80 (!) 152/93  Pulse: 88 94 (!) 102 90  Resp: (!) 26 20  (!) 26  Temp: 99.8 F (37.7 C)     TempSrc:      SpO2: 99% 100% 98% 98%  Weight:      Height:      PainSc:        Isolation Precautions No active isolations  Medications Medications  enoxaparin (LOVENOX) injection 40 mg (has no administration in time range)  0.9 %  sodium chloride  infusion (has no administration in time range)  ondansetron (ZOFRAN) tablet 4 mg (has no administration in time range)    Or  ondansetron (ZOFRAN) injection 4 mg (has no administration in time range)  folic acid injection 1 mg (has no administration in time range)  thiamine (B-1) injection 100 mg (has no administration in time range)  ceFEPIme (MAXIPIME) 1 g in sodium chloride 0.9 % 100 mL IVPB (0 g Intravenous Stopped 02/02/19 2130)  sodium chloride 0.9 % bolus 1,000 mL (0 mLs Intravenous Stopped 02/02/19 2130)    Mobility walks Low fall risk   Focused Assessments Cardiac Assessment Handoff:    Lab Results  Component Value Date   TROPONINI <0.03 10/22/2016   No results found for: DDIMER Does the Patient currently have chest pain? No  , Neuro Assessment Handoff:  Swallow screen pass? N/A         Neuro Assessment:   Neuro Checks:      Last Documented NIHSS Modified Score:   Has TPA been given? No If patient is a Neuro Trauma and patient is going to OR before floor call report to [place] nurse: [phone] or [phone]     R Recommendations: See Admitting Provider Note  Report given to:   Additional Notes:

## 2019-02-02 NOTE — Progress Notes (Signed)
Pharmacy Antibiotic Note  Brittany Herrera is a 48 y.o. female admitted on 02/02/2019 with sepsis s/t UTI.  Pharmacy has been consulted for cefepime dosing. Patient received cefepime 1g IV x 1 @ 1840, will give another 1g IV cefepime for a total of 2g. Spoke to ED RN and informed them of plan to give another 1g cefepime for a total of 2g before patient get's transferred to the floor.  Plan: Will start cefepime 2g IV q8h per CrCl > 60 ml/min  Will continue to monitor renal function and adjust dose as necessary.  Height: 5\' 2"  (157.5 cm) Weight: 160 lb (72.6 kg) IBW/kg (Calculated) : 50.1  Temp (24hrs), Avg:100.4 F (38 C), Min:99.8 F (37.7 C), Max:101 F (38.3 C)  Recent Labs  Lab 02/02/19 1622 02/02/19 1753  WBC 21.4*  --   CREATININE 0.88  --   LATICACIDVEN  --  1.3    Estimated Creatinine Clearance: 74.5 mL/min (by C-G formula based on SCr of 0.88 mg/dL).    No Known Allergies  Thank you for allowing pharmacy to be a part of this patient's care.  Tobie Lords, PharmD, BCPS Clinical Pharmacist  02/02/2019 10:47 PM

## 2019-02-02 NOTE — ED Triage Notes (Signed)
Pt to ED via POV c/o "body swelling". Pt states that she feels like her whole body is swollen. Pt also c/o bilateral back pain in the kidney area and trouble with urination. Pt is in NAD.

## 2019-02-02 NOTE — ED Notes (Signed)
Patient is currently vomiting.

## 2019-02-02 NOTE — ED Triage Notes (Signed)
First Nurse Note:  C/O body swelling x 1 day.  States has had similar symptoms in the past and it was her kidney's.  Patient AAOx3.  MAE.  Ambulates with easy and steady gait.  NAD

## 2019-02-02 NOTE — ED Provider Notes (Addendum)
[facility] Emergency Department Provider Note   ____________________________________________   First MD Initiated Contact with Patient 02/02/19 1649     (approximate)  I have reviewed the triage vital signs and the nursing notes.   HISTORY  Chief Complaint Dysuria    HPI Brittany Herrera is a 48 y.o. female who reports she had her tooth pulled 4 days ago.  She has been taking Tylenol Extra Strength since then but now has been feeling swollen all over and had a high fever.  She comes in complaining of low back pain bilaterally not in the midline.  She does have a fever.  She has a history of renal stones.         Past Medical History:  Diagnosis Date  . Abnormal Pap smear 2001   [facility]- told her that she had polyps didnt get tx  . Anemia    ONLY WITH PREGNANCY  . DUB (dysfunctional uterine bleeding)    menorrhagia, and bleeding continuously x3 yrs  . Family history of adverse reaction to anesthesia    MOM-HAS HARD TIME WAKING UP  . Kidney stones 2011, 2012  . [place] kidney stones     Patient Active Problem List   Diagnosis Date Noted  . Overdose of antidepressant, undetermined intent, initial encounter 02/02/2019  . Sepsis ([place]) 02/02/2019  . Menorrhagia 07/13/2018  . Elevated BP without diagnosis of hypertension 04/11/2018  . Encounter for counseling regarding contraception 04/11/2018  . Tobacco use disorder 04/11/2018  . Family history of breast cancer 04/11/2018  . Recurrent nephrolithiasis 03/24/2015  . Anxiety 03/21/2013    Past Surgical History:  Procedure Laterality Date  . CYSTOSCOPY W/ URETERAL STENT PLACEMENT Left 04/22/2015   Procedure: CYSTOSCOPY WITH STENT REPLACEMENT;  Surgeon: Collier Flowers, MD;  Location: [facility] ORS;  Service: Urology;  Laterality: Left;  . CYSTOSCOPY/URETEROSCOPY/HOLMIUM LASER/STENT PLACEMENT Left 04/08/2015   Procedure: CYSTOSCOPY/URETEROSCOPY/HOLMIUM LASER/STENT PLACEMENT;  Surgeon:  [name], MD;  Location: [facility] ORS;  Service: Urology;  Laterality: Left;  . LITHOTRIPSY    . URETEROSCOPY WITH HOLMIUM LASER LITHOTRIPSY Left 04/22/2015   Procedure: URETEROSCOPY WITH HOLMIUM LASER LITHOTRIPSY;  Surgeon: Collier Flowers, MD;  Location: [facility] ORS;  Service: Urology;  Laterality: Left;    Prior to Admission medications   Medication Sig Start Date End Date Taking? Authorizing Provider  acetaminophen (TYLENOL) 500 MG tablet Take 500 mg by mouth every 6 (six) hours as needed.   Yes [provider]  naproxen (NAPROSYN) 500 MG tablet Take 500 mg by mouth 2 (two) times daily with a meal.   Yes [provider]  albuterol (PROVENTIL HFA;VENTOLIN HFA) 108 (90 Base) MCG/ACT inhaler Inhale 2 puffs into the lungs every 6 (six) hours as needed for wheezing or shortness of breath. Patient not taking: Reported on 02/02/2019 12/03/18   Versie Starks, PA-C  amoxicillin (AMOXIL) 500 MG capsule Take 1 capsule (500 mg total) by mouth 3 (three) times daily. Patient not taking: Reported on 02/02/2019 12/03/18   Versie Starks, PA-C  buPROPion [facility]) 75 MG tablet Take 2 tablets (150 mg total) by mouth 2 (two) times daily. Patient not taking: Reported on 02/02/2019 04/11/18   [name], MD  cyclobenzaprine (FLEXERIL) 10 MG tablet Take 1 tablet (10 mg total) by mouth 3 (three) times daily as needed. Patient not taking: Reported on 02/02/2019 12/03/18   Versie Starks, PA-C    Allergies Patient has no known allergies.  Family History  Problem Relation Age of Onset  . Cancer Mother 54       breast  . Asthma Mother   . Arthritis Mother   . Cancer Maternal Grandmother 2       breast, colon (pre-cancerous polyps)  . Anemia Maternal Grandmother   . Diabetes Paternal Grandmother   . Stroke Paternal Grandmother   . Anemia Sister   . Urinary tract infection Sister   . Anemia Daughter   . Heart disease Maternal Grandfather   . COPD Paternal Grandfather   . Anemia Sister    . Urinary tract infection Sister     Social History Social History   Tobacco Use  . Smoking status: Current Every Day Smoker    Packs/day: 1.00    Years: 23.00    Pack years: 23.00    Types: Cigarettes  . Smokeless tobacco: Never Used  Substance Use Topics  . Alcohol use: Yes    Alcohol/week: 4.0 standard drinks    Types: 4 Shots of liquor per week  . Drug use: Not Currently    Types: Marijuana    Comment: patient no longer smoking    Review of Systems  Constitutional: No fever/chills Eyes: No visual changes. ENT: No sore throat. Cardiovascular: Denies chest pain. Respiratory: Denies shortness of breath. Gastrointestinal: No abdominal pain.  No nausea, no vomiting.  No diarrhea.  No constipation. Genitourinary: Negative for dysuria. Musculoskeletal:  back pain. Skin: Negative for rash. Neurological: Negative for headaches, focal weakness    ____________________________________________   PHYSICAL EXAM:  VITAL SIGNS: ED Triage Vitals  Enc Vitals Group     BP 02/02/19 1610 (!) 115/96     Pulse Rate 02/02/19 1610 (!) 113     Resp 02/02/19 1610 16     Temp 02/02/19 1610 100.3 F (37.9 C)     Temp Source 02/02/19 1610 Oral     SpO2 02/02/19 1610 99 %     Weight 02/02/19 1614 160 lb (72.6 kg)     Height 02/02/19 1614 5\' 2"  (1.575 m)     Head Circumference --      Peak Flow --      Pain Score 02/02/19 1613 9     Pain Loc --      Pain Edu? --      Excl. in [place]? --     Constitutional: Alert and oriented. Well appearing and in no acute distress. Eyes: Conjunctivae are normal. Head: Atraumatic. Nose: No congestion/rhinnorhea. Mouth/Throat: Mucous membranes are moist.  Oropharynx non-erythematous. Neck: No stridor. Cardiovascular: Normal rate, regular rhythm. Grossly normal heart sounds.  Good peripheral circulation. Respiratory: Normal respiratory effort.  No retractions. Lungs CTAB. Gastrointestinal: Soft and nontender. No distention. No abdominal bruits.   Oral CVA tenderness. Musculoskeletal: No lower extremity tenderness nor edema.  . Neurologic:  Normal speech and language. No gross focal neurologic deficits are appreciated. No gait instability. Skin:  Skin is warm, dry and intact. No rash noted. Psychiatric: Mood and affect are normal. Speech and behavior are normal.  ____________________________________________   LABS (all labs ordered are listed, but only abnormal results are displayed)  Labs Reviewed  URINALYSIS, COMPLETE (UACMP) WITH MICROSCOPIC - Abnormal; Notable for the following components:      Result Value   Color, Urine YELLOW (*)    APPearance HAZY (*)    Hgb urine dipstick SMALL (*)    Protein, ur 100 (*)    Leukocytes,Ua SMALL (*)    Bacteria, UA RARE (*)  All other components within normal limits  CBC WITH DIFFERENTIAL/PLATELET - Abnormal; Notable for the following components:   WBC 21.4 (*)    MCH 34.1 (*)    Neutro Abs 18.8 (*)    Monocytes Absolute 1.1 (*)    Abs Immature Granulocytes 0.19 (*)    All other components within normal limits  BASIC METABOLIC PANEL - Abnormal; Notable for the following components:   Potassium 3.4 (*)    Glucose, Bld 151 (*)    All other components within normal limits  SARS CORONAVIRUS 2 (HOSPITAL ORDER, [place])  CULTURE, BLOOD (ROUTINE X 2)  CULTURE, BLOOD (ROUTINE X 2)  CULTURE, BLOOD (ROUTINE X 2)  CULTURE, BLOOD (ROUTINE X 2)  URINE CULTURE  URINE CULTURE  LACTIC ACID, PLASMA  HEPATIC FUNCTION PANEL  PREGNANCY, URINE  HIV ANTIBODY (ROUTINE TESTING W REFLEX)  CBC  URINALYSIS, ROUTINE W REFLEX MICROSCOPIC  CBC  PROTIME-INR  BASIC METABOLIC PANEL  CBC WITH DIFFERENTIAL/PLATELET  COMPREHENSIVE METABOLIC PANEL  LACTIC ACID, PLASMA  LACTIC ACID, PLASMA  PROCALCITONIN  PROTIME-INR  APTT   ____________________________________________  EKG  EKG read interpreted by me shows normal sinus rhythm rate of 85 normal axis no acute ST-T  changes there is possible left atrial enlargement per computer I agree ____________________________________________  RADIOLOGY  ED MD interpretation:   Official radiology report(s): Dg Chest Port 1 View  Result Date: 02/02/2019 CLINICAL DATA:  48 year old female with sepsis. EXAM: PORTABLE CHEST 1 VIEW COMPARISON:  Chest radiograph dated 02/02/2019 FINDINGS: The heart size and mediastinal contours are within normal limits. Both lungs are clear. The visualized skeletal structures are unremarkable. IMPRESSION: No active disease. Electronically Signed   By: Anner Crete M.D.   On: 02/02/2019 23:05   Dg Chest Portable 1 View  Result Date: 02/02/2019 CLINICAL DATA:  Body swelling EXAM: PORTABLE CHEST 1 VIEW COMPARISON:  10/22/2016 FINDINGS: The heart size and mediastinal contours are within normal limits. Both lungs are clear. The visualized skeletal structures are unremarkable. IMPRESSION: No acute abnormality of the lungs in AP portable projection. Electronically Signed   By: Eddie Candle M.D.   On: 02/02/2019 19:01   Ct Renal Stone Study  Result Date: 02/02/2019 CLINICAL DATA:  Body swelling EXAM: CT ABDOMEN AND PELVIS WITHOUT CONTRAST TECHNIQUE: Multidetector CT imaging of the abdomen and pelvis was performed following the standard protocol without IV contrast. COMPARISON:  10/28/2017 FINDINGS: Lower chest: No acute abnormality. Hepatobiliary: No solid liver abnormality is seen. No gallstones, gallbladder wall thickening, or biliary dilatation. Pancreas: Unremarkable. No pancreatic ductal dilatation or surrounding inflammatory changes. Spleen: Normal in size without significant abnormality. Adrenals/Urinary Tract: Adrenal glands are unremarkable. There is fat stranding about the right kidney and ureter without hydronephrosis. A previously seen inferior pole calculus is no longer noted and presumably passed. There is a punctuate, nonobstructive calculus of the superior pole. There are multiple  nonobstructive left renal calculi. Bladder is unremarkable. Stomach/Bowel: Stomach is within normal limits. Appendix appears normal. No evidence of bowel wall thickening, distention, or inflammatory changes. Vascular/Lymphatic: No significant vascular findings are present. No enlarged abdominal or pelvic lymph nodes. Reproductive: No mass or other significant abnormality. Other: No abdominal wall hernia or abnormality. Small volume nonspecific free fluid in the low pelvis. Musculoskeletal: No acute or significant osseous findings. IMPRESSION: 1. There is fat stranding about the right kidney and ureter without hydronephrosis. A previously seen inferior pole calculus is no longer noted and presumably passed. There is a punctuate, nonobstructive  calculus of the superior pole. Findings are consistent with pyelonephritis and/or recently passed calculus. There are multiple nonobstructive left renal calculi. 2. Small volume nonspecific free fluid in the low pelvis, which may be functional or reactive. Electronically Signed   By: Eddie Candle M.D.   On: 02/02/2019 19:45    ____________________________________________   PROCEDURES  Procedure(s) performed (including Critical Care):  Procedures   ____________________________________________   INITIAL IMPRESSION / ASSESSMENT AND PLAN / ED COURSE  Brittany Herrera was evaluated in Emergency Department on 02/02/2019 for the symptoms described in the history of present illness. She was evaluated in the context of the global COVID-19 pandemic, which necessitated consideration that the patient might be at risk for infection with the SARS-CoV-2 virus that causes COVID-19. Institutional protocols and algorithms that pertain to the evaluation of patients at risk for COVID-19 are in a state of rapid change based on information released by regulatory bodies including the CDC and federal and state organizations. These policies and algorithms were followed during the  patient's care in the ED.    Patient is tachycardic febrile and has a high white count.  She is criteria for sepsis.  We will get her in the hospital for some IV antibiotics.         ____________________________________________   FINAL CLINICAL IMPRESSION(S) / ED DIAGNOSES  Final diagnoses:  Sepsis, due to unspecified organism, unspecified whether acute organ dysfunction present [facility])  Pyelonephritis     ED Discharge Orders    None       Note:  This document was prepared using Dragon voice recognition software and may include unintentional dictation errors.    Nena Polio, MD 02/02/19 3295    Nena Polio, MD 02/11/19 [phone]

## 2019-02-02 NOTE — ED Notes (Signed)
Patient states that she had a tooth extracted 4 days ago. She believes that her illness may be caused by the medicine given by her dentist.

## 2019-02-02 NOTE — Progress Notes (Signed)
CODE SEPSIS - PHARMACY COMMUNICATION  **Broad Spectrum Antibiotics should be administered within 1 hour of Sepsis diagnosis**  Time Code Sepsis Called/Page Received: 1758  Antibiotics Ordered: Cefepime  Time of 1st antibiotic administration: 1840  Additional action taken by pharmacy: nothing to note  If necessary, Name of Provider/Nurse Contacted: N/A    Pearla Dubonnet ,PharmD Clinical Pharmacist  02/02/2019  6:47 PM

## 2019-02-03 ENCOUNTER — Other Ambulatory Visit: Payer: Self-pay

## 2019-02-03 DIAGNOSIS — N10 Acute tubulo-interstitial nephritis: Secondary | ICD-10-CM | POA: Diagnosis present

## 2019-02-03 LAB — BLOOD CULTURE ID PANEL (REFLEXED)

## 2019-02-03 LAB — CBC WITH DIFFERENTIAL/PLATELET
Abs Immature Granulocytes: 0.11 10*3/uL — ABNORMAL HIGH (ref 0.00–0.07)
Basophils Absolute: 0.1 10*3/uL (ref 0.0–0.1)
Basophils Relative: 0 %
Eosinophils Absolute: 0 10*3/uL (ref 0.0–0.5)
Eosinophils Relative: 0 %
HCT: 37.7 % (ref 36.0–46.0)
Hemoglobin: 13 g/dL (ref 12.0–15.0)
Immature Granulocytes: 1 %
Lymphocytes Relative: 5 %
Lymphs Abs: 0.9 10*3/uL (ref 0.7–4.0)
MCH: 34 pg (ref 26.0–34.0)
MCHC: 34.5 g/dL (ref 30.0–36.0)
MCV: 98.7 fL (ref 80.0–100.0)
Monocytes Absolute: 1 10*3/uL (ref 0.1–1.0)
Monocytes Relative: 5 %
Neutro Abs: 16.1 10*3/uL — ABNORMAL HIGH (ref 1.7–7.7)
Neutrophils Relative %: 89 %
Platelets: 187 10*3/uL (ref 150–400)
RBC: 3.82 MIL/uL — ABNORMAL LOW (ref 3.87–5.11)
RDW: 13.9 % (ref 11.5–15.5)
WBC: 18.2 10*3/uL — ABNORMAL HIGH (ref 4.0–10.5)
nRBC: 0 % (ref 0.0–0.2)

## 2019-02-03 LAB — COMPREHENSIVE METABOLIC PANEL
ALT: 16 U/L (ref 0–44)
AST: 19 U/L (ref 15–41)
Albumin: 3.3 g/dL — ABNORMAL LOW (ref 3.5–5.0)
Alkaline Phosphatase: 76 U/L (ref 38–126)
Anion gap: 8 (ref 5–15)
BUN: 9 mg/dL (ref 6–20)
CO2: 21 mmol/L — ABNORMAL LOW (ref 22–32)
Calcium: 8.4 mg/dL — ABNORMAL LOW (ref 8.9–10.3)
Chloride: 108 mmol/L (ref 98–111)
Creatinine, Ser: 0.86 mg/dL (ref 0.44–1.00)
GFR calc Af Amer: >60 mL/min (ref >60)
GFR calc non Af Amer: >60 mL/min (ref >60)
Glucose, Bld: 183 mg/dL — ABNORMAL HIGH (ref 70–99)
Potassium: 3 mmol/L — ABNORMAL LOW (ref 3.5–5.1)
Sodium: 137 mmol/L (ref 135–145)
Total Bilirubin: 0.8 mg/dL (ref 0.3–1.2)
Total Protein: 6.3 g/dL — ABNORMAL LOW (ref 6.5–8.1)

## 2019-02-03 LAB — PROCALCITONIN: Procalcitonin: 0.33 ng/mL

## 2019-02-03 LAB — CBC
HCT: 37 % (ref 36.0–46.0)
Hemoglobin: 12.8 g/dL (ref 12.0–15.0)
MCH: 34.2 pg — ABNORMAL HIGH (ref 26.0–34.0)
MCHC: 34.6 g/dL (ref 30.0–36.0)
MCV: 98.9 fL (ref 80.0–100.0)
Platelets: 179 10*3/uL (ref 150–400)
RBC: 3.74 MIL/uL — ABNORMAL LOW (ref 3.87–5.11)
RDW: 14 % (ref 11.5–15.5)
WBC: 16.1 10*3/uL — ABNORMAL HIGH (ref 4.0–10.5)
nRBC: 0 % (ref 0.0–0.2)

## 2019-02-03 LAB — LACTIC ACID, PLASMA: Lactic Acid, Venous: 1.2 mmol/L (ref 0.5–1.9)

## 2019-02-03 LAB — BASIC METABOLIC PANEL
Anion gap: 7 (ref 5–15)
BUN: 10 mg/dL (ref 6–20)
CO2: 22 mmol/L (ref 22–32)
Calcium: 8.5 mg/dL — ABNORMAL LOW (ref 8.9–10.3)
Chloride: 109 mmol/L (ref 98–111)
Creatinine, Ser: 0.85 mg/dL (ref 0.44–1.00)
GFR calc Af Amer: >60 mL/min (ref >60)
GFR calc non Af Amer: >60 mL/min (ref >60)
Glucose, Bld: 183 mg/dL — ABNORMAL HIGH (ref 70–99)
Potassium: 3 mmol/L — ABNORMAL LOW (ref 3.5–5.1)
Sodium: 138 mmol/L (ref 135–145)

## 2019-02-03 LAB — PROTIME-INR
INR: 1.2 (ref 0.8–1.2)
INR: 1.2 (ref 0.8–1.2)
Prothrombin Time: 14.6 seconds (ref 11.4–15.2)
Prothrombin Time: 14.8 seconds (ref 11.4–15.2)

## 2019-02-03 LAB — APTT: aPTT: 32 seconds (ref 24–36)

## 2019-02-03 MED ORDER — ACETAMINOPHEN 325 MG PO TABS
650.0000 mg | ORAL_TABLET | Freq: Four times a day (QID) | ORAL | Status: DC | PRN
  Administered 2019-02-03: 16:00:00 650 mg via ORAL
  Filled 2019-02-03: qty 2

## 2019-02-03 MED ORDER — PNEUMOCOCCAL VAC POLYVALENT 25 MCG/0.5ML IJ INJ: 0.5000 mL | INJECTION | INTRAMUSCULAR | Status: DC

## 2019-02-03 MED ORDER — POTASSIUM CHLORIDE 20 MEQ PO PACK
40.0000 meq | PACK | Freq: Once | ORAL | Status: AC
  Administered 2019-02-03: 40 meq via ORAL
  Filled 2019-02-03: qty 2

## 2019-02-03 MED ORDER — VITAMIN B-1 100 MG PO TABS: 100.0000 mg | ORAL_TABLET | Freq: Every day | ORAL | Status: DC

## 2019-02-03 MED ORDER — SODIUM CHLORIDE 0.9 % IV SOLN
2.0000 g | Freq: Two times a day (BID) | INTRAVENOUS | Status: DC
  Administered 2019-02-03 – 2019-02-04 (×2): 2 g via INTRAVENOUS
  Filled 2019-02-03 (×3): qty 2

## 2019-02-03 NOTE — Progress Notes (Signed)
PHARMACY - PHYSICIAN COMMUNICATION CRITICAL VALUE ALERT - BLOOD CULTURE IDENTIFICATION (BCID)  Brittany Herrera is an 48 y.o. female who presented to [facility] on 02/02/2019 with a chief complaint of pyelonephritis  Name of physician (or Provider) Contacted: [place]   Current antibiotics: cefepime   Changes to prescribed antibiotics recommended:  Resume Cefepime   Results for orders placed or performed during the hospital encounter of 02/02/19  Blood Culture ID Panel (Reflexed) (Collected: 02/02/2019  5:52 PM)  Result Value Ref Range   Enterococcus species NOT DETECTED NOT DETECTED   Listeria monocytogenes NOT DETECTED NOT DETECTED   Staphylococcus species NOT DETECTED NOT DETECTED   Staphylococcus aureus (BCID) NOT DETECTED NOT DETECTED   Streptococcus species NOT DETECTED NOT DETECTED   Streptococcus agalactiae NOT DETECTED NOT DETECTED   Streptococcus pneumoniae NOT DETECTED NOT DETECTED   Streptococcus pyogenes NOT DETECTED NOT DETECTED   Acinetobacter baumannii NOT DETECTED NOT DETECTED   Enterobacteriaceae species DETECTED (A) NOT DETECTED   Enterobacter cloacae complex NOT DETECTED NOT DETECTED   Escherichia coli NOT DETECTED NOT DETECTED   Klebsiella oxytoca NOT DETECTED NOT DETECTED   Klebsiella pneumoniae NOT DETECTED NOT DETECTED   Proteus species NOT DETECTED NOT DETECTED   Serratia marcescens NOT DETECTED NOT DETECTED   Carbapenem resistance NOT DETECTED NOT DETECTED   Haemophilus influenzae NOT DETECTED NOT DETECTED   Neisseria meningitidis NOT DETECTED NOT DETECTED   Pseudomonas aeruginosa NOT DETECTED NOT DETECTED   Candida albicans NOT DETECTED NOT DETECTED   Candida glabrata NOT DETECTED NOT DETECTED   Candida krusei NOT DETECTED NOT DETECTED   Candida parapsilosis NOT DETECTED NOT DETECTED   Candida tropicalis NOT DETECTED NOT DETECTED    Simpson,Michael L 02/03/2019  1:01 PM

## 2019-02-03 NOTE — Progress Notes (Signed)
[facility] [place] NAME: Brittany Herrera    MR#:  [id number]  DATE OF BIRTH:  06-06-1971  SUBJECTIVE:  CHIEF COMPLAINT:   Chief Complaint  Patient presents with  . Dysuria   Had some hematuria earlier in the day.  Bilateral flank pain.  Afebrile today.  Temperature 99.6.  REVIEW OF SYSTEMS:    Review of Systems  Constitutional: Positive for malaise/fatigue. Negative for chills and fever.  HENT: Negative for sore throat.   Eyes: Negative for blurred vision, double vision and pain.  Respiratory: Negative for cough, hemoptysis, shortness of breath and wheezing.   Cardiovascular: Negative for chest pain, palpitations, orthopnea and leg swelling.  Gastrointestinal: Positive for abdominal pain. Negative for constipation, diarrhea, heartburn, nausea and vomiting.  Genitourinary: Positive for hematuria. Negative for dysuria.  Musculoskeletal: Negative for back pain and joint pain.  Skin: Negative for rash.  Neurological: Negative for sensory change, speech change, focal weakness and headaches.  Endo/Heme/Allergies: Does not bruise/bleed easily.  Psychiatric/Behavioral: Negative for depression. The patient is not nervous/anxious.     DRUG ALLERGIES:  No Known Allergies  VITALS:  Blood pressure 128/81, pulse 90, temperature 99.6 F (37.6 C), temperature source Oral, resp. rate 16, height 5\' 2"  (1.575 m), weight 72.6 kg, SpO2 97 %.  PHYSICAL EXAMINATION:   Physical Exam  GENERAL:  48 y.o.-year-old patient lying in the bed with no acute distress.  EYES: Pupils equal, round, reactive to light and accommodation. No scleral icterus. Extraocular muscles intact.  HEENT: Head atraumatic, normocephalic. Oropharynx and nasopharynx clear.  NECK:  Supple, no jugular venous distention. No thyroid enlargement, no tenderness.  LUNGS: Normal breath sounds bilaterally, no wheezing, rales, rhonchi. No use of accessory muscles of respiration.   CARDIOVASCULAR: S1, S2 normal. No murmurs, rubs, or gallops.  ABDOMEN: Soft, nontender, nondistended. Bowel sounds present. No organomegaly or mass.  EXTREMITIES: No cyanosis, clubbing or edema b/l.    NEUROLOGIC: Cranial nerves II through XII are intact. No focal Motor or sensory deficits b/l.   PSYCHIATRIC: The patient is alert and oriented x 3.  SKIN: No obvious rash, lesion, or ulcer.   LABORATORY PANEL:   CBC Recent Labs  Lab 02/03/19 0536  WBC 16.1*  HGB 12.8  HCT 37.0  PLT 179   ------------------------------------------------------------------------------------------------------------------ Chemistries  Recent Labs  Lab 02/03/19 0043  NA 137  K 3.0*  CL 108  CO2 21*  GLUCOSE 183*  BUN 9  CREATININE 0.86  CALCIUM 8.4*  AST 19  ALT 16  ALKPHOS 76  BILITOT 0.8   ------------------------------------------------------------------------------------------------------------------  Cardiac Enzymes No results for input(s): TROPONINI in the last 168 hours. ------------------------------------------------------------------------------------------------------------------  RADIOLOGY:  Dg Chest Port 1 View  Result Date: 02/02/2019 CLINICAL DATA:  48 year old female with sepsis. EXAM: PORTABLE CHEST 1 VIEW COMPARISON:  Chest radiograph dated 02/02/2019 FINDINGS: The heart size and mediastinal contours are within normal limits. Both lungs are clear. The visualized skeletal structures are unremarkable. IMPRESSION: No active disease. Electronically Signed   By: Anner Crete M.D.   On: 02/02/2019 23:05   Dg Chest Portable 1 View  Result Date: 02/02/2019 CLINICAL DATA:  Body swelling EXAM: PORTABLE CHEST 1 VIEW COMPARISON:  10/22/2016 FINDINGS: The heart size and mediastinal contours are within normal limits. Both lungs are clear. The visualized skeletal structures are unremarkable. IMPRESSION: No acute abnormality of the lungs in AP portable projection. Electronically Signed    By: Eddie Candle M.D.   On: 02/02/2019 19:01  Ct Renal Stone Study  Result Date: 02/02/2019 CLINICAL DATA:  Body swelling EXAM: CT ABDOMEN AND PELVIS WITHOUT CONTRAST TECHNIQUE: Multidetector CT imaging of the abdomen and pelvis was performed following the standard protocol without IV contrast. COMPARISON:  10/28/2017 FINDINGS: Lower chest: No acute abnormality. Hepatobiliary: No solid liver abnormality is seen. No gallstones, gallbladder wall thickening, or biliary dilatation. Pancreas: Unremarkable. No pancreatic ductal dilatation or surrounding inflammatory changes. Spleen: Normal in size without significant abnormality. Adrenals/Urinary Tract: Adrenal glands are unremarkable. There is fat stranding about the right kidney and ureter without hydronephrosis. A previously seen inferior pole calculus is no longer noted and presumably passed. There is a punctuate, nonobstructive calculus of the superior pole. There are multiple nonobstructive left renal calculi. Bladder is unremarkable. Stomach/Bowel: Stomach is within normal limits. Appendix appears normal. No evidence of bowel wall thickening, distention, or inflammatory changes. Vascular/Lymphatic: No significant vascular findings are present. No enlarged abdominal or pelvic lymph nodes. Reproductive: No mass or other significant abnormality. Other: No abdominal wall hernia or abnormality. Small volume nonspecific free fluid in the low pelvis. Musculoskeletal: No acute or significant osseous findings. IMPRESSION: 1. There is fat stranding about the right kidney and ureter without hydronephrosis. A previously seen inferior pole calculus is no longer noted and presumably passed. There is a punctuate, nonobstructive calculus of the superior pole. Findings are consistent with pyelonephritis and/or recently passed calculus. There are multiple nonobstructive left renal calculi. 2. Small volume nonspecific free fluid in the low pelvis, which may be functional or  reactive. Electronically Signed   By: Eddie Candle M.D.   On: 02/02/2019 19:45     ASSESSMENT AND PLAN:   *Sepsis secondary to pyelonephritis. Change IV cefepime to ceftriaxone.  Waiting on blood and urine cultures. Pain management PRN  History of nephrolithiasis.  No obstructing stones this admission.  *Hypokalemia.  Replace orally.  DVT prophylaxis with Lovenox   All the records are reviewed and case discussed with Care Management/Social Worker. Management plans discussed with the patient, family and they are in agreement.  CODE STATUS: Full code  DVT Prophylaxis: SCDs  TOTAL TIME TAKING CARE OF THIS PATIENT: 35 minutes.   POSSIBLE D/C IN 1-2 DAYS, DEPENDING ON CLINICAL CONDITION.  Leia Alf Sanjiv Castorena M.D on 02/03/2019 at 11:58 AM  Between 7am to 6pm - Pager - [phone]  After 6pm go to [email or web address] - password [email or web address] [facility]  Office  [phone]  CC: Primary care physician; [name], MD  Note: This dictation was prepared with Dragon dictation along with smaller phrase technology. Any transcriptional errors that result from this process are unintentional.

## 2019-02-03 NOTE — Progress Notes (Signed)
PHARMACIST - PHYSICIAN COMMUNICATION  CONCERNING: IV to Oral Route Change Policy  RECOMMENDATION: This patient is receiving thiamine by the intravenous route.  Based on criteria approved by the Pharmacy and Therapeutics Committee, the intravenous medication(s) is/are being converted to the equivalent oral dose form(s).   DESCRIPTION: These criteria include:  The patient is eating (either orally or via tube) and/or has been taking other orally administered medications for a least 24 hours  The patient has no evidence of active gastrointestinal bleeding or impaired GI absorption (gastrectomy, short bowel, patient on TNA or NPO).  If you have questions about this conversion, please contact the Pharmacy Department      [phone] )  [place], [facility] 02/03/2019 11:05 AM

## 2019-02-04 LAB — CBC WITH DIFFERENTIAL/PLATELET
Abs Immature Granulocytes: 0.05 10*3/uL (ref 0.00–0.07)
Basophils Absolute: 0 10*3/uL (ref 0.0–0.1)
Basophils Relative: 1 %
Eosinophils Absolute: 0.1 10*3/uL (ref 0.0–0.5)
Eosinophils Relative: 2 %
HCT: 35.6 % — ABNORMAL LOW (ref 36.0–46.0)
Hemoglobin: 12.3 g/dL (ref 12.0–15.0)
Immature Granulocytes: 1 %
Lymphocytes Relative: 16 %
Lymphs Abs: 1.3 10*3/uL (ref 0.7–4.0)
MCH: 34 pg (ref 26.0–34.0)
MCHC: 34.6 g/dL (ref 30.0–36.0)
MCV: 98.3 fL (ref 80.0–100.0)
Monocytes Absolute: 0.6 10*3/uL (ref 0.1–1.0)
Monocytes Relative: 7 %
Neutro Abs: 6.3 10*3/uL (ref 1.7–7.7)
Neutrophils Relative %: 73 %
Platelets: 171 10*3/uL (ref 150–400)
RBC: 3.62 MIL/uL — ABNORMAL LOW (ref 3.87–5.11)
RDW: 13.8 % (ref 11.5–15.5)
WBC: 8.5 10*3/uL (ref 4.0–10.5)
nRBC: 0 % (ref 0.0–0.2)

## 2019-02-04 LAB — BASIC METABOLIC PANEL
Anion gap: 7 (ref 5–15)
BUN: 9 mg/dL (ref 6–20)
CO2: 22 mmol/L (ref 22–32)
Calcium: 8.4 mg/dL — ABNORMAL LOW (ref 8.9–10.3)
Chloride: 110 mmol/L (ref 98–111)
Creatinine, Ser: 0.76 mg/dL (ref 0.44–1.00)
GFR calc Af Amer: >60 mL/min (ref >60)
GFR calc non Af Amer: >60 mL/min (ref >60)
Glucose, Bld: 115 mg/dL — ABNORMAL HIGH (ref 70–99)
Potassium: 3.5 mmol/L (ref 3.5–5.1)
Sodium: 139 mmol/L (ref 135–145)

## 2019-02-04 LAB — HIV ANTIBODY (ROUTINE TESTING W REFLEX): HIV Screen 4th Generation wRfx: NONREACTIVE

## 2019-02-04 MED ORDER — SODIUM CHLORIDE 0.9 % IV SOLN
2.0000 g | Freq: Three times a day (TID) | INTRAVENOUS | Status: DC
  Administered 2019-02-04 – 2019-02-05 (×3): 2 g via INTRAVENOUS
  Filled 2019-02-04 (×5): qty 2

## 2019-02-04 NOTE — Progress Notes (Signed)
[facility] [place] NAME: Brittany Herrera    MR#:  [id number]  DATE OF BIRTH:  1970/12/13  SUBJECTIVE:   doing well no dysuria or back pain  REVIEW OF SYSTEMS:    Review of Systems  Constitutional: Negative for fever, chills weight loss HENT: Negative for ear pain, nosebleeds, congestion, facial swelling, rhinorrhea, neck pain, neck stiffness and ear discharge.   Respiratory: Negative for cough, shortness of breath, wheezing  Cardiovascular: Negative for chest pain, palpitations and leg swelling.  Gastrointestinal: Negative for heartburn, abdominal pain, vomiting, diarrhea or consitpation Genitourinary: Negative for dysuria, urgency, frequency, hematuria Musculoskeletal: Negative for back pain or joint pain Neurological: Negative for dizziness, seizures, syncope, focal weakness,  numbness and headaches.  Hematological: Does not bruise/bleed easily.  Psychiatric/Behavioral: Negative for hallucinations, confusion, dysphoric mood    Tolerating Diet: yes      DRUG ALLERGIES:  No Known Allergies  VITALS:  Blood pressure (!) 142/98, pulse 81, temperature 97.9 F (36.6 C), temperature source Oral, resp. rate 14, height 5\' 2"  (1.575 m), weight 72.6 kg, SpO2 99 %.  PHYSICAL EXAMINATION:  Constitutional: Appears well-developed and well-nourished. No distress. HENT: Normocephalic. Marland Kitchen Oropharynx is clear and moist.  Eyes: Conjunctivae and EOM are normal. PERRLA, no scleral icterus.  Neck: Normal ROM. Neck supple. No JVD. No tracheal deviation. CVS: RRR, S1/S2 +, no murmurs, no gallops, no carotid bruit.  Pulmonary: Effort and breath sounds normal, no stridor, rhonchi, wheezes, rales.  Abdominal: Soft. BS +,  no distension, tenderness, rebound or guarding.  Musculoskeletal: Normal range of motion. No edema and no tenderness.  Neuro: Alert. CN 2-12 grossly intact. No focal deficits. Skin: Skin is warm and dry. No rash noted. Psychiatric:  Normal mood and affect.      LABORATORY PANEL:   CBC Recent Labs  Lab 02/04/19 0439  WBC 8.5  HGB 12.3  HCT 35.6*  PLT 171   ------------------------------------------------------------------------------------------------------------------  Chemistries  Recent Labs  Lab 02/03/19 0043 02/04/19 0439  NA 137 139  K 3.0* 3.5  CL 108 110  CO2 21* 22  GLUCOSE 183* 115*  BUN 9 9  CREATININE 0.86 0.76  CALCIUM 8.4* 8.4*  AST 19  --   ALT 16  --   ALKPHOS 76  --   BILITOT 0.8  --    ------------------------------------------------------------------------------------------------------------------  Cardiac Enzymes No results for input(s): TROPONINI in the last 168 hours. ------------------------------------------------------------------------------------------------------------------  RADIOLOGY:  Dg Chest Port 1 View  Result Date: 02/02/2019 CLINICAL DATA:  48 year old female with sepsis. EXAM: PORTABLE CHEST 1 VIEW COMPARISON:  Chest radiograph dated 02/02/2019 FINDINGS: The heart size and mediastinal contours are within normal limits. Both lungs are clear. The visualized skeletal structures are unremarkable. IMPRESSION: No active disease. Electronically Signed   By: Anner Crete M.D.   On: 02/02/2019 23:05   Dg Chest Portable 1 View  Result Date: 02/02/2019 CLINICAL DATA:  Body swelling EXAM: PORTABLE CHEST 1 VIEW COMPARISON:  10/22/2016 FINDINGS: The heart size and mediastinal contours are within normal limits. Both lungs are clear. The visualized skeletal structures are unremarkable. IMPRESSION: No acute abnormality of the lungs in AP portable projection. Electronically Signed   By: Eddie Candle M.D.   On: 02/02/2019 19:01   Ct Renal Stone Study  Result Date: 02/02/2019 CLINICAL DATA:  Body swelling EXAM: CT ABDOMEN AND PELVIS WITHOUT CONTRAST TECHNIQUE: Multidetector CT imaging of the abdomen and pelvis was performed following the standard protocol without IV contrast.  COMPARISON:  10/28/2017 FINDINGS: Lower chest: No acute abnormality. Hepatobiliary: No solid liver abnormality is seen. No gallstones, gallbladder wall thickening, or biliary dilatation. Pancreas: Unremarkable. No pancreatic ductal dilatation or surrounding inflammatory changes. Spleen: Normal in size without significant abnormality. Adrenals/Urinary Tract: Adrenal glands are unremarkable. There is fat stranding about the right kidney and ureter without hydronephrosis. A previously seen inferior pole calculus is no longer noted and presumably passed. There is a punctuate, nonobstructive calculus of the superior pole. There are multiple nonobstructive left renal calculi. Bladder is unremarkable. Stomach/Bowel: Stomach is within normal limits. Appendix appears normal. No evidence of bowel wall thickening, distention, or inflammatory changes. Vascular/Lymphatic: No significant vascular findings are present. No enlarged abdominal or pelvic lymph nodes. Reproductive: No mass or other significant abnormality. Other: No abdominal wall hernia or abnormality. Small volume nonspecific free fluid in the low pelvis. Musculoskeletal: No acute or significant osseous findings. IMPRESSION: 1. There is fat stranding about the right kidney and ureter without hydronephrosis. A previously seen inferior pole calculus is no longer noted and presumably passed. There is a punctuate, nonobstructive calculus of the superior pole. Findings are consistent with pyelonephritis and/or recently passed calculus. There are multiple nonobstructive left renal calculi. 2. Small volume nonspecific free fluid in the low pelvis, which may be functional or reactive. Electronically Signed   By: Eddie Candle M.D.   On: 02/02/2019 19:45     ASSESSMENT AND PLAN:   48 year old female with history of kidney stones who presents to the emergency room due to lower back pain and fever.  1.  Gram-negative rod bacteremia/sepsis: Patient presented with fever  and elevated white blood cell count.  Sepsis is from gram-negative rod bacteremia from underlying pyelonephritis.  Sepsis is resolved.  2.  Gram-negative rod bacteremia from acute pyelonephritis: Follow-up on final blood culture and sensitivities. Continue cefepime for now.  3.  Hypokalemia: This has been repleted. 4. Tobacco dependence: Patient is encouraged to quit smoking and willing to attempt to quit was assessed. Patient highly motivated.Counseling was provided for 4 minutes.   Management plans discussed with the patient and she is in agreement.  CODE STATUS: full  TOTAL TIME TAKING CARE OF THIS PATIENT: 26 minutes.     POSSIBLE D/C tomorrow, DEPENDING ON CLINICAL CONDITION.   Bettey Costa M.D on 02/04/2019 at 12:28 PM  Between 7am to 6pm - Pager - [phone] After 6pm go to [email or web address] - password [email or web address] [facility]  Office  [phone]  CC: Primary care physician; [name], MD  Note: This dictation was prepared with Dragon dictation along with smaller phrase technology. Any transcriptional errors that result from this process are unintentional.

## 2019-02-04 NOTE — Progress Notes (Signed)
Antimicrobial Stewardship - Brief Note  Blood cx with enterobacteriaceae species (BCID unable to determine specific species - as only several on cassette).  Final identification pending.  Currently on cefepime 2gm VI q12h  Plan - change cefepime to 2gm IV q8h for bacteremia as renal function is adequate   Doreene Eland, PharmD, BCPS.   Work Cell: [phone] 02/04/2019 1:29 PM

## 2019-02-05 LAB — CULTURE, BLOOD (ROUTINE X 2)
Special Requests: ADEQUATE
Special Requests: ADEQUATE

## 2019-02-05 LAB — URINE CULTURE: Culture: >=100000 — AB

## 2019-02-05 MED ORDER — CIPROFLOXACIN HCL 500 MG PO TABS
500.0000 mg | ORAL_TABLET | Freq: Two times a day (BID) | ORAL | Status: DC
  Filled 2019-02-05: qty 1

## 2019-02-05 MED ORDER — CIPROFLOXACIN HCL 750 MG PO TABS: 750.0000 mg | ORAL_TABLET | Freq: Two times a day (BID) | ORAL | 0 refills | Status: AC

## 2019-02-05 MED ORDER — CIPROFLOXACIN HCL 500 MG PO TABS
750.0000 mg | ORAL_TABLET | Freq: Two times a day (BID) | ORAL | Status: DC
  Administered 2019-02-05: 750 mg via ORAL
  Filled 2019-02-05 (×2): qty 1.5

## 2019-02-05 NOTE — Consult Note (Signed)
Pharmacy Antibiotic Note  Brittany Herrera is a 48 y.o. female admitted on 02/02/2019 with bacteremia.  Pharmacy has been consulted for ciprofloxacin dosing.  Plan: Will dose Cipro 750mg  bid for 11 days  (for a total antibiotitic therapy duration of 14 days)  Height: 5\' 2"  (157.5 cm) Weight: 160 lb (72.6 kg) IBW/kg (Calculated) : 50.1  Temp (24hrs), Avg:98.9 F (37.2 C), Min:98 F (36.7 C), Max:99.4 F (37.4 C)  Recent Labs  Lab 02/02/19 1622 02/02/19 1753 02/03/19 0041 02/03/19 0043 02/03/19 0536 02/04/19 0439  WBC 21.4*  --   --  18.2* 16.1* 8.5  CREATININE 0.88  --  0.85 0.86  --  0.76  LATICACIDVEN  --  1.3  --  1.2  --   --     Estimated Creatinine Clearance: 82 mL/min (by C-G formula based on SCr of 0.76 mg/dL).    No Known Allergies  Antimicrobials this admission: 6/6 Cefepime >> 6/9 6/9 Cipro >>   Dose adjustments this admission: None  Microbiology results: 6/6 BCx: Citrobacter Koseri (Cipro sensitive) 6/6 UCx: Citrobacter Koseri (Cipro sensitive)  Thank you for allowing pharmacy to be a part of this patient's care.  Lu Duffel, PharmD, BCPS Clinical Pharmacist 02/05/2019 8:53 AM

## 2019-02-05 NOTE — Progress Notes (Signed)
Patient has been admitted to hospital 02/02/19-02/05/19. Activity as tolerated

## 2019-02-05 NOTE — Discharge Summary (Signed)
[facility] [place] NAME: Brittany Herrera    MR#:  [id number]  DATE OF BIRTH:  Jan 24, 1971  DATE OF ADMISSION:  02/02/2019 ADMITTING PHYSICIAN: Christel Mormon, MD  DATE OF DISCHARGE: 02/05/2019 10:15 AM  PRIMARY CARE PHYSICIAN: [name], MD    ADMISSION DIAGNOSIS:  Pyelonephritis [N12] Sepsis ([place]) [A41.9] Sepsis, due to unspecified organism, unspecified whether acute organ dysfunction present ([place]) [A41.9]  DISCHARGE DIAGNOSIS:  Active Problems:   Acute pyelonephritis   SECONDARY DIAGNOSIS:   Past Medical History:  Diagnosis Date  . Abnormal Pap smear 2001   [facility]- told her that she had polyps didnt get tx  . Anemia    ONLY WITH PREGNANCY  . DUB (dysfunctional uterine bleeding)    menorrhagia, and bleeding continuously x3 yrs  . Family history of adverse reaction to anesthesia    MOM-HAS HARD TIME WAKING UP  . Kidney stones 2011, 2012  . [place] kidney stones     HOSPITAL COURSE:  48 year old female with history of kidney stones who presents to the emergency room due to lower back pain and fever.  1.  citrobacter bacteremia/sepsis: Patient presented with fever and elevated white blood cell count.  Sepsis is from gCitrobacter bacteremia from pyelonephritis.  Sepsis has resolved.  2.    Citrobacter acute pyelonephritis/kidney stones:  Patient was on broad-spectrum antibiotics.  This was narrowed down to ciprofloxacin upon discharge.  She will follow-up with her PCP with repeat urine in 2 weeks to assure clearing of underlying pyelonephritis.  She will also be followed by urology for underlying kidney stones.   3.  Hypokalemia: This has been repleted. 4. Tobacco dependence: Patient is encouraged to quit smoking and willing to attempt to quit was assessed. Patient highly motivated.Counseling was provided for 4 minutes   DISCHARGE CONDITIONS AND DIET:   Stable for discharge regular diet  CONSULTS  OBTAINED:    DRUG ALLERGIES:  No Known Allergies  DISCHARGE MEDICATIONS:   Allergies as of 02/05/2019   No Known Allergies     Medication List    STOP taking these medications   amoxicillin 500 MG capsule Commonly known as:  AMOXIL   buPROPion 75 MG tablet Commonly known as:  WELLBUTRIN   cyclobenzaprine 10 MG tablet Commonly known as:  FLEXERIL     TAKE these medications   acetaminophen 500 MG tablet Commonly known as:  TYLENOL Take 500 mg by mouth every 6 (six) hours as needed. Notes to patient:  6/9   albuterol 108 (90 Base) MCG/ACT inhaler Commonly known as:  VENTOLIN HFA Inhale 2 puffs into the lungs every 6 (six) hours as needed for wheezing or shortness of breath. Notes to patient:  6/9   ciprofloxacin 750 MG tablet Commonly known as:  CIPRO Take 1 tablet (750 mg total) by mouth 2 (two) times daily for 9 days. Notes to patient:  6/9 9pm ish   naproxen 500 MG tablet Commonly known as:  NAPROSYN Take 500 mg by mouth 2 (two) times daily with a meal. Notes to patient:  Not given in hospital         Today   CHIEF COMPLAINT:  Patient is doing well and wants to go home.   VITAL SIGNS:  Blood pressure (!) 152/92, pulse 70, temperature 98 F (36.7 C), temperature source Oral, resp. rate 19, height 5\' 2"  (1.575 m), weight 72.6 kg, SpO2 97 %.   REVIEW OF SYSTEMS:  Review of Systems  Constitutional: Negative.  Negative for chills, fever and malaise/fatigue.  HENT: Negative.  Negative for ear discharge, ear pain, hearing loss, nosebleeds and sore throat.   Eyes: Negative.  Negative for blurred vision and pain.  Respiratory: Negative.  Negative for cough, hemoptysis, shortness of breath and wheezing.   Cardiovascular: Negative.  Negative for chest pain, palpitations and leg swelling.  Gastrointestinal: Negative.  Negative for abdominal pain, blood in stool, diarrhea, nausea and vomiting.  Genitourinary: Negative.  Negative for dysuria.  Musculoskeletal:  Negative.  Negative for back pain.  Skin: Negative.   Neurological: Negative for dizziness, tremors, speech change, focal weakness, seizures and headaches.  Endo/Heme/Allergies: Negative.  Does not bruise/bleed easily.  Psychiatric/Behavioral: Negative.  Negative for depression, hallucinations and suicidal ideas.     PHYSICAL EXAMINATION:  GENERAL:  48 y.o.-year-old patient lying in the bed with no acute distress.  NECK:  Supple, no jugular venous distention. No thyroid enlargement, no tenderness.  LUNGS: Normal breath sounds bilaterally, no wheezing, rales,rhonchi  No use of accessory muscles of respiration.  CARDIOVASCULAR: S1, S2 normal. No murmurs, rubs, or gallops.  ABDOMEN: Soft, non-tender, non-distended. Bowel sounds present. No organomegaly or mass.  EXTREMITIES: No pedal edema, cyanosis, or clubbing.  PSYCHIATRIC: The patient is alert and oriented x 3.  SKIN: No obvious rash, lesion, or ulcer.   DATA REVIEW:   CBC Recent Labs  Lab 02/04/19 0439  WBC 8.5  HGB 12.3  HCT 35.6*  PLT 171    Chemistries  Recent Labs  Lab 02/03/19 0043 02/04/19 0439  NA 137 139  K 3.0* 3.5  CL 108 110  CO2 21* 22  GLUCOSE 183* 115*  BUN 9 9  CREATININE 0.86 0.76  CALCIUM 8.4* 8.4*  AST 19  --   ALT 16  --   ALKPHOS 76  --   BILITOT 0.8  --     Cardiac Enzymes No results for input(s): TROPONINI in the last 168 hours.  Microbiology Results  @MICRORSLT48 @  RADIOLOGY:  No results found.    Allergies as of 02/05/2019   No Known Allergies     Medication List    STOP taking these medications   amoxicillin 500 MG capsule Commonly known as:  AMOXIL   buPROPion 75 MG tablet Commonly known as:  WELLBUTRIN   cyclobenzaprine 10 MG tablet Commonly known as:  FLEXERIL     TAKE these medications   acetaminophen 500 MG tablet Commonly known as:  TYLENOL Take 500 mg by mouth every 6 (six) hours as needed. Notes to patient:  6/9   albuterol 108 (90 Base) MCG/ACT  inhaler Commonly known as:  VENTOLIN HFA Inhale 2 puffs into the lungs every 6 (six) hours as needed for wheezing or shortness of breath. Notes to patient:  6/9   ciprofloxacin 750 MG tablet Commonly known as:  CIPRO Take 1 tablet (750 mg total) by mouth 2 (two) times daily for 9 days. Notes to patient:  6/9 9pm ish   naproxen 500 MG tablet Commonly known as:  NAPROSYN Take 500 mg by mouth 2 (two) times daily with a meal. Notes to patient:  Not given in hospital         Management plans discussed with the patient and she is in agreement. Stable for discharge home  Patient should follow up with pcp  CODE STATUS:     Code Status Orders  (From admission, onward)         Start     Ordered  02/02/19 2155  Full code  Continuous     02/02/19 2200        Code Status History    This patient has a current code status but no historical code status.      TOTAL TIME TAKING CARE OF THIS PATIENT: 38 minutes.    Note: This dictation was prepared with Dragon dictation along with smaller phrase technology. Any transcriptional errors that result from this process are unintentional.  Bettey Costa M.D on 02/05/2019 at 11:01 AM  Between 7am to 6pm - Pager - [phone] After 6pm go to [email or web address] - password [email or web address] [facility]  Office  [phone]  CC: Primary care physician; [name], MD

## 2019-02-05 NOTE — Progress Notes (Signed)
Discharge summary reviewed with verbal understanding. First dose ab given prior to discharge. Escorted to personal vehicle.

## 2019-02-06 ENCOUNTER — Telehealth: Payer: Self-pay

## 2019-02-06 NOTE — Telephone Encounter (Signed)
HFU scheduled for 02/13/19.

## 2019-02-06 NOTE — Telephone Encounter (Signed)
I have made the 1st attempt to contact the patient or family member in charge, in order to follow up from recently being discharged from the hospital. I left a message on voicemail requesting a CB. -MM 

## 2019-02-07 NOTE — Telephone Encounter (Signed)
I have made the 2nd attempt to contact the patient or family member in charge, in order to follow up from recently being discharged from the hospital. Unable to leave a message on voicemail. Will try again later. -MM

## 2019-02-07 NOTE — Telephone Encounter (Signed)
I have tried to contact this pt several times and had no luck. Unable to complete TCM. FYI! -MM

## 2019-02-07 NOTE — Telephone Encounter (Signed)
Noted  

## 2019-02-12 NOTE — Progress Notes (Signed)
Patient: Brittany Herrera Female    DOB: Jan 29, 1971   48 y.o.   MRN: [id number] Visit Date: 02/13/2019  Today's Provider: Mar Daring, PA-C   Chief Complaint  Patient presents with  . Hospitalization Follow-up   Subjective:     HPI    Follow up Hospitalization  Patient was admitted to [facility] on 02/02/2019 and discharged on 02/05/2019. She was treated for Acute pyelonephritis and Sepsis. Treatment for this included Labs, CT Renal stone study and chest x-ray obtained. Patient treated with broad-spectrum antibiotics and discharged on Cipro. Advised to follow up with pcp and also have urine rechecked. Also advised to follow up with Urology Telephone follow up was not done but attempted on 02/06/2019 She reports excellent compliance with treatment. She reports this condition is Improved. She currently taking the antibiotic (Cipro). ------------------------------------------------------------------------------------  No Known Allergies   Current Outpatient Medications:  .  ciprofloxacin (CIPRO) 750 MG tablet, Take 1 tablet (750 mg total) by mouth 2 (two) times daily for 9 days., Disp: 18 tablet, Rfl: 0 .  naproxen (NAPROSYN) 500 MG tablet, Take 500 mg by mouth 2 (two) times daily with a meal., Disp: , Rfl:  .  acetaminophen (TYLENOL) 500 MG tablet, Take 500 mg by mouth every 6 (six) hours as needed., Disp: , Rfl:  .  albuterol (PROVENTIL HFA;VENTOLIN HFA) 108 (90 Base) MCG/ACT inhaler, Inhale 2 puffs into the lungs every 6 (six) hours as needed for wheezing or shortness of breath. (Patient not taking: Reported on 02/02/2019), Disp: 1 Inhaler, Rfl: 2  Review of Systems  Constitutional: Negative for appetite change, chills, fatigue and fever.  Respiratory: Negative for chest tightness and shortness of breath.   Cardiovascular: Negative for chest pain and palpitations.  Gastrointestinal: Negative for abdominal pain, nausea and vomiting.  Genitourinary: Negative for decreased  urine volume, dysuria, enuresis, flank pain, frequency, hematuria, pelvic pain, urgency, vaginal bleeding, vaginal discharge and vaginal pain.  Neurological: Negative for dizziness and weakness.    Social History   Tobacco Use  . Smoking status: Current Some Day Smoker    Packs/day: 0.00    Years: 23.00    Pack years: 0.00    Types: Cigarettes  . Smokeless tobacco: Never Used  . Tobacco comment: smokes 3 cigarettes per week  Substance Use Topics  . Alcohol use: Yes    Alcohol/week: 4.0 standard drinks    Types: 4 Shots of liquor per week      Objective:   BP 122/84 (BP Location: Left Arm, Patient Position: Sitting, Cuff Size: Large)   Pulse 87   Temp 98.7 F (37.1 C) (Oral)   Resp 16   Wt 163 lb (73.9 kg)   BMI 29.81 kg/m  Vitals:   02/13/19 1333  BP: 122/84  Pulse: 87  Resp: 16  Temp: 98.7 F (37.1 C)  TempSrc: Oral  Weight: 163 lb (73.9 kg)     Physical Exam Vitals signs reviewed.  Constitutional:      General: She is not in acute distress.    Appearance: Normal appearance. She is well-developed. She is not diaphoretic.  Cardiovascular:     Rate and Rhythm: Normal rate and regular rhythm.     Heart sounds: Normal heart sounds. No murmur. No friction rub. No gallop.   Pulmonary:     Effort: Pulmonary effort is normal. No respiratory distress.     Breath sounds: Normal breath sounds. No wheezing or rales.  Abdominal:  General: Bowel sounds are normal. There is no distension.     Palpations: Abdomen is soft. There is no mass.     Tenderness: There is no abdominal tenderness. There is no guarding or rebound.  Skin:    General: Skin is warm and dry.  Neurological:     Mental Status: She is alert and oriented to person, place, and time.        Assessment & Plan    1. Acute pyelonephritis Hospital notes from 02/02/19-02/05/19 were reviewed prior to appt today. UA normal today. Has 2 more days of Cipro. Continue until completed. Push fluids. Has Urology  appt on 03/06/19 with Dr. Erlene Quan. Call if symptoms return.  - POCT urinalysis dipstick     Mar Daring, PA-C  [facility]

## 2019-02-13 ENCOUNTER — Other Ambulatory Visit: Payer: Self-pay

## 2019-02-13 ENCOUNTER — Ambulatory Visit (INDEPENDENT_AMBULATORY_CARE_PROVIDER_SITE_OTHER): Payer: 59 | Admitting: Physician Assistant

## 2019-02-13 ENCOUNTER — Encounter: Payer: Self-pay | Admitting: Physician Assistant

## 2019-02-13 VITALS — BP 122/84 | HR 87 | Temp 98.7°F | Resp 16 | Wt 163.0 lb

## 2019-02-13 DIAGNOSIS — N10 Acute tubulo-interstitial nephritis: Secondary | ICD-10-CM

## 2019-02-13 LAB — POCT URINALYSIS DIPSTICK
Bilirubin, UA: NEGATIVE
Blood, UA: NEGATIVE
Glucose, UA: NEGATIVE
Ketones, UA: NEGATIVE
Leukocytes, UA: NEGATIVE
Nitrite, UA: NEGATIVE
Protein, UA: NEGATIVE
Spec Grav, UA: 1.015 (ref 1.010–1.025)
Urobilinogen, UA: 0.2 E.U./dL
pH, UA: 6 (ref 5.0–8.0)

## 2019-03-06 ENCOUNTER — Ambulatory Visit: Payer: BLUE CROSS/BLUE SHIELD | Admitting: Urology

## 2019-04-10 ENCOUNTER — Encounter: Payer: Self-pay | Admitting: Urology

## 2019-04-10 ENCOUNTER — Ambulatory Visit: Payer: 59 | Admitting: Urology

## 2019-04-10 NOTE — Progress Notes (Deleted)
04/10/2019 8:17 AM   Brittany Herrera 04/11/1971 [id number]  Referring provider: [name], MD [address] [place],  [place] [address]  No chief complaint on file.   HPI:    PMH: Past Medical History:  Diagnosis Date  . Abnormal Pap smear 2001   [facility]- told her that she had polyps didnt get tx  . Anemia    ONLY WITH PREGNANCY  . DUB (dysfunctional uterine bleeding)    menorrhagia, and bleeding continuously x3 yrs  . Family history of adverse reaction to anesthesia    MOM-HAS HARD TIME WAKING UP  . Kidney stones 2011, 2012  . Staghorn kidney stones     Surgical History: Past Surgical History:  Procedure Laterality Date  . CYSTOSCOPY W/ URETERAL STENT PLACEMENT Left 04/22/2015   Procedure: CYSTOSCOPY WITH STENT REPLACEMENT;  Surgeon: Collier Flowers, MD;  Location: [facility];  Service: Urology;  Laterality: Left;  . CYSTOSCOPY/URETEROSCOPY/HOLMIUM LASER/STENT PLACEMENT Left 04/08/2015   Procedure: CYSTOSCOPY/URETEROSCOPY/HOLMIUM LASER/STENT PLACEMENT;  Surgeon: [name], MD;  Location: [facility];  Service: Urology;  Laterality: Left;  . LITHOTRIPSY    . URETEROSCOPY WITH HOLMIUM LASER LITHOTRIPSY Left 04/22/2015   Procedure: URETEROSCOPY WITH HOLMIUM LASER LITHOTRIPSY;  Surgeon: Collier Flowers, MD;  Location: [facility];  Service: Urology;  Laterality: Left;    Home Medications:  Allergies as of 04/10/2019   No Known Allergies     Medication List       Accurate as of April 10, 2019  8:17 AM. If you have any questions, ask your nurse or doctor.        acetaminophen 500 MG tablet Commonly known as: TYLENOL Take 500 mg by mouth every 6 (six) hours as needed.   albuterol 108 (90 Base) MCG/ACT inhaler Commonly known as: VENTOLIN HFA Inhale 2 puffs into the lungs every 6 (six) hours as needed for wheezing or shortness of breath.   naproxen 500 MG tablet Commonly known as: NAPROSYN Take 500 mg by mouth 2 (two) times daily  with a meal.       Allergies: No Known Allergies  Family History: Family History  Problem Relation Age of Onset  . Cancer Mother 27       breast  . Asthma Mother   . Arthritis Mother   . Cancer Maternal Grandmother 16       breast, colon (pre-cancerous polyps)  . Anemia Maternal Grandmother   . Diabetes Paternal Grandmother   . Stroke Paternal Grandmother   . Anemia Sister   . Urinary tract infection Sister   . Anemia Daughter   . Heart disease Maternal Grandfather   . COPD Paternal Grandfather   . Anemia Sister   . Urinary tract infection Sister     Social History:  reports that she has been smoking cigarettes. She has been smoking about 0.00 packs per day for the past 23.00 years. She has never used smokeless tobacco. She reports current alcohol use of about 4.0 standard drinks of alcohol per week. She reports previous drug use. Drug: Marijuana.  ROS:                                        Physical Exam: There were no vitals taken for this visit.  Constitutional:  Alert and oriented, No acute distress. HEENT: [place] AT, moist mucus membranes.  Trachea midline, no masses. Cardiovascular:  No clubbing, cyanosis, or edema. Respiratory: Normal respiratory effort, no increased work of breathing. GI: Abdomen is soft, nontender, nondistended, no abdominal masses GU: No CVA tenderness Lymph: No cervical or inguinal lymphadenopathy. Skin: No rashes, bruises or suspicious lesions. Neurologic: Grossly intact, no focal deficits, moving all 4 extremities. Psychiatric: Normal mood and affect.  Laboratory Data: Lab Results  Component Value Date   WBC 8.5 02/04/2019   HGB 12.3 02/04/2019   HCT 35.6 (L) 02/04/2019   MCV 98.3 02/04/2019   PLT 171 02/04/2019    Lab Results  Component Value Date   CREATININE 0.76 02/04/2019    No results found for: PSA  No results found for: TESTOSTERONE  No results found for: HGBA1C  Urinalysis  Pertinent  Imaging: Results for orders placed during the hospital encounter of 02/02/19  CT Renal Stone Study   Narrative CLINICAL DATA:  Body swelling  EXAM: CT ABDOMEN AND PELVIS WITHOUT CONTRAST  TECHNIQUE: Multidetector CT imaging of the abdomen and pelvis was performed following the standard protocol without IV contrast.  COMPARISON:  10/28/2017  FINDINGS: Lower chest: No acute abnormality.  Hepatobiliary: No solid liver abnormality is seen. No gallstones, gallbladder wall thickening, or biliary dilatation.  Pancreas: Unremarkable. No pancreatic ductal dilatation or surrounding inflammatory changes.  Spleen: Normal in size without significant abnormality.  Adrenals/Urinary Tract: Adrenal glands are unremarkable. There is fat stranding about the right kidney and ureter without hydronephrosis. A previously seen inferior pole calculus is no longer noted and presumably passed. There is a punctuate, nonobstructive calculus of the superior pole. There are multiple nonobstructive left renal calculi. Bladder is unremarkable.  Stomach/Bowel: Stomach is within normal limits. Appendix appears normal. No evidence of bowel wall thickening, distention, or inflammatory changes.  Vascular/Lymphatic: No significant vascular findings are present. No enlarged abdominal or pelvic lymph nodes.  Reproductive: No mass or other significant abnormality.  Other: No abdominal wall hernia or abnormality. Small volume nonspecific free fluid in the low pelvis.  Musculoskeletal: No acute or significant osseous findings.  IMPRESSION: 1. There is fat stranding about the right kidney and ureter without hydronephrosis. A previously seen inferior pole calculus is no longer noted and presumably passed. There is a punctuate, nonobstructive calculus of the superior pole. Findings are consistent with pyelonephritis and/or recently passed calculus. There are multiple nonobstructive left renal calculi.  2.  Small volume nonspecific free fluid in the low pelvis, which may be functional or reactive.   Electronically Signed   By: Eddie Candle M.D.   On: 02/02/2019 19:45     Assessment & Plan:    There are no diagnoses linked to this encounter.  No follow-ups on file.  [name], MD  [facility] [address], [place], [place] [address] [phone]

## 2019-07-12 ENCOUNTER — Ambulatory Visit: Payer: Self-pay | Admitting: Family Medicine

## 2019-07-15 ENCOUNTER — Ambulatory Visit: Payer: Self-pay | Admitting: Family Medicine

## 2019-07-18 ENCOUNTER — Ambulatory Visit (INDEPENDENT_AMBULATORY_CARE_PROVIDER_SITE_OTHER): Payer: 59 | Admitting: Family Medicine

## 2019-07-18 ENCOUNTER — Other Ambulatory Visit (HOSPITAL_COMMUNITY)
Admission: RE | Admit: 2019-07-18 | Discharge: 2019-07-18 | Disposition: A | Discharge status: Home / Self Care | Payer: 59 | Source: Ambulatory Visit | Attending: Family Medicine | Admitting: Family Medicine

## 2019-07-18 ENCOUNTER — Other Ambulatory Visit: Payer: Self-pay

## 2019-07-18 ENCOUNTER — Encounter: Payer: Self-pay | Admitting: Family Medicine

## 2019-07-18 VITALS — BP 130/62 | HR 76 | Temp 97.3°F | Resp 16 | Wt 173.0 lb

## 2019-07-18 DIAGNOSIS — Z124 Encounter for screening for malignant neoplasm of cervix: Secondary | ICD-10-CM | POA: Insufficient documentation

## 2019-07-18 DIAGNOSIS — Z803 Family history of malignant neoplasm of breast: Secondary | ICD-10-CM

## 2019-07-18 DIAGNOSIS — N200 Calculus of kidney: Secondary | ICD-10-CM | POA: Diagnosis not present

## 2019-07-18 DIAGNOSIS — N921 Excessive and frequent menstruation with irregular cycle: Secondary | ICD-10-CM | POA: Diagnosis not present

## 2019-07-18 DIAGNOSIS — N1330 Unspecified hydronephrosis: Secondary | ICD-10-CM | POA: Diagnosis not present

## 2019-07-18 DIAGNOSIS — Z1231 Encounter for screening mammogram for malignant neoplasm of breast: Secondary | ICD-10-CM

## 2019-07-18 DIAGNOSIS — Z87448 Personal history of other diseases of urinary system: Secondary | ICD-10-CM

## 2019-07-18 DIAGNOSIS — Z6831 Body mass index (BMI) 31.0-31.9, adult: Secondary | ICD-10-CM

## 2019-07-18 DIAGNOSIS — R739 Hyperglycemia, unspecified: Secondary | ICD-10-CM

## 2019-07-18 DIAGNOSIS — E669 Obesity, unspecified: Secondary | ICD-10-CM

## 2019-07-18 MED ORDER — MEDROXYPROGESTERONE ACETATE 10 MG PO TABS: 10.0000 mg | ORAL_TABLET | Freq: Every day | ORAL | 1 refills | Status: DC

## 2019-07-18 NOTE — Assessment & Plan Note (Signed)
History of recurrent nephrolithiasis Previously multiple interventions by urology Recent hospitalization for pyelonephritis with hydronephrosis and nephrolithiasis Referral to urology for ongoing follow-up and management Currently asymptomatic

## 2019-07-18 NOTE — Assessment & Plan Note (Signed)
Ongoing chronic issue Has improved somewhat with Nexplanon, but continues to be irregular  Discussed that irregular bleeding is common with Nexplanon Gave options for management including watchful waiting, use of other contraceptives, and trying Provera as needed for prolonged bleeding episodes Patient does not desire IUD as she is concerned about the insertion We will use Provera as needed for prolonged episodes of bleeding Discussed she does not need to take this currently as she is not currently bleeding Check CBC and iron panel

## 2019-07-18 NOTE — Progress Notes (Signed)
Patient: Brittany Herrera Female    DOB: 1971/04/16   48 y.o.   MRN: [id number] Visit Date: 07/18/2019  Today's Provider: Lavon Paganini, MD   Chief Complaint  Patient presents with  . Menstrual Problem   Subjective:     HPI Irregular Menses:  Patient complains or irregular periods. She states her periods will skip 2 months at a time, and then bleed every day for a month or  More. During the time she is menstruating she reports having abdominal cramping, thick dark clots and weakness. Patient has tried Nexplanon in the past (about 1.5 years ago) to help regulate cycles. She states the Nexplanon helped for six months, but then her cycle became irregular again. Her LMP: started 2.5 weeks ago and ended 3 days ago.   Took Provera and it helped for a while.  Does not want IUD.  Was hospitalized in 01/2019 for pyelonephritis and recurrent nephrolithiasis.  She was supposed to see Urology after discharge, but this was delayed by the patient due to the pandemic.  She is requesting referral today  She has never had mammogram and is due for pap smear   No Known Allergies   Current Outpatient Medications:  .  acetaminophen (TYLENOL) 500 MG tablet, Take 500 mg by mouth every 6 (six) hours as needed., Disp: , Rfl:  .  albuterol (PROVENTIL HFA;VENTOLIN HFA) 108 (90 Base) MCG/ACT inhaler, Inhale 2 puffs into the lungs every 6 (six) hours as needed for wheezing or shortness of breath., Disp: 1 Inhaler, Rfl: 2  Review of Systems  Constitutional: Negative.   HENT: Negative.   Respiratory: Negative.   Cardiovascular: Negative.   Gastrointestinal: Negative.   Genitourinary: Positive for menstrual problem and vaginal bleeding. Negative for decreased urine volume, dysuria, flank pain, hematuria, vaginal discharge and vaginal pain.  Neurological: Negative.   Psychiatric/Behavioral: Negative.     Social History   Tobacco Use  . Smoking status: Current Some Day Smoker    Packs/day:  0.00    Years: 23.00    Pack years: 0.00    Types: Cigarettes  . Smokeless tobacco: Never Used  . Tobacco comment: smokes 3 cigarettes per week  Substance Use Topics  . Alcohol use: Yes    Alcohol/week: 4.0 standard drinks    Types: 4 Shots of liquor per week      Objective:   BP 130/62 (BP Location: Right Arm, Patient Position: Sitting, Cuff Size: Large)   Pulse 76   Temp (!) 97.3 F (36.3 C) (Temporal)   Resp 16   Wt 173 lb (78.5 kg)   SpO2 98% Comment: room air  BMI 31.64 kg/m  Vitals:   07/18/19 0954  BP: 130/62  Pulse: 76  Resp: 16  Temp: (!) 97.3 F (36.3 C)  TempSrc: Temporal  SpO2: 98%  Weight: 173 lb (78.5 kg)  Body mass index is 31.64 kg/m.   Physical Exam Vitals signs reviewed.  Constitutional:      General: She is not in acute distress.    Appearance: Normal appearance. She is well-developed. She is not diaphoretic.  HENT:     Head: Normocephalic and atraumatic.  Eyes:     General: No scleral icterus.    Conjunctiva/sclera: Conjunctivae normal.  Neck:     Musculoskeletal: Neck supple.     Thyroid: No thyromegaly.  Cardiovascular:     Rate and Rhythm: Normal rate and regular rhythm.     Pulses: Normal pulses.  Heart sounds: Normal heart sounds. No murmur.  Pulmonary:     Effort: Pulmonary effort is normal. No respiratory distress.     Breath sounds: Normal breath sounds. No wheezing, rhonchi or rales.  Genitourinary:    Comments: GYN:  External genitalia within normal limits.  Vaginal mucosa pink, moist, normal rugae.  Nonfriable cervix without lesions, no discharge or bleeding noted on speculum exam.  Bimanual exam revealed normal, nongravid uterus.  No cervical motion tenderness. No adnexal masses bilaterally.    Musculoskeletal:     Right lower leg: No edema.     Left lower leg: No edema.  Lymphadenopathy:     Cervical: No cervical adenopathy.  Skin:    General: Skin is warm and dry.     Capillary Refill: Capillary refill takes less  than 2 seconds.     Findings: No rash.  Neurological:     Mental Status: She is alert and oriented to person, place, and time. Mental status is at baseline.  Psychiatric:        Mood and Affect: Mood normal.        Behavior: Behavior normal.      No results found for any visits on 07/18/19.     Assessment & Plan   Problem List Items Addressed This Visit      Genitourinary   Recurrent nephrolithiasis    History of recurrent nephrolithiasis Previously multiple interventions by urology Recent hospitalization for pyelonephritis with hydronephrosis and nephrolithiasis Referral to urology for ongoing follow-up and management Currently asymptomatic      Relevant Orders   Ambulatory referral to Urology     Other   Family history of breast cancer    Screening mammogram ordered      Relevant Orders   MM 3D SCREEN BREAST BILATERAL   Menorrhagia - Primary    Ongoing chronic issue Has improved somewhat with Nexplanon, but continues to be irregular  Discussed that irregular bleeding is common with Nexplanon Gave options for management including watchful waiting, use of other contraceptives, and trying Provera as needed for prolonged bleeding episodes Patient does not desire IUD as she is concerned about the insertion We will use Provera as needed for prolonged episodes of bleeding Discussed she does not need to take this currently as she is not currently bleeding Check CBC and iron panel      Relevant Orders   CBC   Fe+TIBC+Fer   Hyperglycemia    Noted during hospitalization Check A1c      Relevant Orders   Hemoglobin A1c   Class 1 obesity without serious comorbidity with body mass index (BMI) of 31.0 to 31.9 in adult    Discussed importance of healthy weight management Discussed diet and exercise       Relevant Orders   CMP (Comprehensive metabolic panel)   Lipid panel    Other Visit Diagnoses    History of pyelonephritis       Relevant Orders   Ambulatory  referral to Urology   Hydronephrosis, unspecified hydronephrosis type       Relevant Orders   Ambulatory referral to Urology   Encounter for screening mammogram for malignant neoplasm of breast       Relevant Orders   MM 3D SCREEN BREAST BILATERAL   Screening for cervical cancer       Relevant Orders   Cytology - PAP       Return in about 6 months (around 01/15/2020) for CPE.   The entirety of the information  documented in the History of Present Illness, Review of Systems and Physical Exam were personally obtained by me. Portions of this information were initially documented by Meyer Cory, CMA and reviewed by me for thoroughness and accuracy.    Keyatta Tolles, Dionne Bucy, MD MPH [facility]

## 2019-07-18 NOTE — Assessment & Plan Note (Signed)
Screening mammogram ordered

## 2019-07-18 NOTE — Patient Instructions (Signed)
[phone] Call [facility] to schedule mammogram

## 2019-07-18 NOTE — Assessment & Plan Note (Signed)
Discussed importance of healthy weight management Discussed diet and exercise  

## 2019-07-18 NOTE — Assessment & Plan Note (Signed)
Noted during hospitalization Check A1c

## 2019-07-19 ENCOUNTER — Telehealth: Payer: Self-pay

## 2019-07-19 LAB — COMPREHENSIVE METABOLIC PANEL
ALT: 11 IU/L (ref 0–32)
AST: 18 IU/L (ref 0–40)
Albumin/Globulin Ratio: 2 (ref 1.2–2.2)
Albumin: 4.2 g/dL (ref 3.8–4.8)
Alkaline Phosphatase: 90 IU/L (ref 39–117)
BUN/Creatinine Ratio: 10 (ref 9–23)
BUN: 9 mg/dL (ref 6–24)
Bilirubin Total: 0.3 mg/dL (ref 0.0–1.2)
CO2: 19 mmol/L — ABNORMAL LOW (ref 20–29)
Calcium: 9.2 mg/dL (ref 8.7–10.2)
Chloride: 106 mmol/L (ref 96–106)
Creatinine, Ser: 0.91 mg/dL (ref 0.57–1.00)
GFR calc Af Amer: 88 mL/min/{1.73_m2} (ref >59)
GFR calc non Af Amer: 76 mL/min/{1.73_m2} (ref >59)
Globulin, Total: 2.1 g/dL (ref 1.5–4.5)
Glucose: 78 mg/dL (ref 65–99)
Potassium: 4 mmol/L (ref 3.5–5.2)
Sodium: 141 mmol/L (ref 134–144)
Total Protein: 6.3 g/dL (ref 6.0–8.5)

## 2019-07-19 LAB — CYTOLOGY - PAP
Chlamydia: NEGATIVE
Comment: NEGATIVE
Comment: NEGATIVE
Comment: NORMAL
Diagnosis: NEGATIVE
High risk HPV: NEGATIVE
Neisseria Gonorrhea: NEGATIVE

## 2019-07-19 LAB — LIPID PANEL
Chol/HDL Ratio: 3 ratio (ref 0.0–4.4)
Cholesterol, Total: 128 mg/dL (ref 100–199)
HDL: 42 mg/dL (ref >39)
LDL Chol Calc (NIH): 65 mg/dL (ref 0–99)
Triglycerides: 115 mg/dL (ref 0–149)
VLDL Cholesterol Cal: 21 mg/dL (ref 5–40)

## 2019-07-19 LAB — IRON,TIBC AND FERRITIN PANEL
Ferritin: 37 ng/mL (ref 15–150)
Iron Saturation: 32 % (ref 15–55)
Iron: 100 ug/dL (ref 27–159)
Total Iron Binding Capacity: 309 ug/dL (ref 250–450)
UIBC: 209 ug/dL (ref 131–425)

## 2019-07-19 LAB — CBC
Hematocrit: 44.1 % (ref 34.0–46.6)
Hemoglobin: 15.2 g/dL (ref 11.1–15.9)
MCH: 32.8 pg (ref 26.6–33.0)
MCHC: 34.5 g/dL (ref 31.5–35.7)
MCV: 95 fL (ref 79–97)
Platelets: 251 10*3/uL (ref 150–450)
RBC: 4.63 x10E6/uL (ref 3.77–5.28)
RDW: 12.7 % (ref 11.7–15.4)
WBC: 7 10*3/uL (ref 3.4–10.8)

## 2019-07-19 LAB — HEMOGLOBIN A1C
Est. average glucose Bld gHb Est-mCnc: 108 mg/dL
Hgb A1c MFr Bld: 5.4 % (ref 4.8–5.6)

## 2019-07-19 NOTE — Telephone Encounter (Signed)
-----   Message from [name], MD sent at 07/19/2019 10:51 AM EST ----- Normal labs

## 2019-07-19 NOTE — Telephone Encounter (Signed)
Tried calling-no answer.  

## 2019-07-22 NOTE — Telephone Encounter (Signed)
-----   Message from [name], MD sent at 07/22/2019 12:25 PM EST ----- Normal pap smear. HPV negative. STD testing negative.  There was some suggestion of BV.  If patient is having discharge or symptoms, could treat with Flagyl 500mg  BID x7d.

## 2019-07-22 NOTE — Telephone Encounter (Signed)
Tried calling; No answer.   Thanks,   -Laura  

## 2019-07-22 NOTE — Telephone Encounter (Signed)
Tried calling; no answer.   Thanks,   -Lorynn Moeser  

## 2019-07-24 NOTE — Telephone Encounter (Signed)
Tried calling; no answer.   Thanks,   -Laura  

## 2019-07-26 NOTE — Telephone Encounter (Signed)
NA

## 2019-07-30 ENCOUNTER — Telehealth: Payer: Self-pay

## 2019-07-30 NOTE — Telephone Encounter (Signed)
Copied from [place] [phone]. Topic: General - Other >> Jul 30, 2019  1:39 PM Burchel, Abbi R wrote: Pt called to get lab results. Unable to reach office. Please call pt. Pt would like to note that she has to retuen to work at [employer] today.

## 2019-07-30 NOTE — Telephone Encounter (Signed)
NA. No voice mail. Have sent a message on mychart.

## 2019-07-31 NOTE — Telephone Encounter (Signed)
Patient advised of normal lab results.

## 2019-07-31 NOTE — Telephone Encounter (Signed)
Patient advised of normal results

## 2019-09-04 ENCOUNTER — Ambulatory Visit: Payer: 59 | Admitting: Urology

## 2019-09-05 ENCOUNTER — Encounter: Payer: Self-pay | Admitting: Urology

## 2019-10-15 ENCOUNTER — Ambulatory Visit
Admission: RE | Admit: 2019-10-15 | Discharge: 2019-10-15 | Disposition: A | Discharge status: Home / Self Care | Payer: 59 | Source: Ambulatory Visit | Attending: Urology | Admitting: Urology

## 2019-10-15 ENCOUNTER — Encounter: Payer: Self-pay | Admitting: Urology

## 2019-10-15 ENCOUNTER — Other Ambulatory Visit: Payer: Self-pay

## 2019-10-15 ENCOUNTER — Ambulatory Visit (INDEPENDENT_AMBULATORY_CARE_PROVIDER_SITE_OTHER): Payer: 59 | Admitting: Urology

## 2019-10-15 VITALS — BP 168/92 | HR 84 | Ht 62.0 in | Wt 180.0 lb

## 2019-10-15 DIAGNOSIS — N200 Calculus of kidney: Secondary | ICD-10-CM | POA: Insufficient documentation

## 2019-10-15 DIAGNOSIS — Z87448 Personal history of other diseases of urinary system: Secondary | ICD-10-CM | POA: Diagnosis not present

## 2019-10-15 NOTE — Progress Notes (Signed)
10/15/2019 10:59 AM   Brittany Herrera 17-Sep-1970 [id number]  Referring provider: [name], [place],  [place] [address]  Chief Complaint  Patient presents with  . Nephrolithiasis    new patient    HPI: 49 year old female who presents today to establish care.  She is post history of kidney stones including history of staghorn kidney stones.  Her last surgical procedure for stones was 2016 with Dr. [name].  She was admitted in 02/16/2019 for Citrobacter bacteremia.  This was thought to be secondary to pyelonephritis and was treated with broad-spectrum antibiotics and ultimately Cipro.  Most recent CT scan on 01/2019 shows collection of nonobstructing left lower pole stone as well as a small punctate left upper pole stone.  These are essentially unchanged since 2019 scan.   Stone composition primarily calcium oxalate, 25% calcium oxalate dihydrate, 45% calcium oxalate monohydrate 30% calcium phosphate.  No recent flank pain.  No recent or frequent UTIs other than as above.    She is concerned today about job security related to hospitalizations and stone issues.    PMH: Past Medical History:  Diagnosis Date  . Abnormal Pap smear 2001   [facility]- told her that she had polyps didnt get tx  . Anemia    ONLY WITH PREGNANCY  . DUB (dysfunctional uterine bleeding)    menorrhagia, and bleeding continuously x3 yrs  . Family history of adverse reaction to anesthesia    MOM-HAS HARD TIME WAKING UP  . Kidney stones 2011, 2012  . Staghorn kidney stones     Surgical History: Past Surgical History:  Procedure Laterality Date  . CYSTOSCOPY W/ URETERAL STENT PLACEMENT Left 04/22/2015   Procedure: CYSTOSCOPY WITH STENT REPLACEMENT;  Surgeon: Collier Flowers, MD;  Location: [facility];  Service: Urology;  Laterality: Left;  . CYSTOSCOPY/URETEROSCOPY/HOLMIUM LASER/STENT PLACEMENT Left 04/08/2015   Procedure: CYSTOSCOPY/URETEROSCOPY/HOLMIUM  LASER/STENT PLACEMENT;  Surgeon: [name], MD;  Location: [facility];  Service: Urology;  Laterality: Left;  . LITHOTRIPSY    . URETEROSCOPY WITH HOLMIUM LASER LITHOTRIPSY Left 04/22/2015   Procedure: URETEROSCOPY WITH HOLMIUM LASER LITHOTRIPSY;  Surgeon: Collier Flowers, MD;  Location: [facility];  Service: Urology;  Laterality: Left;    Home Medications:  Allergies as of 10/15/2019   No Known Allergies     Medication List       Accurate as of October 15, 2019 10:59 AM. If you have any questions, ask your nurse or doctor.        STOP taking these medications   acetaminophen 500 MG tablet Commonly known as: TYLENOL Stopped by: [name], MD   albuterol 108 (90 Base) MCG/ACT inhaler Commonly known as: VENTOLIN HFA Stopped by: [name], MD   medroxyPROGESTERone 10 MG tablet Commonly known as: Provera Stopped by: [name], MD       Allergies: No Known Allergies  Family History: Family History  Problem Relation Age of Onset  . Cancer Mother 49       breast  . Asthma Mother   . Arthritis Mother   . Cancer Maternal Grandmother 61       breast, colon (pre-cancerous polyps)  . Anemia Maternal Grandmother   . Diabetes Paternal Grandmother   . Stroke Paternal Grandmother   . Anemia Sister   . Urinary tract infection Sister   . Anemia Daughter   . Heart disease Maternal Grandfather   . COPD Paternal Grandfather   . Anemia Sister   .  Urinary tract infection Sister   . Prostate cancer Maternal Uncle     Social History:  reports that she has been smoking cigarettes. She has been smoking about 0.00 packs per day for the past 23.00 years. She has never used smokeless tobacco. She reports current alcohol use of about 4.0 standard drinks of alcohol per week. She reports previous drug use. Drug: Marijuana.   Physical Exam: BP (!) 168/92   Pulse 84   Ht 5\' 2"  (1.575 m)   Wt 180 lb (81.6 kg)   LMP 10/15/2019   BMI 32.92 kg/m   Constitutional:  Alert  and oriented, No acute distress. HEENT: [place] AT, moist mucus membranes.  Trachea midline, no masses. Cardiovascular: No clubbing, cyanosis, or edema. Respiratory: Normal respiratory effort, no increased work of breathing. Skin: No rashes, bruises or suspicious lesions. Neurologic: Grossly intact, no focal deficits, moving all 4 extremities. Psychiatric: Normal mood and affect.  Laboratory Data: Lab Results  Component Value Date   WBC 7.0 07/18/2019   HGB 15.2 07/18/2019   HCT 44.1 07/18/2019   MCV 95 07/18/2019   PLT 251 07/18/2019    Lab Results  Component Value Date   CREATININE 0.91 07/18/2019    Lab Results  Component Value Date   HGBA1C 5.4 07/18/2019    Urinalysis Pending- asymptomatic, currently menstrating  Pertinent Imaging: Results for orders placed during the hospital encounter of 02/02/19  CT Renal Stone Study   Narrative CLINICAL DATA:  Body swelling  EXAM: CT ABDOMEN AND PELVIS WITHOUT CONTRAST  TECHNIQUE: Multidetector CT imaging of the abdomen and pelvis was performed following the standard protocol without IV contrast.  COMPARISON:  10/28/2017  FINDINGS: Lower chest: No acute abnormality.  Hepatobiliary: No solid liver abnormality is seen. No gallstones, gallbladder wall thickening, or biliary dilatation.  Pancreas: Unremarkable. No pancreatic ductal dilatation or surrounding inflammatory changes.  Spleen: Normal in size without significant abnormality.  Adrenals/Urinary Tract: Adrenal glands are unremarkable. There is fat stranding about the right kidney and ureter without hydronephrosis. A previously seen inferior pole calculus is no longer noted and presumably passed. There is a punctuate, nonobstructive calculus of the superior pole. There are multiple nonobstructive left renal calculi. Bladder is unremarkable.  Stomach/Bowel: Stomach is within normal limits. Appendix appears normal. No evidence of bowel wall thickening, distention,  or inflammatory changes.  Vascular/Lymphatic: No significant vascular findings are present. No enlarged abdominal or pelvic lymph nodes.  Reproductive: No mass or other significant abnormality.  Other: No abdominal wall hernia or abnormality. Small volume nonspecific free fluid in the low pelvis.  Musculoskeletal: No acute or significant osseous findings.  IMPRESSION: 1. There is fat stranding about the right kidney and ureter without hydronephrosis. A previously seen inferior pole calculus is no longer noted and presumably passed. There is a punctuate, nonobstructive calculus of the superior pole. Findings are consistent with pyelonephritis and/or recently passed calculus. There are multiple nonobstructive left renal calculi.  2. Small volume nonspecific free fluid in the low pelvis, which may be functional or reactive.   Electronically Signed   By: Eddie Candle M.D.   On: 02/02/2019 19:45     Above CT scan personally reviewed, agree with above radiological interpretation.   STable since 2019.  Assessment & Plan:    1. Recurrent nephrolithiasis Left lower poles stable, asymptomatic  Discussed options for treatment vs surveillance, desires surveillance at this time  KUB today for baseline then annually  Reassured can help with FMLA paperwork as needed if elects  surgery or with stone episodes  We discussed general stone prevention techniques including drinking plenty water with goal of producing 2.5 L urine daily, increased citric acid intake, avoidance of high oxalate containing foods, and decreased salt intake.  Information about dietary recommendations given today.   - Urinalysis, Complete - Abdomen 1 view (KUB); Future - Abdomen 1 view (KUB); Future  2. History of pyelonephritis Isolated with out recurrence  Treat prn  Do not suspect related to stones, rather ascending infection   Return in about 1 year (around 10/14/2020) for KUB.  (KUB today)  [name], MD  [place] [address], [place], [place] [address] [phone]

## 2019-10-16 LAB — URINALYSIS, COMPLETE
Bilirubin, UA: NEGATIVE
Glucose, UA: NEGATIVE
Ketones, UA: NEGATIVE
Leukocytes,UA: NEGATIVE
Nitrite, UA: NEGATIVE
Protein,UA: NEGATIVE
RBC, UA: NEGATIVE
Specific Gravity, UA: 1.025 (ref 1.005–1.030)
Urobilinogen, Ur: 0.2 mg/dL (ref 0.2–1.0)
pH, UA: 6 (ref 5.0–7.5)

## 2019-10-16 LAB — MICROSCOPIC EXAMINATION: RBC, Urine: NONE SEEN /hpf (ref 0–2)

## 2020-01-17 ENCOUNTER — Encounter: Payer: Self-pay | Admitting: Family Medicine

## 2020-01-17 NOTE — Progress Notes (Deleted)
Complete physical exam   Patient: Brittany Herrera   DOB: 1971-03-10   49 y.o. Female  MRN: [id number] Visit Date: 01/17/2020  I,Uchechi Denison S Christyana Corwin,acting as a scribe for Lavon Paganini, MD.,have documented all relevant documentation on the behalf of Lavon Paganini, MD,as directed by  Lavon Paganini, MD while in the presence of Lavon Paganini, MD.  Today's healthcare provider: Lavon Paganini, MD   No chief complaint on file.  Subjective    Brittany Herrera is a 49 y.o. female who presents today for a complete physical exam.  She reports consuming a {diet types:17450} diet. {Exercise:19826} She generally feels {well/fairly well/poorly:18703}. She reports sleeping {well/fairly well/poorly:18703}. She {does/does not:200015} have additional problems to discuss today.  HPI  07/18/2019 Pap/HPV-negative 07/18/2019 Normal labs 09/24/2017 Tdap   Past Medical History:  Diagnosis Date  . Abnormal Pap smear 2001   [facility] [facility]- told her that she had polyps didnt get tx  . Anemia    ONLY WITH PREGNANCY  . DUB (dysfunctional uterine bleeding)    menorrhagia, and bleeding continuously x3 yrs  . Family history of adverse reaction to anesthesia    MOM-HAS HARD TIME WAKING UP  . Kidney stones 2011, 2012  . Staghorn kidney stones    Past Surgical History:  Procedure Laterality Date  . CYSTOSCOPY W/ URETERAL STENT PLACEMENT Left 04/22/2015   Procedure: CYSTOSCOPY WITH STENT REPLACEMENT;  Surgeon: Collier Flowers, MD;  Location: [facility];  Service: Urology;  Laterality: Left;  . CYSTOSCOPY/URETEROSCOPY/HOLMIUM LASER/STENT PLACEMENT Left 04/08/2015   Procedure: CYSTOSCOPY/URETEROSCOPY/HOLMIUM LASER/STENT PLACEMENT;  Surgeon: [name], MD;  Location: [facility];  Service: Urology;  Laterality: Left;  . LITHOTRIPSY    . URETEROSCOPY WITH HOLMIUM LASER LITHOTRIPSY Left 04/22/2015   Procedure: URETEROSCOPY WITH HOLMIUM LASER LITHOTRIPSY;  Surgeon: Collier Flowers, MD;   Location: [facility];  Service: Urology;  Laterality: Left;   Social History   Socioeconomic History  . Marital status: Legally Separated    Spouse name: Not on file  . Number of children: 1  . Years of education: Not on file  . Highest education level: Not on file  Occupational History    Employer: [employer]  Tobacco Use  . Smoking status: Current Some Day Smoker    Packs/day: 0.00    Years: 23.00    Pack years: 0.00    Types: Cigarettes  . Smokeless tobacco: Never Used  . Tobacco comment: smokes 3 cigarettes per week  Substance and Sexual Activity  . Alcohol use: Yes    Alcohol/week: 4.0 standard drinks    Types: 4 Shots of liquor per week  . Drug use: Not Currently    Types: Marijuana    Comment: patient no longer smoking  . Sexual activity: Yes    Partners: Male    Birth control/protection: None  Other Topics Concern  . Not on file  Social History Narrative  . Not on file   Social Determinants of Health   Financial Resource Strain:   . Difficulty of Paying Living Expenses:   Food Insecurity:   . Worried About Charity fundraiser in the Last Year:   . Arboriculturist in the Last Year:   Transportation Needs:   . Film/video editor (Medical):   Marland Kitchen Lack of Transportation (Non-Medical):   Physical Activity:   . Days of Exercise per Week:   . Minutes of Exercise per Session:   Stress:   . Feeling of Stress :  Social Connections:   . Frequency of Communication with Friends and Family:   . Frequency of Social Gatherings with Friends and Family:   . Attends Religious Services:   . Active Member of Clubs or Organizations:   . Attends Archivist Meetings:   Marland Kitchen Marital Status:   Intimate Partner Violence:   . Fear of Current or Ex-Partner:   . Emotionally Abused:   Marland Kitchen Physically Abused:   . Sexually Abused:    Family Status  Relation Name Status  . Mother  Alive  . Father  Alive  . MGM  Deceased  . PGM  Deceased  . Sister  Alive  .  Daughter  Alive  . MGF  Deceased  . PGF  Deceased  . Sister  Alive  . Mat Uncle  (Not Specified)   Family History  Problem Relation Age of Onset  . Cancer Mother 69       breast  . Asthma Mother   . Arthritis Mother   . Cancer Maternal Grandmother 8       breast, colon (pre-cancerous polyps)  . Anemia Maternal Grandmother   . Diabetes Paternal Grandmother   . Stroke Paternal Grandmother   . Anemia Sister   . Urinary tract infection Sister   . Anemia Daughter   . Heart disease Maternal Grandfather   . COPD Paternal Grandfather   . Anemia Sister   . Urinary tract infection Sister   . Prostate cancer Maternal Uncle    No Known Allergies  Patient Care Team: [name], MD as PCP - General (Family Medicine)   Medications: No outpatient medications prior to visit.   No facility-administered medications prior to visit.    Review of Systems  Constitutional: Negative.   HENT: Negative.   Eyes: Negative.   Respiratory: Negative.   Cardiovascular: Negative.   Gastrointestinal: Negative.   Endocrine: Negative.   Genitourinary: Negative.   Musculoskeletal: Negative.   Skin: Negative.   Allergic/Immunologic: Negative.   Neurological: Negative.   Hematological: Negative.   Psychiatric/Behavioral: Negative.     {Heme  Chem  Endocrine  Serology  Results Review (optional):23779::" "}  Objective    There were no vitals taken for this visit. {Show previous vital signs (optional):23777::" "}  Physical Exam  ***  Depression Screen  PHQ 2/9 Scores 04/11/2018  PHQ - 2 Score 2  PHQ- 9 Score 10    No results found for any visits on 01/17/20.  Assessment & Plan    Routine Health Maintenance and Physical Exam  Exercise Activities and Dietary recommendations Goals   None     Immunization History  Administered Date(s) Administered  . Influenza,inj,Quad PF,6+ Mos 07/13/2018  . Tdap 09/24/2017    Health Maintenance  Topic Date Due  . COVID-19  Vaccine (1) Never done  . INFLUENZA VACCINE  03/29/2020  . PAP SMEAR-Modifier  07/17/2024  . TETANUS/TDAP  09/25/2027  . HIV Screening  Completed    Discussed health benefits of physical activity, and encouraged her to engage in regular exercise appropriate for her age and condition.  ***  No follow-ups on file.     {provider attestation***:1}   Lavon Paganini, MD  [facility] [phone] (phone) [phone] (fax)  [place]

## 2020-05-23 ENCOUNTER — Other Ambulatory Visit: Payer: Self-pay

## 2020-05-23 ENCOUNTER — Emergency Department: Payer: BC Managed Care – PPO

## 2020-05-23 ENCOUNTER — Inpatient Hospital Stay: Payer: BC Managed Care – PPO | Admitting: Certified Registered"

## 2020-05-23 ENCOUNTER — Encounter
Admission: EM | Disposition: A | Discharge status: Home / Self Care | Payer: Self-pay | Source: Home / Self Care | Attending: Emergency Medicine

## 2020-05-23 ENCOUNTER — Observation Stay
Admission: EM | Admit: 2020-05-23 | Discharge: 2020-05-25 | Disposition: A | Discharge status: Home / Self Care | Payer: BC Managed Care – PPO | Attending: Surgery | Admitting: Surgery

## 2020-05-23 DIAGNOSIS — Z8261 Family history of arthritis: Secondary | ICD-10-CM | POA: Insufficient documentation

## 2020-05-23 DIAGNOSIS — Z20822 Contact with and (suspected) exposure to covid-19: Secondary | ICD-10-CM | POA: Diagnosis not present

## 2020-05-23 DIAGNOSIS — F17210 Nicotine dependence, cigarettes, uncomplicated: Secondary | ICD-10-CM | POA: Insufficient documentation

## 2020-05-23 DIAGNOSIS — Z87442 Personal history of urinary calculi: Secondary | ICD-10-CM | POA: Diagnosis not present

## 2020-05-23 DIAGNOSIS — Z803 Family history of malignant neoplasm of breast: Secondary | ICD-10-CM | POA: Diagnosis not present

## 2020-05-23 DIAGNOSIS — Z841 Family history of disorders of kidney and ureter: Secondary | ICD-10-CM | POA: Diagnosis not present

## 2020-05-23 DIAGNOSIS — Z960 Presence of urogenital implants: Secondary | ICD-10-CM | POA: Diagnosis not present

## 2020-05-23 DIAGNOSIS — K565 Intestinal adhesions [bands], unspecified as to partial versus complete obstruction: Secondary | ICD-10-CM | POA: Diagnosis not present

## 2020-05-23 DIAGNOSIS — Z8042 Family history of malignant neoplasm of prostate: Secondary | ICD-10-CM | POA: Diagnosis not present

## 2020-05-23 DIAGNOSIS — Z79899 Other long term (current) drug therapy: Secondary | ICD-10-CM | POA: Insufficient documentation

## 2020-05-23 DIAGNOSIS — K388 Other specified diseases of appendix: Secondary | ICD-10-CM | POA: Diagnosis not present

## 2020-05-23 DIAGNOSIS — Z6829 Body mass index (BMI) 29.0-29.9, adult: Secondary | ICD-10-CM | POA: Diagnosis not present

## 2020-05-23 DIAGNOSIS — K56609 Unspecified intestinal obstruction, unspecified as to partial versus complete obstruction: Secondary | ICD-10-CM | POA: Diagnosis present

## 2020-05-23 DIAGNOSIS — Z833 Family history of diabetes mellitus: Secondary | ICD-10-CM | POA: Diagnosis not present

## 2020-05-23 DIAGNOSIS — K5652 Intestinal adhesions [bands] with complete obstruction: Secondary | ICD-10-CM | POA: Diagnosis not present

## 2020-05-23 DIAGNOSIS — Z823 Family history of stroke: Secondary | ICD-10-CM | POA: Diagnosis not present

## 2020-05-23 DIAGNOSIS — K460 Unspecified abdominal hernia with obstruction, without gangrene: Secondary | ICD-10-CM | POA: Diagnosis not present

## 2020-05-23 DIAGNOSIS — Z825 Family history of asthma and other chronic lower respiratory diseases: Secondary | ICD-10-CM | POA: Diagnosis not present

## 2020-05-23 DIAGNOSIS — E669 Obesity, unspecified: Secondary | ICD-10-CM | POA: Diagnosis not present

## 2020-05-23 HISTORY — PX: XI ROBOTIC ASSISTED SMALL BOWEL RESECTION: SHX6872

## 2020-05-23 LAB — COMPREHENSIVE METABOLIC PANEL
ALT: 16 U/L (ref 0–44)
AST: 22 U/L (ref 15–41)
Albumin: 4.1 g/dL (ref 3.5–5.0)
Alkaline Phosphatase: 85 U/L (ref 38–126)
Anion gap: 10 (ref 5–15)
BUN: 14 mg/dL (ref 6–20)
CO2: 20 mmol/L — ABNORMAL LOW (ref 22–32)
Calcium: 9.1 mg/dL (ref 8.9–10.3)
Chloride: 111 mmol/L (ref 98–111)
Creatinine, Ser: 0.94 mg/dL (ref 0.44–1.00)
GFR calc Af Amer: >60 mL/min (ref >60)
GFR calc non Af Amer: >60 mL/min (ref >60)
Glucose, Bld: 105 mg/dL — ABNORMAL HIGH (ref 70–99)
Potassium: 3.9 mmol/L (ref 3.5–5.1)
Sodium: 141 mmol/L (ref 135–145)
Total Bilirubin: 0.9 mg/dL (ref 0.3–1.2)
Total Protein: 7.2 g/dL (ref 6.5–8.1)

## 2020-05-23 LAB — CBC
HCT: 45.9 % (ref 36.0–46.0)
Hemoglobin: 15.8 g/dL — ABNORMAL HIGH (ref 12.0–15.0)
MCH: 34.3 pg — ABNORMAL HIGH (ref 26.0–34.0)
MCHC: 34.4 g/dL (ref 30.0–36.0)
MCV: 99.6 fL (ref 80.0–100.0)
Platelets: 193 10*3/uL (ref 150–400)
RBC: 4.61 MIL/uL (ref 3.87–5.11)
RDW: 13.2 % (ref 11.5–15.5)
WBC: 8.9 10*3/uL (ref 4.0–10.5)
nRBC: 0 % (ref 0.0–0.2)

## 2020-05-23 LAB — HCG, QUANTITATIVE, PREGNANCY: hCG, Beta Chain, Quant, S: <1 m[IU]/mL (ref <5)

## 2020-05-23 LAB — LIPASE, BLOOD: Lipase: 48 U/L (ref 11–51)

## 2020-05-23 LAB — RESPIRATORY PANEL BY RT PCR (FLU A&B, COVID)
Influenza A by PCR: NEGATIVE
Influenza B by PCR: NEGATIVE
SARS Coronavirus 2 by RT PCR: NEGATIVE

## 2020-05-23 SURGERY — EXCISION, SMALL INTESTINE, ROBOT-ASSISTED
Anesthesia: General

## 2020-05-23 MED ORDER — ACETAMINOPHEN 10 MG/ML IV SOLN
INTRAVENOUS | Status: AC
  Filled 2020-05-23: qty 100

## 2020-05-23 MED ORDER — ROCURONIUM BROMIDE 10 MG/ML (PF) SYRINGE
PREFILLED_SYRINGE | INTRAVENOUS | Status: AC
  Filled 2020-05-23: qty 10

## 2020-05-23 MED ORDER — SODIUM CHLORIDE 0.9 % IV SOLN
2.0000 g | Freq: Two times a day (BID) | INTRAVENOUS | Status: DC
  Administered 2020-05-23: 2 g via INTRAVENOUS
  Filled 2020-05-23: qty 2

## 2020-05-23 MED ORDER — PANTOPRAZOLE SODIUM 40 MG IV SOLR
40.0000 mg | Freq: Every day | INTRAVENOUS | Status: DC
  Administered 2020-05-23 – 2020-05-24 (×2): 40 mg via INTRAVENOUS
  Filled 2020-05-23 (×2): qty 40

## 2020-05-23 MED ORDER — ONDANSETRON 4 MG PO TBDP: 4.0000 mg | ORAL_TABLET | Freq: Four times a day (QID) | ORAL | Status: DC | PRN

## 2020-05-23 MED ORDER — ONDANSETRON HCL 4 MG/2ML IJ SOLN
4.0000 mg | Freq: Once | INTRAMUSCULAR | Status: AC
  Administered 2020-05-23: 4 mg via INTRAVENOUS
  Filled 2020-05-23: qty 2

## 2020-05-23 MED ORDER — PROCHLORPERAZINE EDISYLATE 10 MG/2ML IJ SOLN: 5.0000 mg | Freq: Four times a day (QID) | INTRAMUSCULAR | Status: DC | PRN

## 2020-05-23 MED ORDER — SODIUM CHLORIDE 0.9 % IV SOLN
2.0000 g | Freq: Two times a day (BID) | INTRAVENOUS | Status: AC
  Administered 2020-05-24 (×2): 2 g via INTRAVENOUS
  Filled 2020-05-23 (×2): qty 2

## 2020-05-23 MED ORDER — ONDANSETRON HCL 4 MG/2ML IJ SOLN
INTRAMUSCULAR | Status: DC | PRN
  Administered 2020-05-23: 4 mg via INTRAVENOUS

## 2020-05-23 MED ORDER — ACETAMINOPHEN 500 MG PO TABS
1000.0000 mg | ORAL_TABLET | Freq: Four times a day (QID) | ORAL | Status: DC
  Administered 2020-05-23 – 2020-05-25 (×6): 1000 mg via ORAL
  Filled 2020-05-23 (×6): qty 2

## 2020-05-23 MED ORDER — LIDOCAINE HCL (PF) 2 % IJ SOLN
INTRAMUSCULAR | Status: AC
  Filled 2020-05-23: qty 5

## 2020-05-23 MED ORDER — KETOROLAC TROMETHAMINE 30 MG/ML IJ SOLN
INTRAMUSCULAR | Status: AC
  Filled 2020-05-23: qty 1

## 2020-05-23 MED ORDER — MORPHINE SULFATE (PF) 2 MG/ML IV SOLN: 2.0000 mg | INTRAVENOUS | Status: DC | PRN

## 2020-05-23 MED ORDER — PROPOFOL 10 MG/ML IV BOLUS
INTRAVENOUS | Status: DC | PRN
  Administered 2020-05-23: 200 mg via INTRAVENOUS

## 2020-05-23 MED ORDER — MORPHINE SULFATE (PF) 4 MG/ML IV SOLN
4.0000 mg | Freq: Once | INTRAVENOUS | Status: AC
  Administered 2020-05-23: 4 mg via INTRAVENOUS
  Filled 2020-05-23: qty 1

## 2020-05-23 MED ORDER — DIPHENHYDRAMINE HCL 25 MG PO CAPS: 25.0000 mg | ORAL_CAPSULE | Freq: Four times a day (QID) | ORAL | Status: DC | PRN

## 2020-05-23 MED ORDER — ZOLPIDEM TARTRATE 5 MG PO TABS: 5.0000 mg | ORAL_TABLET | Freq: Every evening | ORAL | Status: DC | PRN

## 2020-05-23 MED ORDER — KETOROLAC TROMETHAMINE 30 MG/ML IJ SOLN: 30.0000 mg | Freq: Four times a day (QID) | INTRAMUSCULAR | Status: DC

## 2020-05-23 MED ORDER — HEPARIN SODIUM (PORCINE) 5000 UNIT/ML IJ SOLN: 5000.0000 [IU] | Freq: Three times a day (TID) | INTRAMUSCULAR | Status: DC

## 2020-05-23 MED ORDER — ONDANSETRON 4 MG PO TBDP: 4.0000 mg | ORAL_TABLET | Freq: Once | ORAL | Status: AC

## 2020-05-23 MED ORDER — SUGAMMADEX SODIUM 200 MG/2ML IV SOLN
INTRAVENOUS | Status: DC | PRN
  Administered 2020-05-23 (×2): 200 mg via INTRAVENOUS

## 2020-05-23 MED ORDER — FENTANYL CITRATE (PF) 100 MCG/2ML IJ SOLN
INTRAMUSCULAR | Status: AC
  Filled 2020-05-23: qty 2

## 2020-05-23 MED ORDER — BUPIVACAINE-EPINEPHRINE 0.25% -1:200000 IJ SOLN
INTRAMUSCULAR | Status: DC | PRN
  Administered 2020-05-23: 30 mL

## 2020-05-23 MED ORDER — KETOROLAC TROMETHAMINE 30 MG/ML IJ SOLN
30.0000 mg | Freq: Four times a day (QID) | INTRAMUSCULAR | Status: DC
  Administered 2020-05-23 – 2020-05-25 (×6): 30 mg via INTRAVENOUS
  Filled 2020-05-23 (×6): qty 1

## 2020-05-23 MED ORDER — OXYCODONE-ACETAMINOPHEN 5-325 MG PO TABS
1.0000 | ORAL_TABLET | ORAL | Status: DC | PRN
  Administered 2020-05-23: 1 via ORAL
  Filled 2020-05-23: qty 1

## 2020-05-23 MED ORDER — ACETAMINOPHEN 10 MG/ML IV SOLN
INTRAVENOUS | Status: DC | PRN
  Administered 2020-05-23: 1000 mg via INTRAVENOUS

## 2020-05-23 MED ORDER — LACTATED RINGERS IV SOLN: INTRAVENOUS | Status: DC | PRN

## 2020-05-23 MED ORDER — MIDAZOLAM HCL 2 MG/2ML IJ SOLN
INTRAMUSCULAR | Status: AC
  Filled 2020-05-23: qty 2

## 2020-05-23 MED ORDER — FENTANYL CITRATE (PF) 100 MCG/2ML IJ SOLN
INTRAMUSCULAR | Status: DC | PRN
Start: 2020-05-23 — End: 2020-05-23
  Administered 2020-05-23 (×2): 50 ug via INTRAVENOUS
  Administered 2020-05-23: 100 ug via INTRAVENOUS

## 2020-05-23 MED ORDER — SUCCINYLCHOLINE CHLORIDE 200 MG/10ML IV SOSY
PREFILLED_SYRINGE | INTRAVENOUS | Status: AC
  Filled 2020-05-23: qty 10

## 2020-05-23 MED ORDER — DEXAMETHASONE SODIUM PHOSPHATE 10 MG/ML IJ SOLN
INTRAMUSCULAR | Status: DC | PRN
  Administered 2020-05-23: 10 mg via INTRAVENOUS

## 2020-05-23 MED ORDER — SODIUM CHLORIDE 0.9 % IV SOLN
INTRAVENOUS | Status: AC
  Filled 2020-05-23: qty 2

## 2020-05-23 MED ORDER — INDOCYANINE GREEN 25 MG IV SOLR
INTRAVENOUS | Status: DC | PRN
  Administered 2020-05-23: 5 mg via INTRAVENOUS

## 2020-05-23 MED ORDER — PROPOFOL 10 MG/ML IV BOLUS
INTRAVENOUS | Status: AC
  Filled 2020-05-23: qty 20

## 2020-05-23 MED ORDER — ROCURONIUM BROMIDE 100 MG/10ML IV SOLN
INTRAVENOUS | Status: DC | PRN
  Administered 2020-05-23: 45 mg via INTRAVENOUS
  Administered 2020-05-23: 10 mg via INTRAVENOUS
  Administered 2020-05-23: 5 mg via INTRAVENOUS

## 2020-05-23 MED ORDER — BUPIVACAINE LIPOSOME 1.3 % IJ SUSP
INTRAMUSCULAR | Status: DC | PRN
  Administered 2020-05-23: 20 mL

## 2020-05-23 MED ORDER — OXYCODONE HCL 5 MG PO TABS: 5.0000 mg | ORAL_TABLET | ORAL | Status: DC | PRN

## 2020-05-23 MED ORDER — FENTANYL CITRATE (PF) 100 MCG/2ML IJ SOLN: 25.0000 ug | INTRAMUSCULAR | Status: DC | PRN

## 2020-05-23 MED ORDER — MIDAZOLAM HCL 2 MG/2ML IJ SOLN
INTRAMUSCULAR | Status: DC | PRN
  Administered 2020-05-23: 2 mg via INTRAVENOUS

## 2020-05-23 MED ORDER — DIPHENHYDRAMINE HCL 50 MG/ML IJ SOLN: 25.0000 mg | Freq: Four times a day (QID) | INTRAMUSCULAR | Status: DC | PRN

## 2020-05-23 MED ORDER — SODIUM CHLORIDE 0.9 % IV SOLN: INTRAVENOUS | Status: DC

## 2020-05-23 MED ORDER — PROMETHAZINE HCL 25 MG/ML IJ SOLN: 6.2500 mg | INTRAMUSCULAR | Status: DC | PRN

## 2020-05-23 MED ORDER — PROCHLORPERAZINE MALEATE 10 MG PO TABS
10.0000 mg | ORAL_TABLET | Freq: Four times a day (QID) | ORAL | Status: DC | PRN
  Filled 2020-05-23: qty 1

## 2020-05-23 MED ORDER — ONDANSETRON 4 MG PO TBDP
ORAL_TABLET | ORAL | Status: AC
  Administered 2020-05-23: 4 mg via ORAL
  Filled 2020-05-23: qty 1

## 2020-05-23 MED ORDER — INDOCYANINE GREEN 25 MG IV SOLR
INTRAVENOUS | Status: AC
  Filled 2020-05-23: qty 10

## 2020-05-23 MED ORDER — ONDANSETRON HCL 4 MG/2ML IJ SOLN: 4.0000 mg | Freq: Four times a day (QID) | INTRAMUSCULAR | Status: DC | PRN

## 2020-05-23 MED ORDER — SODIUM CHLORIDE 0.9 % IV SOLN
1000.0000 mL | Freq: Once | INTRAVENOUS | Status: AC
  Administered 2020-05-23: 1000 mL via INTRAVENOUS

## 2020-05-23 MED ORDER — ONDANSETRON HCL 4 MG/2ML IJ SOLN: 4.0000 mg | Freq: Once | INTRAMUSCULAR | Status: DC

## 2020-05-23 MED ORDER — SUCCINYLCHOLINE CHLORIDE 20 MG/ML IJ SOLN
INTRAMUSCULAR | Status: DC | PRN
  Administered 2020-05-23: 100 mg via INTRAVENOUS

## 2020-05-23 MED ORDER — KETOROLAC TROMETHAMINE 30 MG/ML IJ SOLN
INTRAMUSCULAR | Status: DC | PRN
  Administered 2020-05-23: 30 mg via INTRAVENOUS

## 2020-05-23 SURGICAL SUPPLY — 60 items
ADH SKN CLS APL DERMABOND .7 (GAUZE/BANDAGES/DRESSINGS) ×1
APL PRP STRL LF DISP 70% ISPRP (MISCELLANEOUS) ×1
CANISTER SUCT 1200ML W/VALVE (MISCELLANEOUS) ×2 IMPLANT
CANNULA REDUC XI 12-8 STAPL (CANNULA) ×2
CANNULA REDUCER 12-8 DVNC XI (CANNULA) ×1 IMPLANT
CHLORAPREP W/TINT 26 (MISCELLANEOUS) ×2 IMPLANT
COVER LIGHT HANDLE STERIS (MISCELLANEOUS) ×2 IMPLANT
COVER TIP SHEARS 8 DVNC (MISCELLANEOUS) ×1 IMPLANT
COVER TIP SHEARS 8MM DA VINCI (MISCELLANEOUS) ×2
COVER WAND RF STERILE (DRAPES) ×2 IMPLANT
DEFOGGER SCOPE WARMER CLEARIFY (MISCELLANEOUS) ×2 IMPLANT
DERMABOND ADVANCED (GAUZE/BANDAGES/DRESSINGS) ×1
DERMABOND ADVANCED .7 DNX12 (GAUZE/BANDAGES/DRESSINGS) ×1 IMPLANT
DRAPE 3/4 80X56 (DRAPES) ×2 IMPLANT
DRAPE ARM DVNC X/XI (DISPOSABLE) ×4 IMPLANT
DRAPE COLUMN DVNC XI (DISPOSABLE) ×1 IMPLANT
DRAPE DA VINCI XI ARM (DISPOSABLE) ×8
DRAPE DA VINCI XI COLUMN (DISPOSABLE) ×2
ELECT CAUTERY BLADE 6.4 (BLADE) ×2 IMPLANT
ELECT REM PT RETURN 9FT ADLT (ELECTROSURGICAL) ×2
ELECTRODE REM PT RTRN 9FT ADLT (ELECTROSURGICAL) ×1 IMPLANT
GLOVE BIO SURGEON STRL SZ7 (GLOVE) ×4 IMPLANT
GOWN STRL REUS W/ TWL LRG LVL3 (GOWN DISPOSABLE) ×3 IMPLANT
GOWN STRL REUS W/TWL LRG LVL3 (GOWN DISPOSABLE) ×6
GRASPER SUT TROCAR 14GX15 (MISCELLANEOUS) ×2 IMPLANT
IRRIGATION STRYKERFLOW (MISCELLANEOUS) IMPLANT
IRRIGATOR STRYKERFLOW (MISCELLANEOUS) ×2
IV NS 1000ML (IV SOLUTION) ×2
IV NS 1000ML BAXH (IV SOLUTION) IMPLANT
KIT PINK PAD W/HEAD ARE REST (MISCELLANEOUS) ×2
KIT PINK PAD W/HEAD ARM REST (MISCELLANEOUS) ×1 IMPLANT
LABEL OR SOLS (LABEL) ×2 IMPLANT
NEEDLE HYPO 22GX1.5 SAFETY (NEEDLE) ×3 IMPLANT
OBTURATOR OPTICAL STANDARD 8MM (TROCAR) ×2
OBTURATOR OPTICAL STND 8 DVNC (TROCAR) ×1
OBTURATOR OPTICALSTD 8 DVNC (TROCAR) ×1 IMPLANT
PACK LAP CHOLECYSTECTOMY (MISCELLANEOUS) ×2 IMPLANT
PENCIL ELECTRO HAND CTR (MISCELLANEOUS) ×2 IMPLANT
POUCH ENDO CATCH 10MM SPEC (MISCELLANEOUS) ×1 IMPLANT
SEAL CANN UNIV 5-8 DVNC XI (MISCELLANEOUS) ×3 IMPLANT
SEAL XI 5MM-8MM UNIVERSAL (MISCELLANEOUS) ×6
SET TUBE SMOKE EVAC HIGH FLOW (TUBING) ×2 IMPLANT
SLEEVE SCD COMPRESS KNEE MED (MISCELLANEOUS) ×1 IMPLANT
SOLUTION ELECTROLUBE (MISCELLANEOUS) ×2 IMPLANT
SPONGE LAP 18X18 RF (DISPOSABLE) ×2 IMPLANT
SPONGE LAP 4X18 RFD (DISPOSABLE) ×1 IMPLANT
STAPLER CANNULA SEAL DVNC XI (STAPLE) ×1 IMPLANT
STAPLER CANNULA SEAL XI (STAPLE) ×2
SUT MNCRL 4-0 (SUTURE) ×2
SUT MNCRL 4-0 27XMFL (SUTURE) ×1
SUT STRATAFIX PDS 30 CT-1 (SUTURE) ×2 IMPLANT
SUT VIC AB 2-0 CT1 (SUTURE) ×1 IMPLANT
SUT VIC AB 3-0 SH 27 (SUTURE) ×2
SUT VIC AB 3-0 SH 27X BRD (SUTURE) IMPLANT
SUT VICRYL 0 AB UR-6 (SUTURE) ×6 IMPLANT
SUT VLOC 90 2/L VL 12 GS22 (SUTURE) ×4 IMPLANT
SUT VLOC 90 6 CV-15 VIOLET (SUTURE) ×1 IMPLANT
SUTURE MNCRL 4-0 27XMF (SUTURE) ×1 IMPLANT
TAPE TRANSPORE STRL 2 31045 (GAUZE/BANDAGES/DRESSINGS) ×2 IMPLANT
TROCAR BALLN GELPORT 12X130M (ENDOMECHANICALS) ×2 IMPLANT

## 2020-05-23 NOTE — Transfer of Care (Signed)
Immediate Anesthesia Transfer of Care Note  Patient: Brittany Herrera  Procedure(s) Performed: XI ROBOTIC ASSISTED SMALL BOWEL OBSTRUCTION; LYSIS OF ADHESIONS. INCIDENTAL APPENDECTOMY (N/A )  Patient Location: PACU  Anesthesia Type:General  Level of Consciousness: awake, alert  and oriented  Airway & Oxygen Therapy: Patient Spontanous Breathing and Patient connected to nasal cannula oxygen  Post-op Assessment: Report given to RN and Post -op Vital signs reviewed and stable  Post vital signs: Reviewed and stable  Last Vitals:  Vitals Value Taken Time  BP 141/97 1746  Temp 35.6   Pulse 102   Resp 21   SpO2 98     Last Pain: 0 Vitals:   05/23/20 1245  TempSrc:   PainSc: 1          Complications: No complications documented.

## 2020-05-23 NOTE — Progress Notes (Signed)
15 minute call to floor. 

## 2020-05-23 NOTE — H&P (Signed)
Patient ID: Brittany Herrera, female   DOB: November 10, 1970, 49 y.o.   MRN: [id number]  HPI Brittany Herrera is a 49 y.o. female seen in the ER for abdominal pain.  Patient reports that the pain is start this morning and wake her from sleep.  Pain is located in the right lower quadrant is sharp and severe in nature.  No specific alleviating or aggravating factors other than that improved some after receiving morphine.  She did have nausea but no vomiting.  No fevers no chills.  She is otherwise healthy and is able to perform more than 4 METS of activity without any shortness of breath or chest pain.  She denies any prior abdominal operations.  BC was normal CMP shows acidosis but no other abnormalities.  CT scan personally reviewed and evidence of a clear-cut transition point in the right lower quadrant with mesenteric edema and also bowel edema.  It looks like a knuckle of small bowel concerning for internal hernia.  There is no pneumatosis there is no free air  HPI  Past Medical History:  Diagnosis Date  . Abnormal Pap smear 2001   [facility]- told her that she had polyps didnt get tx  . Anemia    ONLY WITH PREGNANCY  . DUB (dysfunctional uterine bleeding)    menorrhagia, and bleeding continuously x3 yrs  . Family history of adverse reaction to anesthesia    MOM-HAS HARD TIME WAKING UP  . Kidney stones 2011, 2012  . Staghorn kidney stones     Past Surgical History:  Procedure Laterality Date  . CYSTOSCOPY W/ URETERAL STENT PLACEMENT Left 04/22/2015   Procedure: CYSTOSCOPY WITH STENT REPLACEMENT;  Surgeon: Collier Flowers, MD;  Location: [facility];  Service: Urology;  Laterality: Left;  . CYSTOSCOPY/URETEROSCOPY/HOLMIUM LASER/STENT PLACEMENT Left 04/08/2015   Procedure: CYSTOSCOPY/URETEROSCOPY/HOLMIUM LASER/STENT PLACEMENT;  Surgeon: [name], MD;  Location: [facility];  Service: Urology;  Laterality: Left;  . LITHOTRIPSY    . URETEROSCOPY WITH HOLMIUM LASER LITHOTRIPSY Left  04/22/2015   Procedure: URETEROSCOPY WITH HOLMIUM LASER LITHOTRIPSY;  Surgeon: Collier Flowers, MD;  Location: [facility];  Service: Urology;  Laterality: Left;    Family History  Problem Relation Age of Onset  . Cancer Mother 66       breast  . Asthma Mother   . Arthritis Mother   . Cancer Maternal Grandmother 76       breast, colon (pre-cancerous polyps)  . Anemia Maternal Grandmother   . Diabetes Paternal Grandmother   . Stroke Paternal Grandmother   . Anemia Sister   . Urinary tract infection Sister   . Anemia Daughter   . Heart disease Maternal Grandfather   . COPD Paternal Grandfather   . Anemia Sister   . Urinary tract infection Sister   . Prostate cancer Maternal Uncle     Social History Social History   Tobacco Use  . Smoking status: Current Some Day Smoker    Packs/day: 0.00    Years: 23.00    Pack years: 0.00    Types: Cigarettes  . Smokeless tobacco: Never Used  . Tobacco comment: smokes 3 cigarettes per week  Vaping Use  . Vaping Use: Some days  Substance Use Topics  . Alcohol use: Yes    Alcohol/week: 4.0 standard drinks    Types: 4 Shots of liquor per week  . Drug use: Not Currently    Types: Marijuana    Comment: patient no longer smoking    No  Known Allergies  Current Facility-Administered Medications  Medication Dose Route Frequency Provider Last Rate Last Admin  . cefoTEtan (CEFOTAN) 2 g in sodium chloride 0.9 % 100 mL IVPB  2 g Intravenous Q12H Jessica Seidman F, MD      . oxyCODONE-acetaminophen (PERCOCET/ROXICET) 5-325 MG per tablet 1 tablet  1 tablet Oral Q30 min PRN Lavonia Drafts, MD   1 tablet at 05/23/20 0414   No current outpatient medications on file.     Review of Systems Full ROS  was asked and was negative except for the information on the HPI  Physical Exam Blood pressure (!) 154/96, pulse 82, temperature 98.1 F (36.7 C), temperature source Oral, resp. rate (!) 22, height 5\' 4"  (1.626 m), weight 77.1 kg, SpO2 100  %. CONSTITUTIONAL: NAD EYES: Pupils are equal, round, Sclera are non-icteric. EARS, NOSE, MOUTH AND THROAT: She is wearing a mask.  Hearing is intact to voice. LYMPH NODES:  Lymph nodes in the neck are normal. RESPIRATORY:  Lungs are clear. There is normal respiratory effort, with equal breath sounds bilaterally, and without pathologic use of accessory muscles. CARDIOVASCULAR: Heart is regular without murmurs, gallops, or rubs. GI: The abdomen is  Soft,decrease BS, soft TTP RLQ with focal peritonitis.  GU: Rectal deferred.   MUSCULOSKELETAL: Normal muscle strength and tone. No cyanosis or edema.   SKIN: Turgor is good and there are no pathologic skin lesions or ulcers. NEUROLOGIC: Motor and sensation is grossly normal. Cranial nerves are grossly intact. PSYCH:  Oriented to person, place and time. Affect is normal.  Data Reviewed  I have personally reviewed the patient's imaging, laboratory findings and medical records.    Assessment/Plan 49 year old female with an acute abdomen and a small bowel obstruction concerning for closed-loop obstruction.  There is a clear-cut transition area.  These findings in virgin abdomen indicated surgical disease.  Discussed with the patient detail and her small bowel is not that dilated and I do think that we can attempt diagnostic laparoscopy/robotic and potential bowel dissection.  We may entertain also removing the appendix even the possible confounding factors that will carry in the near future given the proximity to her current actual disease.  Discussed with the patient in detail.  Risk, benefits and possible complications including but not limited to: bleeding, infection, bowel injuries, prolonged hospitalization, ileus and pain.  He wishes to proceed.  Pending or availability we will do this case today. We will start IV fluid bolus and an NG tube.  Caroleen Hamman, MD FACS General Surgeon 05/23/2020, 1:43 PM

## 2020-05-23 NOTE — Op Note (Signed)
Robotic assisted laparoscopic Enterolysis with appendectomy  Pre-operative Diagnosis: SBO  Post-operative Diagnosis: same  Procedure:  Robotic assisted laparoscopic  Enterolysis with appendectomy   Surgeon: Caroleen Hamman, MD FACS  Anesthesia: Gen. with endotracheal tube  Findings: Adhesion RLQ creating a closed loop bowel obstruction / internal hernia Viable SB after injection of ICG w good perfusion  Estimated Blood Loss: 10cc       Specimens: appendix       Complications: none   Procedure Details  The patient was seen again in the Holding Room. The benefits, complications, treatment options, and expected outcomes were discussed with the patient. The risks of bleeding, infection, recurrence of symptoms, failure to resolve symptoms, bile duct damage, bile duct leak, retained common bile duct stone, bowel injury, any of which could require further surgery and/or ERCP, stent, or papillotomy were reviewed with the patient. The likelihood of improving the patient's symptoms with return to their baseline status is good.  The patient and/or family concurred with the proposed plan, giving informed consent.  The patient was taken to Operating Room, identified  and the procedure verified . A Time Out was held and the above information confirmed.  Prior to the induction of general anesthesia, antibiotic prophylaxis was administered. VTE prophylaxis was in place. General endotracheal anesthesia was then administered and tolerated well. After the induction, the abdomen was prepped with Chloraprep and draped in the sterile fashion. The patient was positioned in the supine position.  Cut down technique was used to enter the abdominal cavity and a Hasson trochar was placed after two vicryl stitches were anchored to the fascia. Pneumoperitoneum was then created with CO2 and tolerated well without any adverse changes in the patient's vital signs.  Three 8-mm ports were placed under direct vision. All skin  incisions  were infiltrated with a local anesthetic agent before making the incision and placing the trocars.   The patient was positioned  in reverse Trendelenburg, robot was brought to the surgical field and docked in the standard fashion.  We made sure all the instrumentation was kept indirect view at all times and that there were no collision between the arms. I scrubbed out and went to the console.  Laparoscopy revealed an internal hernia caused by an adhesive band of omentum, it was likely related to an old episode of appendicitis or inflammatory process within the cecum/appendix. Enterolysis performed sharply so the bowel was freed from the band. The bowel was contused but no necrosis. ICG used to confirmed viability of the bowel.  Since the RLQ was the culprit and I could not exclude a prior appendectomy I thought it was on her best interest to proceed with appendectomy. A mesenteric defect was created at The base of the  Appendix , using force bipolar appendiceal artery was cauterized and mesentery divided. Using a string of 2-0 vicryl one porximal and distal tied was placed at the base of the appendix and this was divided. A purse string was created to dunk the stump w a 3-0 v-loc suture. I irrigated RLQ with no evidence of injuries or bleeding.   Hemostasis was achieved with the electrocautery.Inspection of the right lower quadrant was performed. No bleeding, bile  leak, or bowel injury was noted. Robotic instruments and robotic arms were undocked in the standard fashion.  I scrubbed back in.  The appendix was removed and placed in an Endocatch bag.   Pneumoperitoneum was released.  The periumbilical port site was closed with interrumpted 0 Vicryl sutures. 4-0  subcuticular Monocryl was used to close the skin. Dermabond was  applied.  The patient was then extubated and brought to the recovery room in stable condition. Sponge, lap, and needle counts were correct at closure and at the conclusion  of the case. liposomal marcaine was infiltrated on all incisions./              Caroleen Hamman, MD, FACS

## 2020-05-23 NOTE — Anesthesia Procedure Notes (Signed)
Procedure Name: Intubation Date/Time: 05/23/2020 3:45 PM Performed by: Sherrine Maples, CRNA Pre-anesthesia Checklist: Patient identified, Patient being monitored, Timeout performed, Emergency Drugs available and Suction available Patient Re-evaluated:Patient Re-evaluated prior to induction Oxygen Delivery Method: Circle system utilized Preoxygenation: Pre-oxygenation with 100% oxygen Induction Type: IV induction Laryngoscope Size: Miller and 2 Grade View: Grade I Tube type: Oral Tube size: 7.0 mm Number of attempts: 1 Airway Equipment and Method: Stylet Placement Confirmation: ETT inserted through vocal cords under direct vision,  positive ETCO2 and breath sounds checked- equal and bilateral Secured at: 21 cm Tube secured with: Tape Dental Injury: Teeth and Oropharynx as per pre-operative assessment

## 2020-05-23 NOTE — Anesthesia Preprocedure Evaluation (Signed)
Anesthesia Evaluation  Patient identified by MRN, date of birth, ID band Patient awake    Reviewed: Allergy & Precautions, NPO status , Patient's Chart, lab work & pertinent test results  History of Anesthesia Complications (+) Family history of anesthesia reaction  Airway Mallampati: II  TM Distance: >3 FB Neck ROM: Full    Dental  (+) Poor Dentition, Chipped, Dental Advidsory Given   Pulmonary Current Smoker,    Pulmonary exam normal breath sounds clear to auscultation       Cardiovascular negative cardio ROS Normal cardiovascular exam     Neuro/Psych PSYCHIATRIC DISORDERS Anxiety    GI/Hepatic negative GI ROS, Neg liver ROS,   Endo/Other  negative endocrine ROS  Renal/GU Renal diseaseKidney stones  Female GU complaint Dysfunctional uterine bleeding    Musculoskeletal negative musculoskeletal ROS (+)   Abdominal Normal abdominal exam  (+)   Peds  Hematology  (+) Blood dyscrasia, anemia ,   Anesthesia Other Findings Past Medical History: 2001: Abnormal Pap smear     Comment:  [facility]- told her that she had polyps didnt               get tx No date: Anemia     Comment:  ONLY WITH PREGNANCY No date: DUB (dysfunctional uterine bleeding)     Comment:  menorrhagia, and bleeding continuously x3 yrs No date: Family history of adverse reaction to anesthesia     Comment:  MOM-HAS HARD TIME WAKING UP 2011, 2012: Kidney stones No date: Staghorn kidney stones   Reproductive/Obstetrics                             Anesthesia Physical  Anesthesia Plan  ASA: II  Anesthesia Plan: General   Post-op Pain Management:    Induction: Intravenous, Cricoid pressure planned and Rapid sequence  PONV Risk Score and Plan: 2 and Ondansetron, Dexamethasone, Treatment may vary due to age or medical condition and Midazolam  Airway Management Planned: Oral ETT  Additional Equipment:    Intra-op Plan:   Post-operative Plan: Extubation in OR  Informed Consent: I have reviewed the patients History and Physical, chart, labs and discussed the procedure including the risks, benefits and alternatives for the proposed anesthesia with the patient or authorized representative who has indicated his/her understanding and acceptance.     Dental advisory given  Plan Discussed with: CRNA and Surgeon  Anesthesia Plan Comments:         Anesthesia Quick Evaluation

## 2020-05-23 NOTE — ED Notes (Signed)
Patient tolerated IV Morphine and Zofran well. Ambulatory to the bathroom with some output but "not much". Patient in no pain at this time, as it comes in waves.

## 2020-05-23 NOTE — Anesthesia Postprocedure Evaluation (Signed)
Anesthesia Post Note  Patient: MARUA QIN  Procedure(s) Performed: XI ROBOTIC ASSISTED SMALL BOWEL OBSTRUCTION; LYSIS OF ADHESIONS. INCIDENTAL APPENDECTOMY (N/A )  Patient location during evaluation: PACU Anesthesia Type: General Level of consciousness: awake and alert Pain management: pain level controlled Vital Signs Assessment: post-procedure vital signs reviewed and stable Respiratory status: spontaneous breathing, nonlabored ventilation, respiratory function stable and patient connected to nasal cannula oxygen Cardiovascular status: blood pressure returned to baseline and stable Postop Assessment: no apparent nausea or vomiting Anesthetic complications: no   No complications documented.   Last Vitals:  Vitals:   05/23/20 1831 05/23/20 1858  BP: 128/80 130/76  Pulse: 68 64  Resp: 20 18  Temp:  36.5 C  SpO2: 96% 93%    Last Pain:  Vitals:   05/23/20 2059  TempSrc:   PainSc: 0-No pain                 Martha Clan

## 2020-05-23 NOTE — ED Notes (Signed)
Right lower quadrant pain that woke her up at 0300 this morning. History of kidney stones. Denies N/V. Has not been able to urinate since 2000 last night. Last episode of kidney stones was 4-5 months, able to pass them but ended up here with urosepsis. Denies fever at home.

## 2020-05-23 NOTE — ED Notes (Signed)
Report given to [facility] in [place]

## 2020-05-23 NOTE — ED Triage Notes (Signed)
Patient c/o lower right abdominal pain. Patient tender to palpation. Patient reports hx of kidney stones.

## 2020-05-23 NOTE — ED Provider Notes (Signed)
[facility] Emergency Department Provider Note   ____________________________________________    I have reviewed the triage vital signs and the nursing notes.   HISTORY  Chief Complaint Abdominal Pain     HPI Brittany Herrera is a 49 y.o. female who presents with complaints of right lower quadrant pain which started relatively abruptly at approximately 3 AM.  She reports he felt well last night.  Went to sleep as per usual.  Woke up with sharp severe pain in her right lower quadrant, radiates to the left lower quadrant.  Mild nausea no vomiting.  Has had kidney stones in the past, no history of abdominal surgery.  Describes the pain is sharp and stabbing.  No dysuria.  No hematuria.  Has not take anything for this  Past Medical History:  Diagnosis Date  . Abnormal Pap smear 2001   [facility]- told her that she had polyps didnt get tx  . Anemia    ONLY WITH PREGNANCY  . DUB (dysfunctional uterine bleeding)    menorrhagia, and bleeding continuously x3 yrs  . Family history of adverse reaction to anesthesia    MOM-HAS HARD TIME WAKING UP  . Kidney stones 2011, 2012  . [place] kidney stones     Patient Active Problem List   Diagnosis Date Noted  . Hyperglycemia 07/18/2019  . Class 1 obesity without serious comorbidity with body mass index (BMI) of 31.0 to 31.9 in adult 07/18/2019  . Menorrhagia 07/13/2018  . Encounter for counseling regarding contraception 04/11/2018  . Tobacco use disorder 04/11/2018  . Family history of breast cancer 04/11/2018  . Recurrent nephrolithiasis 03/24/2015  . Anxiety 03/21/2013    Past Surgical History:  Procedure Laterality Date  . CYSTOSCOPY W/ URETERAL STENT PLACEMENT Left 04/22/2015   Procedure: CYSTOSCOPY WITH STENT REPLACEMENT;  Surgeon: Collier Flowers, MD;  Location: [facility] ORS;  Service: Urology;  Laterality: Left;  . CYSTOSCOPY/URETEROSCOPY/HOLMIUM LASER/STENT PLACEMENT Left 04/08/2015    Procedure: CYSTOSCOPY/URETEROSCOPY/HOLMIUM LASER/STENT PLACEMENT;  Surgeon: [name], MD;  Location: [facility] ORS;  Service: Urology;  Laterality: Left;  . LITHOTRIPSY    . URETEROSCOPY WITH HOLMIUM LASER LITHOTRIPSY Left 04/22/2015   Procedure: URETEROSCOPY WITH HOLMIUM LASER LITHOTRIPSY;  Surgeon: Collier Flowers, MD;  Location: [facility] ORS;  Service: Urology;  Laterality: Left;    Prior to Admission medications   Not on File     Allergies Patient has no known allergies.  Family History  Problem Relation Age of Onset  . Cancer Mother 35       breast  . Asthma Mother   . Arthritis Mother   . Cancer Maternal Grandmother 26       breast, colon (pre-cancerous polyps)  . Anemia Maternal Grandmother   . Diabetes Paternal Grandmother   . Stroke Paternal Grandmother   . Anemia Sister   . Urinary tract infection Sister   . Anemia Daughter   . Heart disease Maternal Grandfather   . COPD Paternal Grandfather   . Anemia Sister   . Urinary tract infection Sister   . Prostate cancer Maternal Uncle     Social History Social History   Tobacco Use  . Smoking status: Current Some Day Smoker    Packs/day: 0.00    Years: 23.00    Pack years: 0.00    Types: Cigarettes  . Smokeless tobacco: Never Used  . Tobacco comment: smokes 3 cigarettes per week  Vaping Use  . Vaping Use: Some days  Substance Use  Topics  . Alcohol use: Yes    Alcohol/week: 4.0 standard drinks    Types: 4 Shots of liquor per week  . Drug use: Not Currently    Types: Marijuana    Comment: patient no longer smoking    Review of Systems  Constitutional: No fever/chills Eyes: No visual changes.  ENT: No sore throat. Cardiovascular: Denies chest pain. Respiratory: Denies shortness of breath. Gastrointestinal: As above Genitourinary: As above Musculoskeletal: Negative for back pain. Skin: Negative for rash. Neurological: Negative for headaches   ____________________________________________   PHYSICAL  EXAM:  VITAL SIGNS: ED Triage Vitals  Enc Vitals Group     BP 05/23/20 0409 (!) 148/102     Pulse Rate 05/23/20 0409 (!) 112     Resp 05/23/20 0409 20     Temp 05/23/20 0409 98.7 F (37.1 C)     Temp Source 05/23/20 0854 Oral     SpO2 05/23/20 0409 96 %     Weight 05/23/20 0406 77.1 kg (170 lb)     Height 05/23/20 0406 1.626 m (5\' 4" )     Head Circumference --      Peak Flow --      Pain Score 05/23/20 0406 10     Pain Loc --      Pain Edu? --      Excl. in [place]? --     Constitutional: Alert and oriented.  Nose: No congestion/rhinnorhea. Mouth/Throat: Mucous membranes are moist.   Neck:  Painless ROM Cardiovascular: Normal rate, regular rhythm. Grossly normal heart sounds.  Good peripheral circulation. Respiratory: Normal respiratory effort.  No retractions. Lungs CTAB. Gastrointestinal: Soft, tenderness palpation the right lower quadrant greater than left lower quadrant. No distention.  No CVA tenderness.  Musculoskeletal:   Warm and well perfused Neurologic:  Normal speech and language. No gross focal neurologic deficits are appreciated.  Skin:  Skin is warm, dry and intact. No rash noted. Psychiatric: Mood and affect are normal. Speech and behavior are normal.  ____________________________________________   LABS (all labs ordered are listed, but only abnormal results are displayed)  Labs Reviewed  COMPREHENSIVE METABOLIC PANEL - Abnormal; Notable for the following components:      Result Value   CO2 20 (*)    Glucose, Bld 105 (*)    All other components within normal limits  CBC - Abnormal; Notable for the following components:   Hemoglobin 15.8 (*)    MCH 34.3 (*)    All other components within normal limits  RESPIRATORY PANEL BY RT PCR (FLU A&B, COVID)  LIPASE, BLOOD  HCG, QUANTITATIVE, PREGNANCY  URINALYSIS, COMPLETE (UACMP) WITH MICROSCOPIC  POC URINE PREG, ED    ____________________________________________  EKG  None ____________________________________________  RADIOLOGY  CT renal stone study reviewed by me, loops of small bowel suspicious for SBO, pending radiology review   Radiology review demonstrates small bowel obstruction with transition point in the right lower quadrant ____________________________________________   PROCEDURES  Procedure(s) performed: No  Procedures   Critical Care performed: No ____________________________________________   INITIAL IMPRESSION / ASSESSMENT AND PLAN / ED COURSE  Pertinent labs & imaging results that were available during my care of the patient were reviewed by me and considered in my medical decision making (see chart for details).  Patient presents with complaints of right lower quadrant abdominal pain as noted above.  Differential includes ureterolithiasis, UTI, appendicitis, less likely colitis  We will treat with IV morphine, IV Zofran, IV fluids  CT abdomen pelvis consistent with small  bowel obstruction as noted by radiology  Discussed with Dr. Dahlia Byes of general surgery, he agrees with NG tube, he will admit    ____________________________________________   FINAL CLINICAL IMPRESSION(S) / ED DIAGNOSES  Final diagnoses:  Small bowel obstruction ([place])        Note:  This document was prepared using Dragon voice recognition software and may include unintentional dictation errors.   Lavonia Drafts, MD 05/23/20 1258

## 2020-05-24 ENCOUNTER — Encounter: Payer: Self-pay | Admitting: Surgery

## 2020-05-24 LAB — CBC
HCT: 39.2 % (ref 36.0–46.0)
Hemoglobin: 14.4 g/dL (ref 12.0–15.0)
MCH: 35.1 pg — ABNORMAL HIGH (ref 26.0–34.0)
MCHC: 36.7 g/dL — ABNORMAL HIGH (ref 30.0–36.0)
MCV: 95.6 fL (ref 80.0–100.0)
Platelets: 209 10*3/uL (ref 150–400)
RBC: 4.1 MIL/uL (ref 3.87–5.11)
RDW: 13.2 % (ref 11.5–15.5)
WBC: 15.3 10*3/uL — ABNORMAL HIGH (ref 4.0–10.5)
nRBC: 0 % (ref 0.0–0.2)

## 2020-05-24 LAB — HIV ANTIBODY (ROUTINE TESTING W REFLEX): HIV Screen 4th Generation wRfx: NONREACTIVE

## 2020-05-24 LAB — BASIC METABOLIC PANEL
Anion gap: 7 (ref 5–15)
BUN: 16 mg/dL (ref 6–20)
CO2: 22 mmol/L (ref 22–32)
Calcium: 7.9 mg/dL — ABNORMAL LOW (ref 8.9–10.3)
Chloride: 110 mmol/L (ref 98–111)
Creatinine, Ser: 0.9 mg/dL (ref 0.44–1.00)
GFR calc Af Amer: >60 mL/min (ref >60)
GFR calc non Af Amer: >60 mL/min (ref >60)
Glucose, Bld: 149 mg/dL — ABNORMAL HIGH (ref 70–99)
Potassium: 3.9 mmol/L (ref 3.5–5.1)
Sodium: 139 mmol/L (ref 135–145)

## 2020-05-24 LAB — PHOSPHORUS: Phosphorus: 2.7 mg/dL (ref 2.5–4.6)

## 2020-05-24 LAB — MAGNESIUM: Magnesium: 1.8 mg/dL (ref 1.7–2.4)

## 2020-05-24 MED ORDER — HEPARIN SODIUM (PORCINE) 5000 UNIT/ML IJ SOLN
5000.0000 [IU] | Freq: Three times a day (TID) | INTRAMUSCULAR | Status: DC
  Administered 2020-05-24 – 2020-05-25 (×3): 5000 [IU] via SUBCUTANEOUS
  Filled 2020-05-24 (×3): qty 1

## 2020-05-24 NOTE — Progress Notes (Signed)
POD # 1 s/p LOA lap Doing great + flatus Taking CLD No N/V  PE NAD Abd: soft, appropriate incisional tenderness, no peritonitis. . No infection  A/P Doing great Advance diet Mobilize Keep ivf for now as she still volume depleted DC inam

## 2020-05-25 MED ORDER — IBUPROFEN 800 MG PO TABS: 800.0000 mg | ORAL_TABLET | Freq: Three times a day (TID) | ORAL | 0 refills | Status: DC | PRN

## 2020-05-25 MED ORDER — OXYCODONE HCL 5 MG PO TABS
5.0000 mg | ORAL_TABLET | Freq: Four times a day (QID) | ORAL | 0 refills | Status: DC | PRN
Start: 2020-05-25 — End: 2020-08-04

## 2020-05-25 NOTE — Discharge Summary (Signed)
[facility] SURGICAL DISCHARGE SUMMARY  Patient ID: Brittany Herrera MRN: [id number] DOB/AGE: 02-Nov-1970 49 y.o.  Admit date: 05/23/2020 Discharge date: 05/25/2020  Discharge Diagnoses SBO  Consultants None  Procedures 05/23/2020:  Robotic assisted laparoscopic lysis of adhesion, appendectomy  HPI: Brittany Herrera is a 49 y.o. female seen in the ER for abdominal pain.  Patient reports that the pain is start this morning and wake her from sleep.  Pain is located in the right lower quadrant is sharp and severe in nature.  No specific alleviating or aggravating factors other than that improved some after receiving morphine.  She did have nausea but no vomiting.  No fevers no chills.  She is otherwise healthy and is able to perform more than 4 METS of activity without any shortness of breath or chest pain.  She denies any prior abdominal operations.  BC was normal CMP shows acidosis but no other abnormalities.  CT scan personally reviewed and evidence of a clear-cut transition point in the right lower quadrant with mesenteric edema and also bowel edema.  It looks like a knuckle of small bowel concerning for internal hernia.  There is no pneumatosis there is no free air  Hospital Course: Informed consent was obtained and documented, and patient underwent uneventful Robotic assisted laparoscopic lysis of adhesion, appendectomy (Dr Dahlia Byes, 05/23/2020).  Post-operatively, patient's symptoms improved/resolved and advancement of patient's diet and ambulation were well-tolerated. The remainder of patient's hospital course was essentially unremarkable, and discharge planning was initiated accordingly with patient safely able to be discharged home with appropriate discharge instructions, pain control, and outpatient follow-up after all of her questions were answered to her expressed satisfaction.   Discharge Condition: Good   Physical Examination:  Constitutional: Well appearing  female, NAD Pulmonary: Normal effort, no respiratory distress Gastrointestinal: Soft, incisional soreness, non-distended, no rebound/guarding Skin: Laparoscopic incisions are CDI with dermabond, no erythema or drainage    Allergies as of 05/25/2020   No Known Allergies     Medication List    TAKE these medications   ibuprofen 800 MG tablet Commonly known as: ADVIL Take 1 tablet (800 mg total) by mouth every 8 (eight) hours as needed.   oxyCODONE 5 MG immediate release tablet Commonly known as: Oxy IR/ROXICODONE Take 1 tablet (5 mg total) by mouth every 6 (six) hours as needed for severe pain or breakthrough pain.         Follow-up Information    Tylene Fantasia, PA-C. Schedule an appointment as soon as possible for a visit in 2 week(s).   Specialty: Physician Assistant Why: s/p laparoscopic appendectomy and LOA Contact information: [address] [place] [address] [phone]                Time spent on discharge management including discussion of hospital course, clinical condition, outpatient instructions, prescriptions, and follow up with the patient and members of the medical team: >30 minutes  -- Edison Simon , PA-C [facility]  05/25/2020, 9:10 AM [phone] M-F: 7am - 4pm

## 2020-05-25 NOTE — Discharge Instructions (Signed)
In addition to included general post-operative instructions for laparoscopic lysis of adhesions (scar tissues) and appendectomy,  Diet: Resume home heart healthy diet.   Activity: No heavy lifting >20 pounds (children, pets, laundry, garbage) for 4 weeks, but light activity and walking are encouraged. Do not drive or drink alcohol if taking narcotic pain medications or having pain that might distract from driving.  Wound care: You may shower/get incision wet with soapy water and pat dry (do not rub incisions), but no baths or submerging incision underwater until follow-up.   Medications: Resume all home medications. For mild to moderate pain: acetaminophen (Tylenol) or ibuprofen/naproxen (if no kidney disease). Combining Tylenol with alcohol can substantially increase your risk of causing liver disease. Narcotic pain medications, if prescribed, can be used for severe pain, though may cause nausea, constipation, and drowsiness. Do not combine Tylenol and Percocet (or similar) within a 6 hour period as Percocet (and similar) contain(s) Tylenol. If you do not need the narcotic pain medication, you do not need to fill the prescription.  Call office [phone] / [phone]) at any time if any questions, worsening pain, fevers/chills, bleeding, drainage from incision site, or other concerns.

## 2020-05-26 LAB — SURGICAL PATHOLOGY

## 2020-06-10 ENCOUNTER — Encounter: Payer: Self-pay | Admitting: Physician Assistant

## 2020-06-10 ENCOUNTER — Ambulatory Visit (INDEPENDENT_AMBULATORY_CARE_PROVIDER_SITE_OTHER): Payer: BC Managed Care – PPO | Admitting: Physician Assistant

## 2020-06-10 ENCOUNTER — Encounter: Payer: Self-pay | Admitting: Emergency Medicine

## 2020-06-10 ENCOUNTER — Other Ambulatory Visit: Payer: Self-pay

## 2020-06-10 VITALS — BP 119/83 | HR 80 | Temp 98.2°F | Ht 62.0 in | Wt 171.0 lb

## 2020-06-10 DIAGNOSIS — K56609 Unspecified intestinal obstruction, unspecified as to partial versus complete obstruction: Secondary | ICD-10-CM

## 2020-06-10 DIAGNOSIS — Z09 Encounter for follow-up examination after completed treatment for conditions other than malignant neoplasm: Secondary | ICD-10-CM

## 2020-06-10 NOTE — Progress Notes (Signed)
[facility] POST-OP OFFICE VISIT  06/10/2020  HPI: Brittany Herrera is a 48 y.o. female 18 days s/p robotic assisted laparoscopic lysis of adhesions for SBO.   She is doing well No issues with pain No fever, chills, nausea, emesis Tolerating PO Having bowel function  Vital signs: BP 119/83   Pulse 80   Temp 98.2 F (36.8 C) (Oral)   Ht 5\' 2"  (1.575 m)   Wt 171 lb (77.6 kg)   SpO2 98%   BMI 31.28 kg/m    Physical Exam: Constitutional: Well appearing female, NAD Abdomen: Soft, non-tender, non-distended, no rebound/guarding Skin: Laparoscopic incisions are well healed, no erythema or drainage  Assessment/Plan: This is a 49 y.o. female 18 days s/p robotic assisted laparoscopic lysis of adhesions   - Pain control prn  - Reviewed lifting restrictions  - rtc prn   -- Edison Simon, PA-C [facility] 06/10/2020, 11:01 AM [phone] M-F: 7am - 4pm

## 2020-06-10 NOTE — Patient Instructions (Addendum)
Follow up as needed, call the office if you have any questions or concerns.   GENERAL POST-OPERATIVE PATIENT INSTRUCTIONS   WOUND CARE INSTRUCTIONS:  Keep a dry clean dressing on the wound if there is drainage. The initial bandage may be removed after 24 hours.  Once the wound has quit draining you may leave it open to air.  If clothing rubs against the wound or causes irritation and the wound is not draining you may cover it with a dry dressing during the daytime.  Try to keep the wound dry and avoid ointments on the wound unless directed to do so.  If the wound becomes bright red and painful or starts to drain infected material that is not clear, please contact your physician immediately.  If the wound is mildly pink and has a thick firm ridge underneath it, this is normal, and is referred to as a healing ridge.  This will resolve over the next 4-6 weeks.  BATHING: You may shower if you have been informed of this by your surgeon. However, Please do not submerge in a tub, hot tub, or pool until incisions are completely sealed or have been told by your surgeon that you may do so.  DIET:  You may eat any foods that you can tolerate.  It is a good idea to eat a high fiber diet and take in plenty of fluids to prevent constipation.  If you do become constipated you may want to take a mild laxative or take ducolax tablets on a daily basis until your bowel habits are regular.  Constipation can be very uncomfortable, along with straining, after recent surgery.  ACTIVITY:  You are encouraged to cough and deep breath or use your incentive spirometer if you were given one, every 15-30 minutes when awake.  This will help prevent respiratory complications and low grade fevers post-operatively if you had a general anesthetic.  You may want to hug a pillow when coughing and sneezing to add additional support to the surgical area, if you had abdominal or chest surgery, which will decrease pain during these times.  You  are encouraged to walk and engage in light activity for the next two weeks.  You should not lift more than 20 pounds, until 4 to 6 weeks after surgery as it could put you at increased risk for complications.  Twenty pounds is roughly equivalent to a plastic bag of groceries. At that time- Listen to your body when lifting, if you have pain when lifting, stop and then try again in a few days. Soreness after doing exercises or activities of daily living is normal as you get back in to your normal routine.  MEDICATIONS:  Try to take narcotic medications and anti-inflammatory medications, such as tylenol, ibuprofen, naprosyn, etc., with food.  This will minimize stomach upset from the medication.  Should you develop nausea and vomiting from the pain medication, or develop a rash, please discontinue the medication and contact your physician.  You should not drive, make important decisions, or operate machinery when taking narcotic pain medication.  SUNBLOCK Use sun block to incision area over the next year if this area will be exposed to sun. This helps decrease scarring and will allow you avoid a permanent darkened area over your incision.  QUESTIONS:  Please feel free to call our office if you have any questions, and we will be glad to assist you. [phone].

## 2020-06-17 ENCOUNTER — Telehealth: Payer: Self-pay | Admitting: *Deleted

## 2020-06-17 NOTE — Telephone Encounter (Signed)
Faxed FMLA to [employer] [phone]

## 2020-08-04 ENCOUNTER — Ambulatory Visit (INDEPENDENT_AMBULATORY_CARE_PROVIDER_SITE_OTHER): Payer: BC Managed Care – PPO | Admitting: Urology

## 2020-08-04 ENCOUNTER — Other Ambulatory Visit: Payer: Self-pay

## 2020-08-04 VITALS — BP 144/87 | HR 80 | Wt 171.0 lb

## 2020-08-04 DIAGNOSIS — N200 Calculus of kidney: Secondary | ICD-10-CM

## 2020-08-04 NOTE — H&P (View-Only) (Signed)
08/04/2020 2:19 PM   Brittany Herrera Jun 06, 1971 [id number]  Referring provider: [name], MD [address] [place],  [place] [address]  Chief Complaint  Patient presents with  . Nephrolithiasis    HPI: 49 year old female with a personal history of recurrent stone disease who presents today to discuss her nonobstructing left lower pole stones.  She is undergone multiple procedures in the past including left PCNL and left ureteroscopy x2.  Her most recent surgical procedure was 2016.  Last year, she was admitted with pyelonephritis without an obstructing stone.  She is very anxious about her stones.  She is been written up for missing work for stone disease.  She occasionally has some twinges in her left flank and is worried about the stones passing.  Most recent imaging in the form of noncontrast CT scan in 04/2020 with stable nonobstructing left lower pole stone burden measuring up to 9 mm x 2.  Minimal right-sided stones.  Essentially unchanged from stones in 2020.  The scans were personally reviewed today.  She does have some time off around the holidays and is wondering if she can have her stone procedure performed during this interval.  PMH: Past Medical History:  Diagnosis Date  . Abnormal Pap smear 2001   [facility]- told her that she had polyps didnt get tx  . Anemia    ONLY WITH PREGNANCY  . DUB (dysfunctional uterine bleeding)    menorrhagia, and bleeding continuously x3 yrs  . Family history of adverse reaction to anesthesia    MOM-HAS HARD TIME WAKING UP  . Kidney stones 2011, 2012  . Staghorn kidney stones     Surgical History: Past Surgical History:  Procedure Laterality Date  . CYSTOSCOPY W/ URETERAL STENT PLACEMENT Left 04/22/2015   Procedure: CYSTOSCOPY WITH STENT REPLACEMENT;  Surgeon: Collier Flowers, MD;  Location: [facility];  Service: Urology;  Laterality: Left;  . CYSTOSCOPY/URETEROSCOPY/HOLMIUM LASER/STENT  PLACEMENT Left 04/08/2015   Procedure: CYSTOSCOPY/URETEROSCOPY/HOLMIUM LASER/STENT PLACEMENT;  Surgeon: [name], MD;  Location: [facility];  Service: Urology;  Laterality: Left;  . LITHOTRIPSY    . URETEROSCOPY WITH HOLMIUM LASER LITHOTRIPSY Left 04/22/2015   Procedure: URETEROSCOPY WITH HOLMIUM LASER LITHOTRIPSY;  Surgeon: Collier Flowers, MD;  Location: [facility];  Service: Urology;  Laterality: Left;  . XI ROBOTIC ASSISTED SMALL BOWEL RESECTION N/A 05/23/2020   Procedure: XI ROBOTIC ASSISTED SMALL BOWEL OBSTRUCTION; LYSIS OF ADHESIONS. INCIDENTAL APPENDECTOMY;  Surgeon: Jules Husbands, MD;  Location: [facility];  Service: General;  Laterality: N/A;    Home Medications:  Allergies as of 08/04/2020   No Known Allergies     Medication List       Accurate as of August 04, 2020  2:19 PM. If you have any questions, ask your nurse or doctor.        STOP taking these medications   ibuprofen 800 MG tablet Commonly known as: ADVIL Stopped by: [name], MD   oxyCODONE 5 MG immediate release tablet Commonly known as: Oxy IR/ROXICODONE Stopped by: [name], MD       Allergies: No Known Allergies  Family History: Family History  Problem Relation Age of Onset  . Cancer Mother 21       breast  . Asthma Mother   . Arthritis Mother   . Cancer Maternal Grandmother 36       breast, colon (pre-cancerous polyps)  . Anemia Maternal Grandmother   . Diabetes Paternal Grandmother   .  Stroke Paternal Grandmother   . Anemia Sister   . Urinary tract infection Sister   . Anemia Daughter   . Heart disease Maternal Grandfather   . COPD Paternal Grandfather   . Anemia Sister   . Urinary tract infection Sister   . Prostate cancer Maternal Uncle     Social History:  reports that she has been smoking cigarettes. She has been smoking about 0.00 packs per day for the past 23.00 years. She has never used smokeless tobacco. She reports current alcohol use of about 4.0 standard drinks of  alcohol per week. She reports previous drug use. Drug: Marijuana.   Physical Exam: BP (!) 144/87   Pulse 80   Wt 171 lb (77.6 kg)   BMI 31.28 kg/m   Constitutional:  Alert and oriented, No acute distress. HEENT: [place] AT, moist mucus membranes.  Trachea midline, no masses. Cardiovascular: No clubbing, cyanosis, or edema. Respiratory: Normal respiratory effort, no increased work of breathing. Skin: No rashes, bruises or suspicious lesions. Neurologic: Grossly intact, no focal deficits, moving all 4 extremities. Psychiatric: Normal mood and affect.  Laboratory Data: Lab Results  Component Value Date   WBC 15.3 (H) 05/24/2020   HGB 14.4 05/24/2020   HCT 39.2 05/24/2020   MCV 95.6 05/24/2020   PLT 209 05/24/2020    Lab Results  Component Value Date   CREATININE 0.90 05/24/2020    Lab Results  Component Value Date   HGBA1C 5.4 07/18/2019    Urinalysis    Component Value Date/Time   COLORURINE YELLOW (A) 02/02/2019 1622   APPEARANCEUR Hazy (A) 10/15/2019 1043   LABSPEC 1.015 02/02/2019 1622   PHURINE 7.0 02/02/2019 1622   GLUCOSEU Negative 10/15/2019 1043   HGBUR SMALL (A) 02/02/2019 1622   BILIRUBINUR Negative 10/15/2019 1043   KETONESUR NEGATIVE 02/02/2019 1622   PROTEINUR Negative 10/15/2019 1043   PROTEINUR 100 (A) 02/02/2019 1622   UROBILINOGEN 0.2 02/13/2019 1358   UROBILINOGEN 0.2 03/31/2015 1233   NITRITE Negative 10/15/2019 1043   NITRITE NEGATIVE 02/02/2019 1622   LEUKOCYTESUR Negative 10/15/2019 1043   LEUKOCYTESUR SMALL (A) 02/02/2019 1622    Lab Results  Component Value Date   LABMICR See below: 10/15/2019   WBCUA 0-5 10/15/2019   RBCUA >30 (H) 04/10/2015   LABEPIT 0-10 10/15/2019   BACTERIA Few 10/15/2019    Pertinent Imaging: CT Renal Stone Study  Narrative CLINICAL DATA:  Right lower abdominal pain with tenderness to palpation  EXAM: CT ABDOMEN AND PELVIS WITHOUT CONTRAST  TECHNIQUE: Multidetector CT imaging of the abdomen and  pelvis was performed following the standard protocol without IV contrast.  COMPARISON:  10/28/2017  FINDINGS: Lower chest:  No contributory findings.  Hepatobiliary: No focal liver abnormality.No evidence of biliary obstruction or stone.  Pancreas: Unremarkable.  Spleen: Unremarkable.  Adrenals/Urinary Tract: Negative adrenals. No hydronephrosis or ureteral stone. At least 6 left renal calculi measuring up to 7 mm at the lower pole. Punctate right upper pole calculus. Unremarkable bladder.  Stomach/Bowel: Dilated and fluid-filled bowel with discrete transition point in the right lower quadrant where there is a knuckle of bowel with adjacent fat stranding that appears twisted with a loop of decompressed bowel. There is a neighboring appendix which is normal. No visible prior intra-abdominal surgery, this could be related to a mesenteric band or internal hernia.  Vascular/Lymphatic: No acute vascular abnormality. No mass or adenopathy.  Reproductive:No pathologic findings.  Other: No ascites or pneumoperitoneum.  Small fatty umbilical hernia  Musculoskeletal: No acute abnormalities.  IMPRESSION: 1. Small bowel obstruction with transition point in the right lower quadrant where there is a knuckle of bowel with mesenteric edema. No visible prior intra-abdominal surgery, question congenital band or internal hernia. 2. Left more than right nephrolithiasis.   Electronically Signed By: Monte Fantasia M.D. On: 05/23/2020 07:12  CT scan personally reviewed, agree with radiologic interpretation as above.  Assessment & Plan:    1. Kidney stone on left side Multiple nonobstructing left-sided stones measuring up to 9 mm intermittently symptomatic  We discussed alternative options including continued surveillance, ureteroscopy versus ESWL.  Given multifocality, would recommend ureteroscopy versus observation.  Risks and benefits of ureteroscopy were reviewed including but  not limited to infection, bleeding, pain, ureteral injury which could require open surgery versus prolonged indwelling if ureteral perforation occurs, persistent stone disease, requirement for staged procedure, possible stent, and global anesthesia risks. Patient expressed understanding and desires to proceed with ureteroscopy.  Given her availability, we will schedule her with my partner, Dr. Bernardo Heater.  I discussed this case with him and he is agreeable.  - Urinalysis, Complete - CULTURE, URINE COMPREHENSIVE  [name], MD  [facility] [address], [place], [place] [address] [phone]

## 2020-08-04 NOTE — Progress Notes (Signed)
08/04/2020 2:19 PM   Brittany Herrera 1971-02-23 [id number]  Referring provider: [name], MD [address] [place],  [place] [address]  Chief Complaint  Patient presents with  . Nephrolithiasis    HPI: 49 year old female with a personal history of recurrent stone disease who presents today to discuss her nonobstructing left lower pole stones.  She is undergone multiple procedures in the past including left PCNL and left ureteroscopy x2.  Her most recent surgical procedure was 2016.  Last year, she was admitted with pyelonephritis without an obstructing stone.  She is very anxious about her stones.  She is been written up for missing work for stone disease.  She occasionally has some twinges in her left flank and is worried about the stones passing.  Most recent imaging in the form of noncontrast CT scan in 04/2020 with stable nonobstructing left lower pole stone burden measuring up to 9 mm x 2.  Minimal right-sided stones.  Essentially unchanged from stones in 2020.  The scans were personally reviewed today.  She does have some time off around the holidays and is wondering if she can have her stone procedure performed during this interval.  PMH: Past Medical History:  Diagnosis Date  . Abnormal Pap smear 2001   [facility]- told her that she had polyps didnt get tx  . Anemia    ONLY WITH PREGNANCY  . DUB (dysfunctional uterine bleeding)    menorrhagia, and bleeding continuously x3 yrs  . Family history of adverse reaction to anesthesia    MOM-HAS HARD TIME WAKING UP  . Kidney stones 2011, 2012  . Staghorn kidney stones     Surgical History: Past Surgical History:  Procedure Laterality Date  . CYSTOSCOPY W/ URETERAL STENT PLACEMENT Left 04/22/2015   Procedure: CYSTOSCOPY WITH STENT REPLACEMENT;  Surgeon: Collier Flowers, MD;  Location: [facility];  Service: Urology;  Laterality: Left;  . CYSTOSCOPY/URETEROSCOPY/HOLMIUM LASER/STENT  PLACEMENT Left 04/08/2015   Procedure: CYSTOSCOPY/URETEROSCOPY/HOLMIUM LASER/STENT PLACEMENT;  Surgeon: [name], MD;  Location: [facility];  Service: Urology;  Laterality: Left;  . LITHOTRIPSY    . URETEROSCOPY WITH HOLMIUM LASER LITHOTRIPSY Left 04/22/2015   Procedure: URETEROSCOPY WITH HOLMIUM LASER LITHOTRIPSY;  Surgeon: Collier Flowers, MD;  Location: [facility];  Service: Urology;  Laterality: Left;  . XI ROBOTIC ASSISTED SMALL BOWEL RESECTION N/A 05/23/2020   Procedure: XI ROBOTIC ASSISTED SMALL BOWEL OBSTRUCTION; LYSIS OF ADHESIONS. INCIDENTAL APPENDECTOMY;  Surgeon: Jules Husbands, MD;  Location: [facility];  Service: General;  Laterality: N/A;    Home Medications:  Allergies as of 08/04/2020   No Known Allergies     Medication List       Accurate as of August 04, 2020  2:19 PM. If you have any questions, ask your nurse or doctor.        STOP taking these medications   ibuprofen 800 MG tablet Commonly known as: ADVIL Stopped by: [name], MD   oxyCODONE 5 MG immediate release tablet Commonly known as: Oxy IR/ROXICODONE Stopped by: [name], MD       Allergies: No Known Allergies  Family History: Family History  Problem Relation Age of Onset  . Cancer Mother 62       breast  . Asthma Mother   . Arthritis Mother   . Cancer Maternal Grandmother 16       breast, colon (pre-cancerous polyps)  . Anemia Maternal Grandmother   . Diabetes Paternal Grandmother   .  Stroke Paternal Grandmother   . Anemia Sister   . Urinary tract infection Sister   . Anemia Daughter   . Heart disease Maternal Grandfather   . COPD Paternal Grandfather   . Anemia Sister   . Urinary tract infection Sister   . Prostate cancer Maternal Uncle     Social History:  reports that she has been smoking cigarettes. She has been smoking about 0.00 packs per day for the past 23.00 years. She has never used smokeless tobacco. She reports current alcohol use of about 4.0 standard drinks of  alcohol per week. She reports previous drug use. Drug: Marijuana.   Physical Exam: BP (!) 144/87   Pulse 80   Wt 171 lb (77.6 kg)   BMI 31.28 kg/m   Constitutional:  Alert and oriented, No acute distress. HEENT: [place] AT, moist mucus membranes.  Trachea midline, no masses. Cardiovascular: No clubbing, cyanosis, or edema. Respiratory: Normal respiratory effort, no increased work of breathing. Skin: No rashes, bruises or suspicious lesions. Neurologic: Grossly intact, no focal deficits, moving all 4 extremities. Psychiatric: Normal mood and affect.  Laboratory Data: Lab Results  Component Value Date   WBC 15.3 (H) 05/24/2020   HGB 14.4 05/24/2020   HCT 39.2 05/24/2020   MCV 95.6 05/24/2020   PLT 209 05/24/2020    Lab Results  Component Value Date   CREATININE 0.90 05/24/2020    Lab Results  Component Value Date   HGBA1C 5.4 07/18/2019    Urinalysis    Component Value Date/Time   COLORURINE YELLOW (A) 02/02/2019 1622   APPEARANCEUR Hazy (A) 10/15/2019 1043   LABSPEC 1.015 02/02/2019 1622   PHURINE 7.0 02/02/2019 1622   GLUCOSEU Negative 10/15/2019 1043   HGBUR SMALL (A) 02/02/2019 1622   BILIRUBINUR Negative 10/15/2019 1043   KETONESUR NEGATIVE 02/02/2019 1622   PROTEINUR Negative 10/15/2019 1043   PROTEINUR 100 (A) 02/02/2019 1622   UROBILINOGEN 0.2 02/13/2019 1358   UROBILINOGEN 0.2 03/31/2015 1233   NITRITE Negative 10/15/2019 1043   NITRITE NEGATIVE 02/02/2019 1622   LEUKOCYTESUR Negative 10/15/2019 1043   LEUKOCYTESUR SMALL (A) 02/02/2019 1622    Lab Results  Component Value Date   LABMICR See below: 10/15/2019   WBCUA 0-5 10/15/2019   RBCUA >30 (H) 04/10/2015   LABEPIT 0-10 10/15/2019   BACTERIA Few 10/15/2019    Pertinent Imaging: CT Renal Stone Study  Narrative CLINICAL DATA:  Right lower abdominal pain with tenderness to palpation  EXAM: CT ABDOMEN AND PELVIS WITHOUT CONTRAST  TECHNIQUE: Multidetector CT imaging of the abdomen and  pelvis was performed following the standard protocol without IV contrast.  COMPARISON:  10/28/2017  FINDINGS: Lower chest:  No contributory findings.  Hepatobiliary: No focal liver abnormality.No evidence of biliary obstruction or stone.  Pancreas: Unremarkable.  Spleen: Unremarkable.  Adrenals/Urinary Tract: Negative adrenals. No hydronephrosis or ureteral stone. At least 6 left renal calculi measuring up to 7 mm at the lower pole. Punctate right upper pole calculus. Unremarkable bladder.  Stomach/Bowel: Dilated and fluid-filled bowel with discrete transition point in the right lower quadrant where there is a knuckle of bowel with adjacent fat stranding that appears twisted with a loop of decompressed bowel. There is a neighboring appendix which is normal. No visible prior intra-abdominal surgery, this could be related to a mesenteric band or internal hernia.  Vascular/Lymphatic: No acute vascular abnormality. No mass or adenopathy.  Reproductive:No pathologic findings.  Other: No ascites or pneumoperitoneum.  Small fatty umbilical hernia  Musculoskeletal: No acute abnormalities.  IMPRESSION: 1. Small bowel obstruction with transition point in the right lower quadrant where there is a knuckle of bowel with mesenteric edema. No visible prior intra-abdominal surgery, question congenital band or internal hernia. 2. Left more than right nephrolithiasis.   Electronically Signed By: Monte Fantasia M.D. On: 05/23/2020 07:12  CT scan personally reviewed, agree with radiologic interpretation as above.  Assessment & Plan:    1. Kidney stone on left side Multiple nonobstructing left-sided stones measuring up to 9 mm intermittently symptomatic  We discussed alternative options including continued surveillance, ureteroscopy versus ESWL.  Given multifocality, would recommend ureteroscopy versus observation.  Risks and benefits of ureteroscopy were reviewed including but  not limited to infection, bleeding, pain, ureteral injury which could require open surgery versus prolonged indwelling if ureteral perforation occurs, persistent stone disease, requirement for staged procedure, possible stent, and global anesthesia risks. Patient expressed understanding and desires to proceed with ureteroscopy.  Given her availability, we will schedule her with my partner, Dr. Bernardo Heater.  I discussed this case with him and he is agreeable.  - Urinalysis, Complete - CULTURE, URINE COMPREHENSIVE  [name], MD  [facility] [address], [place], [place] [address] [phone]

## 2020-08-05 LAB — URINALYSIS, COMPLETE
Bilirubin, UA: NEGATIVE
Glucose, UA: NEGATIVE
Ketones, UA: NEGATIVE
Leukocytes,UA: NEGATIVE
Nitrite, UA: NEGATIVE
Protein,UA: NEGATIVE
RBC, UA: NEGATIVE
Specific Gravity, UA: 1.025 (ref 1.005–1.030)
Urobilinogen, Ur: 0.2 mg/dL (ref 0.2–1.0)
pH, UA: 7 (ref 5.0–7.5)

## 2020-08-05 LAB — MICROSCOPIC EXAMINATION

## 2020-08-06 ENCOUNTER — Other Ambulatory Visit: Payer: Self-pay | Admitting: Radiology

## 2020-08-06 DIAGNOSIS — N200 Calculus of kidney: Secondary | ICD-10-CM

## 2020-08-08 LAB — CULTURE, URINE COMPREHENSIVE

## 2020-08-17 ENCOUNTER — Encounter
Admission: RE | Admit: 2020-08-17 | Discharge: 2020-08-17 | Disposition: A | Discharge status: Home / Self Care | Payer: BC Managed Care – PPO | Source: Ambulatory Visit | Attending: Urology | Admitting: Urology

## 2020-08-17 ENCOUNTER — Other Ambulatory Visit: Payer: BC Managed Care – PPO

## 2020-08-17 ENCOUNTER — Telehealth: Payer: Self-pay | Admitting: Radiology

## 2020-08-17 ENCOUNTER — Other Ambulatory Visit: Payer: Self-pay

## 2020-08-17 DIAGNOSIS — N200 Calculus of kidney: Secondary | ICD-10-CM | POA: Diagnosis not present

## 2020-08-17 LAB — URINALYSIS, COMPLETE
Bilirubin, UA: NEGATIVE
Glucose, UA: NEGATIVE
Ketones, UA: NEGATIVE
Leukocytes,UA: NEGATIVE
Nitrite, UA: NEGATIVE
Protein,UA: NEGATIVE
RBC, UA: NEGATIVE
Specific Gravity, UA: >1.03 — ABNORMAL HIGH (ref 1.005–1.030)
Urobilinogen, Ur: 0.2 mg/dL (ref 0.2–1.0)
pH, UA: 6 (ref 5.0–7.5)

## 2020-08-17 LAB — MICROSCOPIC EXAMINATION: Bacteria, UA: NONE SEEN

## 2020-08-17 NOTE — Telephone Encounter (Signed)
Patient is scheduled for left ureteroscopy on 08/25/2020. She now reports right lower back pain. Please advise.

## 2020-08-17 NOTE — Telephone Encounter (Signed)
Notified patient as advised, patient verbalized understanding.  

## 2020-08-17 NOTE — Telephone Encounter (Signed)
Please contact the patient and inform her that her preop urinalysis is reassuring for stone episode versus urinary tract infection at this time. Additionally, she is noted to have minimal right-sided stone burden. I do not believe she is having a right-sided stone episode. I recommend that we proceed with plans for scheduled ureteroscopy next week.

## 2020-08-17 NOTE — Patient Instructions (Signed)
Your procedure is scheduled on: 08/25/20 Report to [facility]. To find out your arrival time please call [phone] between 1PM - 3PM on 08/24/20.  Remember: Instructions that are not followed completely may result in serious medical risk, up to and including death, or upon the discretion of your surgeon and anesthesiologist your surgery may need to be rescheduled.     _X__ 1. Do not eat food after midnight the night before your procedure.                 No gum chewing or hard candies. You may drink clear liquids up to 2 hours                 before you are scheduled to arrive for your surgery- DO not drink clear                 liquids within 2 hours of the start of your surgery.                 Clear Liquids include:  water, apple juice without pulp, clear carbohydrate                 drink such as Clearfast or Gatorade, Black Coffee or Tea (Do not add                 anything to coffee or tea). Diabetics water only  __X__2.  On the morning of surgery brush your teeth with toothpaste and water, you                 may rinse your mouth with mouthwash if you wish.  Do not swallow any              toothpaste of mouthwash.     _X__ 3.  No Alcohol for 24 hours before or after surgery.   _X__ 4.  Do Not Smoke or use e-cigarettes For 24 Hours Prior to Your Surgery.                 Do not use any chewable tobacco products for at least 6 hours prior to                 surgery.  ____  5.  Bring all medications with you on the day of surgery if instructed.   __X__  6.  Notify your doctor if there is any change in your medical condition      (cold, fever, infections).     Do not wear jewelry, make-up, hairpins, clips or nail polish. Do not wear lotions, powders, or perfumes.  Do not shave 48 hours prior to surgery. Men may shave face and neck. Do not bring valuables to the hospital.    [facility] is not responsible for any belongings  or valuables.  Contacts, dentures/partials or body piercings may not be worn into surgery. Bring a case for your contacts, glasses or hearing aids, a denture cup will be supplied. Leave your suitcase in the car. After surgery it may be brought to your room. For patients admitted to the hospital, discharge time is determined by your treatment team.   Patients discharged the day of surgery will not be allowed to drive home.   Please read over the following fact sheets that you were given:   MRSA Information  __X__ Take these medicines the morning of surgery with A SIP OF WATER:  1. NONE  2.   3.   4.  5.  6.  ____ Fleet Enema (as directed)   ____ Use CHG Soap/SAGE wipes as directed  ____ Use inhalers on the day of surgery  ____ Stop metformin/Janumet/Farxiga 2 days prior to surgery    ____ Take 1/2 of usual insulin dose the night before surgery. No insulin the morning          of surgery.   ____ Stop Blood Thinners Coumadin/Plavix/Xarelto/Pleta/Pradaxa/Eliquis/Effient/Aspirin  on   Or contact your Surgeon, Cardiologist or Medical Doctor regarding  ability to stop your blood thinners  __X__ Stop Anti-inflammatories 7 days before surgery such as Advil, Ibuprofen, Motrin,  BC or Goodies Powder, Naprosyn, Naproxen, Aleve, Aspirin    __X__ Stop all herbal supplements, fish oil or vitamin E until after surgery.    ____ Bring C-Pap to the hospital.

## 2020-08-23 LAB — CULTURE, URINE COMPREHENSIVE

## 2020-08-24 ENCOUNTER — Other Ambulatory Visit: Payer: Self-pay

## 2020-08-24 ENCOUNTER — Other Ambulatory Visit
Admission: RE | Admit: 2020-08-24 | Discharge: 2020-08-24 | Disposition: A | Discharge status: Home / Self Care | Payer: BC Managed Care – PPO | Source: Ambulatory Visit | Attending: Urology | Admitting: Urology

## 2020-08-24 DIAGNOSIS — Z8042 Family history of malignant neoplasm of prostate: Secondary | ICD-10-CM | POA: Diagnosis not present

## 2020-08-24 DIAGNOSIS — Z87442 Personal history of urinary calculi: Secondary | ICD-10-CM | POA: Diagnosis not present

## 2020-08-24 DIAGNOSIS — Z8261 Family history of arthritis: Secondary | ICD-10-CM | POA: Diagnosis not present

## 2020-08-24 DIAGNOSIS — Z20822 Contact with and (suspected) exposure to covid-19: Secondary | ICD-10-CM | POA: Insufficient documentation

## 2020-08-24 DIAGNOSIS — Z01812 Encounter for preprocedural laboratory examination: Secondary | ICD-10-CM | POA: Insufficient documentation

## 2020-08-24 DIAGNOSIS — Z800 Family history of malignant neoplasm of digestive organs: Secondary | ICD-10-CM | POA: Diagnosis not present

## 2020-08-24 DIAGNOSIS — N200 Calculus of kidney: Secondary | ICD-10-CM | POA: Diagnosis not present

## 2020-08-24 DIAGNOSIS — Z825 Family history of asthma and other chronic lower respiratory diseases: Secondary | ICD-10-CM | POA: Diagnosis not present

## 2020-08-24 DIAGNOSIS — F17210 Nicotine dependence, cigarettes, uncomplicated: Secondary | ICD-10-CM | POA: Diagnosis not present

## 2020-08-24 DIAGNOSIS — Z823 Family history of stroke: Secondary | ICD-10-CM | POA: Diagnosis not present

## 2020-08-24 DIAGNOSIS — Z833 Family history of diabetes mellitus: Secondary | ICD-10-CM | POA: Diagnosis not present

## 2020-08-24 DIAGNOSIS — Z803 Family history of malignant neoplasm of breast: Secondary | ICD-10-CM | POA: Diagnosis not present

## 2020-08-25 ENCOUNTER — Ambulatory Visit
Admission: RE | Admit: 2020-08-25 | Discharge: 2020-08-25 | Disposition: A | Discharge status: Home / Self Care | Payer: BC Managed Care – PPO | Attending: Urology | Admitting: Urology

## 2020-08-25 ENCOUNTER — Ambulatory Visit: Payer: BC Managed Care – PPO

## 2020-08-25 ENCOUNTER — Ambulatory Visit: Payer: BC Managed Care – PPO | Admitting: Anesthesiology

## 2020-08-25 ENCOUNTER — Encounter: Payer: Self-pay | Admitting: Urology

## 2020-08-25 ENCOUNTER — Encounter
Admission: RE | Disposition: A | Discharge status: Home / Self Care | Payer: Self-pay | Source: Home / Self Care | Attending: Urology

## 2020-08-25 ENCOUNTER — Other Ambulatory Visit: Payer: Self-pay

## 2020-08-25 DIAGNOSIS — Z20822 Contact with and (suspected) exposure to covid-19: Secondary | ICD-10-CM | POA: Insufficient documentation

## 2020-08-25 DIAGNOSIS — F17210 Nicotine dependence, cigarettes, uncomplicated: Secondary | ICD-10-CM | POA: Insufficient documentation

## 2020-08-25 DIAGNOSIS — Z833 Family history of diabetes mellitus: Secondary | ICD-10-CM | POA: Insufficient documentation

## 2020-08-25 DIAGNOSIS — Z825 Family history of asthma and other chronic lower respiratory diseases: Secondary | ICD-10-CM | POA: Insufficient documentation

## 2020-08-25 DIAGNOSIS — Z87442 Personal history of urinary calculi: Secondary | ICD-10-CM | POA: Diagnosis not present

## 2020-08-25 DIAGNOSIS — Z800 Family history of malignant neoplasm of digestive organs: Secondary | ICD-10-CM | POA: Diagnosis not present

## 2020-08-25 DIAGNOSIS — Z8042 Family history of malignant neoplasm of prostate: Secondary | ICD-10-CM | POA: Insufficient documentation

## 2020-08-25 DIAGNOSIS — Z8261 Family history of arthritis: Secondary | ICD-10-CM | POA: Insufficient documentation

## 2020-08-25 DIAGNOSIS — Z803 Family history of malignant neoplasm of breast: Secondary | ICD-10-CM | POA: Diagnosis not present

## 2020-08-25 DIAGNOSIS — Z823 Family history of stroke: Secondary | ICD-10-CM | POA: Diagnosis not present

## 2020-08-25 DIAGNOSIS — N200 Calculus of kidney: Secondary | ICD-10-CM | POA: Diagnosis not present

## 2020-08-25 DIAGNOSIS — K56609 Unspecified intestinal obstruction, unspecified as to partial versus complete obstruction: Secondary | ICD-10-CM | POA: Diagnosis not present

## 2020-08-25 HISTORY — PX: CYSTOSCOPY/URETEROSCOPY/HOLMIUM LASER/STENT PLACEMENT: SHX6546

## 2020-08-25 LAB — SARS CORONAVIRUS 2 (TAT 6-24 HRS): SARS Coronavirus 2: NEGATIVE

## 2020-08-25 LAB — POCT PREGNANCY, URINE: Preg Test, Ur: NEGATIVE

## 2020-08-25 SURGERY — CYSTOSCOPY/URETEROSCOPY/HOLMIUM LASER/STENT PLACEMENT
Anesthesia: General | Site: Ureter | Laterality: Left

## 2020-08-25 MED ORDER — FAMOTIDINE 20 MG PO TABS: 20.0000 mg | ORAL_TABLET | Freq: Once | ORAL | Status: AC

## 2020-08-25 MED ORDER — PROPOFOL 10 MG/ML IV BOLUS
INTRAVENOUS | Status: AC
  Filled 2020-08-25: qty 20

## 2020-08-25 MED ORDER — ORAL CARE MOUTH RINSE: 15.0000 mL | Freq: Once | OROMUCOSAL | Status: AC

## 2020-08-25 MED ORDER — FENTANYL CITRATE (PF) 100 MCG/2ML IJ SOLN
INTRAMUSCULAR | Status: AC
  Filled 2020-08-25: qty 2

## 2020-08-25 MED ORDER — OXYBUTYNIN CHLORIDE 5 MG PO TABS: ORAL_TABLET | ORAL | 0 refills | Status: DC

## 2020-08-25 MED ORDER — ROCURONIUM BROMIDE 100 MG/10ML IV SOLN
INTRAVENOUS | Status: DC | PRN
  Administered 2020-08-25: 50 mg via INTRAVENOUS
  Administered 2020-08-25: 20 mg via INTRAVENOUS

## 2020-08-25 MED ORDER — DEXAMETHASONE SODIUM PHOSPHATE 10 MG/ML IJ SOLN
INTRAMUSCULAR | Status: DC | PRN
  Administered 2020-08-25: 4 mg via INTRAVENOUS

## 2020-08-25 MED ORDER — CHLORHEXIDINE GLUCONATE 0.12 % MT SOLN: 15.0000 mL | Freq: Once | OROMUCOSAL | Status: AC

## 2020-08-25 MED ORDER — FAMOTIDINE 20 MG PO TABS
ORAL_TABLET | ORAL | Status: AC
  Administered 2020-08-25: 20 mg via ORAL
  Filled 2020-08-25: qty 1

## 2020-08-25 MED ORDER — MIDAZOLAM HCL 2 MG/2ML IJ SOLN
INTRAMUSCULAR | Status: AC
  Filled 2020-08-25: qty 2

## 2020-08-25 MED ORDER — ONDANSETRON HCL 4 MG/2ML IJ SOLN
INTRAMUSCULAR | Status: AC
  Filled 2020-08-25: qty 2

## 2020-08-25 MED ORDER — ONDANSETRON HCL 4 MG/2ML IJ SOLN
INTRAMUSCULAR | Status: DC | PRN
  Administered 2020-08-25: 4 mg via INTRAVENOUS

## 2020-08-25 MED ORDER — PROPOFOL 10 MG/ML IV BOLUS
INTRAVENOUS | Status: DC | PRN
  Administered 2020-08-25: 140 mg via INTRAVENOUS
  Administered 2020-08-25 (×2): 30 mg via INTRAVENOUS

## 2020-08-25 MED ORDER — HYDROCODONE-ACETAMINOPHEN 5-325 MG PO TABS: 1.0000 | ORAL_TABLET | Freq: Four times a day (QID) | ORAL | 0 refills | Status: DC | PRN

## 2020-08-25 MED ORDER — LIDOCAINE HCL (CARDIAC) PF 100 MG/5ML IV SOSY
PREFILLED_SYRINGE | INTRAVENOUS | Status: DC | PRN
  Administered 2020-08-25: 100 mg via INTRAVENOUS

## 2020-08-25 MED ORDER — IOPAMIDOL (ISOVUE-200) INJECTION 41%
INTRAVENOUS | Status: DC | PRN
  Administered 2020-08-25: 5 mL via INTRAVENOUS

## 2020-08-25 MED ORDER — SEVOFLURANE IN SOLN
RESPIRATORY_TRACT | Status: AC
  Filled 2020-08-25: qty 250

## 2020-08-25 MED ORDER — FENTANYL CITRATE (PF) 100 MCG/2ML IJ SOLN: 25.0000 ug | INTRAMUSCULAR | Status: DC | PRN

## 2020-08-25 MED ORDER — CEFAZOLIN SODIUM-DEXTROSE 2-4 GM/100ML-% IV SOLN
2.0000 g | INTRAVENOUS | Status: AC
  Administered 2020-08-25: 2 g via INTRAVENOUS

## 2020-08-25 MED ORDER — CEFAZOLIN SODIUM-DEXTROSE 2-4 GM/100ML-% IV SOLN
INTRAVENOUS | Status: AC
  Filled 2020-08-25: qty 100

## 2020-08-25 MED ORDER — LACTATED RINGERS IV SOLN: INTRAVENOUS | Status: DC

## 2020-08-25 MED ORDER — MIDAZOLAM HCL 2 MG/2ML IJ SOLN
INTRAMUSCULAR | Status: DC | PRN
  Administered 2020-08-25: 2 mg via INTRAVENOUS

## 2020-08-25 MED ORDER — TAMSULOSIN HCL 0.4 MG PO CAPS: 0.4000 mg | ORAL_CAPSULE | Freq: Every day | ORAL | 0 refills | Status: DC

## 2020-08-25 MED ORDER — ROCURONIUM BROMIDE 10 MG/ML (PF) SYRINGE
PREFILLED_SYRINGE | INTRAVENOUS | Status: AC
  Filled 2020-08-25: qty 10

## 2020-08-25 MED ORDER — FENTANYL CITRATE (PF) 100 MCG/2ML IJ SOLN
INTRAMUSCULAR | Status: DC | PRN
  Administered 2020-08-25 (×2): 50 ug via INTRAVENOUS

## 2020-08-25 MED ORDER — DEXAMETHASONE SODIUM PHOSPHATE 10 MG/ML IJ SOLN
INTRAMUSCULAR | Status: AC
  Filled 2020-08-25: qty 1

## 2020-08-25 MED ORDER — ONDANSETRON HCL 4 MG/2ML IJ SOLN: 4.0000 mg | Freq: Once | INTRAMUSCULAR | Status: DC | PRN

## 2020-08-25 MED ORDER — SUGAMMADEX SODIUM 200 MG/2ML IV SOLN
INTRAVENOUS | Status: DC | PRN
  Administered 2020-08-25: 200 mg via INTRAVENOUS

## 2020-08-25 MED ORDER — CHLORHEXIDINE GLUCONATE 0.12 % MT SOLN
OROMUCOSAL | Status: AC
  Administered 2020-08-25: 15 mL via OROMUCOSAL
  Filled 2020-08-25: qty 15

## 2020-08-25 SURGICAL SUPPLY — 34 items
BAG DRAIN CYSTO-URO LG1000N (MISCELLANEOUS) ×2 IMPLANT
BASKET ZERO TIP 1.9FR (BASKET) ×1 IMPLANT
BRUSH SCRUB EZ 1% IODOPHOR (MISCELLANEOUS) ×2 IMPLANT
BSKT STON RTRVL ZERO TP 1.9FR (BASKET) ×1
CATH URET FLEX-TIP 2 LUMEN 10F (CATHETERS) ×1 IMPLANT
CATH URETL 5X70 OPEN END (CATHETERS) IMPLANT
CNTNR SPEC 2.5X3XGRAD LEK (MISCELLANEOUS) ×1
CONT SPEC 4OZ STER OR WHT (MISCELLANEOUS) ×1
CONT SPEC 4OZ STRL OR WHT (MISCELLANEOUS) ×1
CONTAINER SPEC 2.5X3XGRAD LEK (MISCELLANEOUS) IMPLANT
DRAPE UTILITY 15X26 TOWEL STRL (DRAPES) ×2 IMPLANT
GLOVE BIOGEL PI IND STRL 7.5 (GLOVE) ×1 IMPLANT
GLOVE BIOGEL PI INDICATOR 7.5 (GLOVE) ×1
GOWN STRL REUS W/ TWL LRG LVL3 (GOWN DISPOSABLE) ×1 IMPLANT
GOWN STRL REUS W/ TWL XL LVL3 (GOWN DISPOSABLE) ×1 IMPLANT
GOWN STRL REUS W/TWL LRG LVL3 (GOWN DISPOSABLE) ×2
GOWN STRL REUS W/TWL XL LVL3 (GOWN DISPOSABLE) ×2
GUIDEWIRE STR DUAL SENSOR (WIRE) ×2 IMPLANT
INFUSOR MANOMETER BAG 3000ML (MISCELLANEOUS) ×2 IMPLANT
INTRODUCER DILATOR DOUBLE (INTRODUCER) IMPLANT
KIT TURNOVER CYSTO (KITS) ×2 IMPLANT
MANIFOLD NEPTUNE II (INSTRUMENTS) ×1 IMPLANT
PACK CYSTO AR (MISCELLANEOUS) ×2 IMPLANT
SET CYSTO W/LG BORE CLAMP LF (SET/KITS/TRAYS/PACK) ×2 IMPLANT
SHEATH URETERAL 12FRX35CM (MISCELLANEOUS) ×1 IMPLANT
SOL .9 NS 3000ML IRR  AL (IV SOLUTION) ×2
SOL .9 NS 3000ML IRR AL (IV SOLUTION) ×1
SOL .9 NS 3000ML IRR UROMATIC (IV SOLUTION) ×1 IMPLANT
STENT URET 6FRX24 CONTOUR (STENTS) ×1 IMPLANT
STENT URET 6FRX26 CONTOUR (STENTS) IMPLANT
SURGILUBE 2OZ TUBE FLIPTOP (MISCELLANEOUS) ×2 IMPLANT
TRACTIP FLEXIVA PULSE ID 200 (Laser) ×3 IMPLANT
VALVE UROSEAL ADJ ENDO (VALVE) ×1 IMPLANT
WATER STERILE IRR 1000ML POUR (IV SOLUTION) ×2 IMPLANT

## 2020-08-25 NOTE — Anesthesia Procedure Notes (Signed)
Procedure Name: Intubation Date/Time: 08/25/2020 10:27 AM Performed by: Babs Sciara, CRNA Pre-anesthesia Checklist: Suction available, Emergency Drugs available, Patient identified, Patient being monitored and Timeout performed Patient Re-evaluated:Patient Re-evaluated prior to induction Oxygen Delivery Method: Circle system utilized Preoxygenation: Pre-oxygenation with 100% oxygen Induction Type: IV induction Ventilation: Mask ventilation without difficulty Laryngoscope Size: Mac and 3 Grade View: Grade I Tube type: Oral Tube size: 7.0 mm Number of attempts: 1 Airway Equipment and Method: Stylet Placement Confirmation: ETT inserted through vocal cords under direct vision,  positive ETCO2 and breath sounds checked- equal and bilateral Secured at: 21 cm Tube secured with: Tape (lip) Dental Injury: Teeth and Oropharynx as per pre-operative assessment

## 2020-08-25 NOTE — Transfer of Care (Signed)
Immediate Anesthesia Transfer of Care Note  Patient: Brittany Herrera  Procedure(s) Performed: CYSTOSCOPY/URETEROSCOPY/HOLMIUM LASER/STENT PLACEMENT (Left Ureter)  Patient Location: PACU  Anesthesia Type:General  Level of Consciousness: awake, alert  and oriented  Airway & Oxygen Therapy: Patient Spontanous Breathing and Patient connected to face mask oxygen  Post-op Assessment: Report given to RN and Post -op Vital signs reviewed and stable  Post vital signs: Reviewed and stable  Last Vitals:  Vitals Value Taken Time  BP 141/78 08/25/20 1205  Temp 36.8 C 08/25/20 1204  Pulse 52 08/25/20 1208  Resp 29 08/25/20 1208  SpO2 100 % 08/25/20 1208  Vitals shown include unvalidated device data.  Last Pain:  Vitals:   08/25/20 1204  TempSrc:   PainSc: Asleep      Patients Stated Pain Goal: 0 (08/25/20 0919)  Complications: No complications documented.

## 2020-08-25 NOTE — Interval H&P Note (Signed)
History and Physical Interval Note: I discussed the procedure in detail including the possibility of a staged procedure or inability to treat the renal calculi secondary to anatomy with inability to access the kidney.  All questions were answered and she desires to proceed.  CV: RRR Lungs: Clear  08/25/2020 10:18 AM  Brittany Herrera  has presented today for surgery, with the diagnosis of left renal stone.  The various methods of treatment have been discussed with the patient and family. After consideration of risks, benefits and other options for treatment, the patient has consented to  Procedure(s): CYSTOSCOPY/URETEROSCOPY/HOLMIUM LASER/STENT PLACEMENT (Left) as a surgical intervention.  The patient's history has been reviewed, patient examined, no change in status, stable for surgery.  I have reviewed the patient's chart and labs.  Questions were answered to the patient's satisfaction.     Brittany Herrera C Amina Menchaca

## 2020-08-25 NOTE — Anesthesia Preprocedure Evaluation (Signed)
Anesthesia Evaluation  Patient identified by MRN, date of birth, ID band Patient awake    Reviewed: Allergy & Precautions, H&P , NPO status , Patient's Chart, lab work & pertinent test results, reviewed documented beta blocker date and time   Airway Mallampati: I  TM Distance: >3 FB Neck ROM: full    Dental  (+) Teeth Intact   Pulmonary neg pulmonary ROS, Current SmokerPatient did not abstain from smoking.,    Pulmonary exam normal        Cardiovascular Exercise Tolerance: Good negative cardio ROS Normal cardiovascular exam Rhythm:regular Rate:Normal     Neuro/Psych Anxiety negative neurological ROS  negative psych ROS   GI/Hepatic negative GI ROS, Neg liver ROS,   Endo/Other  negative endocrine ROS  Renal/GU Renal disease  negative genitourinary   Musculoskeletal   Abdominal   Peds  Hematology  (+) Blood dyscrasia, anemia ,   Anesthesia Other Findings Past Medical History: 2001: Abnormal Pap smear     Comment:  [facility]- told her that she had polyps didnt               get tx No date: Anemia     Comment:  ONLY WITH PREGNANCY No date: DUB (dysfunctional uterine bleeding)     Comment:  menorrhagia, and bleeding continuously x3 yrs No date: Family history of adverse reaction to anesthesia     Comment:  MOM-HAS HARD TIME WAKING UP 2011, 2012: Kidney stones No date: Stag[place] kidney stones Past Surgical History: 04/22/2015: CYSTOSCOPY W/ URETERAL STENT PLACEMENT; Left     Comment:  Procedure: CYSTOSCOPY WITH STENT REPLACEMENT;  Surgeon:               Lorraine Lax, MD;  Location: [facility] ORS;  Service:               Urology;  Laterality: Left; 04/08/2015: CYSTOSCOPY/URETEROSCOPY/HOLMIUM LASER/STENT PLACEMENT; Left     Comment:  Procedure: CYSTOSCOPY/URETEROSCOPY/HOLMIUM LASER/STENT               PLACEMENT;  Surgeon: Vanna Scotland, MD;  Location: [facility]               ORS;  Service: Urology;  Laterality:  Left; No date: LITHOTRIPSY 04/22/2015: URETEROSCOPY WITH HOLMIUM LASER LITHOTRIPSY; Left     Comment:  Procedure: URETEROSCOPY WITH HOLMIUM LASER LITHOTRIPSY;               Surgeon: Lorraine Lax, MD;  Location: [facility] ORS;                Service: Urology;  Laterality: Left; 05/23/2020: XI ROBOTIC ASSISTED SMALL BOWEL RESECTION; N/A     Comment:  Procedure: XI ROBOTIC ASSISTED SMALL BOWEL OBSTRUCTION;               LYSIS OF ADHESIONS. INCIDENTAL APPENDECTOMY;  Surgeon:               Leafy Ro, MD;  Location: [facility] ORS;  Service:               General;  Laterality: N/A; BMI    Body Mass Index: 31.09 kg/m     Reproductive/Obstetrics negative OB ROS                             Anesthesia Physical Anesthesia Plan  ASA: II  Anesthesia Plan: General ETT   Post-op Pain Management:    Induction:   PONV Risk Score  and Plan: 3  Airway Management Planned:   Additional Equipment:   Intra-op Plan:   Post-operative Plan:   Informed Consent: I have reviewed the patients History and Physical, chart, labs and discussed the procedure including the risks, benefits and alternatives for the proposed anesthesia with the patient or authorized representative who has indicated his/her understanding and acceptance.     Dental Advisory Given  Plan Discussed with: CRNA  Anesthesia Plan Comments:         Anesthesia Quick Evaluation

## 2020-08-25 NOTE — Discharge Instructions (Signed)
AMBULATORY SURGERY  DISCHARGE INSTRUCTIONS  1) The drugs that you were given will stay in your system until tomorrow so for the next 24 hours you should not: A) Drive an automobile B) Make any legal decisions C) Drink any alcoholic beverage  2) You may resume regular meals tomorrow.  Today it is better to start with liquids and gradually work up to solid foods. You may eat anything you prefer, but it is better to start with liquids, then soup and crackers, and gradually work up to solid foods.  3) Please notify your doctor immediately if you have any unusual bleeding, trouble breathing, redness and pain at the surgery site, drainage, fever, or pain not relieved by medication.  4) Additional Instructions:  Please contact your physician with any problems or Same Day Surgery at [phone], Monday through Friday 6 am to 4 pm, or [place] [facility] number at [phone].   DISCHARGE INSTRUCTIONS FOR KIDNEY STONE/URETERAL STENT   MEDICATIONS:  1. Resume all your other meds from home.  2.  AZO (over-the-counter) can help with the burning/stinging when you urinate. 3.  Hydrocodone is for moderate/severe pain, Rx was sent to your pharmacy. 4.  Oxybutynin and tamsulosin are for bladder/stent irritation.  Prescriptions were sent to pharmacy  ACTIVITY:  1. May resume regular activities in 24 hours. 2. No driving while on narcotic pain medications  3. Drink plenty of water  4. Continue to walk at home - you can still get blood clots when you are at home, so keep active, but don't over do it.  5. May return to work/school tomorrow or when you feel ready    SIGNS/SYMPTOMS TO CALL:  Please call us if you have a fever greater than 101.5, uncontrolled nausea/vomiting, uncontrolled pain, dizziness, unable to urinate, excessively bloody urine, chest pain, shortness of breath, leg swelling, leg pain, or any other concerns or questions.   Common postoperative symptoms include urinary  frequency, urgency, bladder spasm and blood in urine  You can reach [place] at [phone].   FOLLOW-UP:  1. You we will be contacted by our office for an appointment for stent removal in 7-10 days

## 2020-08-25 NOTE — Op Note (Signed)
Preoperative diagnosis:  1.  Left nephrolithiasis   Postoperative diagnosis:  1.  Left nephrolithiasis  Procedure:  1. Cystoscopy 2. Left ureteroscopy and stone removal 3. Ureteroscopic laser lithotripsy 4. Left ureteral stent placement (6FR/24 cm) 5. Left retrograde pyelography with interpretation  Surgeon: Nicki Reaper C. Ayeshia Coppin, M.D.  Anesthesia: General  Complications: None  Intraoperative findings:  1.  Cystoscopy-normal appearing bladder mucosa without erythema, solid or papillary lesions 2.  Ureteroscopy-normal ureteral mucosa without stricture or lesions 3.  Pyeloscopy-multiple Randall's plaques noted; 5 lower calyceal calculi 4.  Left retrograde pyelography post procedure showed no filling defects, stone fragments or contrast extravasation  EBL: Minimal  Specimens: 1. Calculus fragments for analysis   Indication: Brittany Herrera is a 49 y.o. female with multiple lower pole nonobstructing left renal calculi.  Due to the number and size of the calculi she desires treatment end ureteroscopy was felt to be the best option.  After reviewing the management options for treatment, the patient elected to proceed with the above surgical procedure(s). We have discussed the potential benefits and risks of the procedure, side effects of the proposed treatment, the likelihood of the patient achieving the goals of the procedure, and any potential problems that might occur during the procedure or recuperation. Informed consent has been obtained.  Description of procedure:  The patient was taken to the operating room and general anesthesia was induced.  The patient was placed in the dorsal lithotomy position, prepped and draped in the usual sterile fashion, and preoperative antibiotics were administered. A preoperative time-out was performed.   A 21 French cystoscope was lubricated and passed per urethra.  Panendoscopy was performed with findings as described above.  Attention was  directed to the left ureteral orifice and a 0.038 Sensor wire was advanced up the ureter into the renal pelvis under fluoroscopic guidance.  The cystoscope was removed and a dual-lumen catheter was placed over the sensor wire and a second sensor wire was placed in a similar fashion.  A 12/14 French ureteral access sheath was placed over the working wire under fluoroscopic guidance without difficulty.  A digital flexible ureteroscope was placed through the access sheath and advanced into the renal pelvis without difficulty.  Pyeloscopy was performed with findings as described above.  A lower calyceal calculus estimated at 7 mm was placed in the basket and relocated to an upper pole calyx.  A 200 m holmium laser fiber was then placed with ureteroscope and the stone was dusted with pedal settings of 0.2 J / 40 Hz and 0.4 J / 40 Hz.  Once dusted down to a small size the remainder was placed in the 1.9 [place] basket and removed.  Three lower calyceal calculi were able to be removed by basketing alone.  An anterior lower calyceal calculus estimated at approximately 7 mm was placed in a basket and relocated to the upper pole calyx and dusted and removed in a similar fashion.  Retrograde pyelogram was performed through the ureteroscope with findings as described above.  All calyces were examined and only minute particles estimated at <1 mm were identified.  The ureteral access sheath and ureteroscope were removed in tandem and the ureter showed no evidence of injury or perforation.  A 6 FR/24 CM Contour ureteral stent was placed under fluoroscopic guidance.  The wire was then removed with the distal stent tip in an upper pole calyx and a good curl stent curl noted in the bladder.  The bladder was then emptied and the procedure  ended.  The patient appeared to tolerate the procedure well and without complications.  After anesthetic reversal the patient was transported to the PACU in stable condition.    Plan:  Office follow-up 7-10 days for cystoscopy with stent removal   Irineo Axon, MD

## 2020-08-26 ENCOUNTER — Encounter: Payer: Self-pay | Admitting: Urology

## 2020-08-26 NOTE — Anesthesia Postprocedure Evaluation (Signed)
Anesthesia Post Note  Patient: TAHISHA HAKIM  Procedure(s) Performed: CYSTOSCOPY/URETEROSCOPY/HOLMIUM LASER/STENT PLACEMENT (Left Ureter)  Patient location during evaluation: PACU Anesthesia Type: General Level of consciousness: awake and alert Pain management: pain level controlled Vital Signs Assessment: post-procedure vital signs reviewed and stable Respiratory status: spontaneous breathing, nonlabored ventilation, respiratory function stable and patient connected to nasal cannula oxygen Cardiovascular status: blood pressure returned to baseline and stable Postop Assessment: no apparent nausea or vomiting Anesthetic complications: no   No complications documented.   Last Vitals:  Vitals:   08/25/20 1219 08/25/20 1232  BP: (!) 144/94 (!) 143/89  Pulse: 88 72  Resp: 17 16  Temp: 36.9 C 36.6 C  SpO2: 97% 100%    Last Pain:  Vitals:   08/26/20 0759  TempSrc:   PainSc: 2                  Yevette Edwards

## 2020-09-01 LAB — CALCULI, WITH PHOTOGRAPH (CLINICAL LAB)
Calcium Oxalate Dihydrate: 20 %
Calcium Oxalate Monohydrate: 60 %
Hydroxyapatite: 20 %
Weight Calculi: 111 mg

## 2020-09-04 ENCOUNTER — Ambulatory Visit (INDEPENDENT_AMBULATORY_CARE_PROVIDER_SITE_OTHER): Payer: BC Managed Care – PPO | Admitting: Urology

## 2020-09-04 ENCOUNTER — Other Ambulatory Visit: Payer: Self-pay

## 2020-09-04 ENCOUNTER — Encounter: Payer: Self-pay | Admitting: Urology

## 2020-09-04 DIAGNOSIS — N200 Calculus of kidney: Secondary | ICD-10-CM | POA: Diagnosis not present

## 2020-09-04 LAB — URINALYSIS, COMPLETE
Bilirubin, UA: NEGATIVE
Glucose, UA: NEGATIVE
Ketones, UA: NEGATIVE
Nitrite, UA: NEGATIVE
RBC, UA: NEGATIVE
Specific Gravity, UA: 1.025 (ref 1.005–1.030)
Urobilinogen, Ur: 0.2 mg/dL (ref 0.2–1.0)
pH, UA: 5.5 (ref 5.0–7.5)

## 2020-09-04 LAB — MICROSCOPIC EXAMINATION: Bacteria, UA: NONE SEEN

## 2020-09-04 MED ORDER — CIPROFLOXACIN HCL 500 MG PO TABS
500.0000 mg | ORAL_TABLET | Freq: Once | ORAL | Status: AC
  Administered 2020-09-04: 500 mg via ORAL

## 2020-09-04 NOTE — Progress Notes (Signed)
Indications: Patient is 50 y.o., who is s/p ureteroscopic removal of multiple left renal calculi on 08/25/2020.  He had no postoperative problems.  The patient is presenting today for stent removal.  Procedure:  Flexible Cystoscopy with stent removal (26415)  Timeout was performed and the correct patient, procedure and participants were identified.    Description:  The patient was prepped and draped in the usual sterile fashion. Flexible cystosopy was performed.  The stent was visualized, grasped, and removed intact without difficulty. The patient tolerated the procedure well.  A single dose of oral antibiotics was given.  Complications:  None  Plan:   Instructed to call for fever or flank pain post stent removal  She has a PA follow-up later this month and is scheduled for follow-up with Dr. Erlene Quan in February  Denies previous metabolic evaluation and she would like to get this scheduled at her next visit   John Giovanni, MD

## 2020-09-06 ENCOUNTER — Encounter: Payer: Self-pay | Admitting: Urology

## 2020-09-17 ENCOUNTER — Ambulatory Visit: Payer: BC Managed Care – PPO

## 2020-09-21 ENCOUNTER — Telehealth: Payer: Self-pay

## 2020-09-21 NOTE — Telephone Encounter (Signed)
ERROR

## 2020-09-22 ENCOUNTER — Ambulatory Visit: Payer: BC Managed Care – PPO | Admitting: Physician Assistant

## 2020-09-22 ENCOUNTER — Encounter: Payer: Self-pay | Admitting: Physician Assistant

## 2020-10-20 ENCOUNTER — Ambulatory Visit: Payer: Self-pay | Admitting: Urology

## 2020-10-21 ENCOUNTER — Encounter: Payer: Self-pay | Admitting: Urology

## 2022-03-16 ENCOUNTER — Emergency Department: Payer: BC Managed Care – PPO

## 2022-03-16 ENCOUNTER — Emergency Department
Admission: EM | Admit: 2022-03-16 | Discharge: 2022-03-16 | Disposition: A | Discharge status: Home / Self Care | Payer: BC Managed Care – PPO | Attending: Emergency Medicine | Admitting: Emergency Medicine

## 2022-03-16 ENCOUNTER — Other Ambulatory Visit: Payer: Self-pay

## 2022-03-16 ENCOUNTER — Encounter: Payer: Self-pay | Admitting: Emergency Medicine

## 2022-03-16 DIAGNOSIS — Y9389 Activity, other specified: Secondary | ICD-10-CM | POA: Diagnosis not present

## 2022-03-16 DIAGNOSIS — X58XXXA Exposure to other specified factors, initial encounter: Secondary | ICD-10-CM | POA: Insufficient documentation

## 2022-03-16 DIAGNOSIS — S46211A Strain of muscle, fascia and tendon of other parts of biceps, right arm, initial encounter: Secondary | ICD-10-CM | POA: Diagnosis not present

## 2022-03-16 DIAGNOSIS — S4991XA Unspecified injury of right shoulder and upper arm, initial encounter: Secondary | ICD-10-CM | POA: Diagnosis not present

## 2022-03-16 DIAGNOSIS — M25511 Pain in right shoulder: Secondary | ICD-10-CM | POA: Diagnosis not present

## 2022-03-16 MED ORDER — TRAMADOL HCL 50 MG PO TABS: 50.0000 mg | ORAL_TABLET | Freq: Four times a day (QID) | ORAL | 0 refills | Status: DC | PRN

## 2022-03-16 MED ORDER — NAPROXEN 500 MG PO TABS: 500.0000 mg | ORAL_TABLET | Freq: Two times a day (BID) | ORAL | 0 refills | Status: DC

## 2022-03-16 NOTE — ED Provider Notes (Signed)
[facility] Provider Note    Event Date/Time   First MD Initiated Contact with Patient 03/16/22 1612     (approximate)   History   Shoulder Injury   HPI  Brittany Herrera is a 51 y.o. female  presents to the emergency department for treatment and evaluation of right shoulder pain. Injury occurred while playing with her grandchildren this afternoon. No alleviating measures prior to arrival. No previous shoulder surgeries.   Past Medical History:  Diagnosis Date   Abnormal Pap smear 2001   [facility]- told her that she had polyps didnt get tx   Anemia    ONLY WITH PREGNANCY   DUB (dysfunctional uterine bleeding)    menorrhagia, and bleeding continuously x3 yrs   Family history of adverse reaction to anesthesia    [place] [facility]   Kidney stones 2011, 2012   Staghorn kidney stones      Physical Exam   Triage Vital Signs: ED Triage Vitals  Enc Vitals Group     BP 03/16/22 1550 129/86     Pulse Rate 03/16/22 1550 69     Resp 03/16/22 1550 18     Temp 03/16/22 1550 97.9 F (36.6 C)     Temp Source 03/16/22 1550 Oral     SpO2 03/16/22 1550 99 %     Weight 03/16/22 1526 169 lb 15.6 oz (77.1 kg)     Height 03/16/22 1526 '5\' 2"'$  (1.575 m)     Head Circumference --      Peak Flow --      Pain Score 03/16/22 1525 5     Pain Loc --      Pain Edu? --      Excl. in [place]? --     Most recent vital signs: Vitals:   03/16/22 1550  BP: 129/86  Pulse: 69  Resp: 18  Temp: 97.9 F (36.6 C)  SpO2: 99%    General: Awake, no distress.  CV:  Good peripheral perfusion.  Resp:  Normal effort.  Abd:  No distention.  Other:  Pain in right bicep increases with abduction of arm.    ED Results / Procedures / Treatments   Labs (all labs ordered are listed, but only abnormal results are displayed) Labs Reviewed - No data to display   EKG  Not indicated.   RADIOLOGY  Image of the right shoulder negative for acute  concerns.  I have independently reviewed and interpreted imaging as well as reviewed report from radiology.  PROCEDURES:  Critical Care performed: No  Procedures   MEDICATIONS ORDERED IN ED:  Medications - No data to display   IMPRESSION / MDM / [place] / ED COURSE   I reviewed the triage vital signs and the nursing notes.  Differential diagnosis includes, but is not limited to: Shoulder strain, proximal humerus fracture, bicep tendon injury  Patient's presentation is most consistent with acute complicated illness / injury requiring diagnostic workup.  51 year old female presenting to the emergency department for treatment and evaluation after right shoulder injury.  See HPI for further details.  X-ray of the shoulder is negative for acute concerns.  Results of the imaging discussed with the patient who feels reassured.  Plan will be to have her wear a sling for comfort.  Prescription for Naprosyn and tramadol submitted to the patient's pharmacy.  She was encouraged to follow-up with her primary care provider if not improving over the week.  If  symptoms change or worsen and she is unable to schedule an appointment, she is to return to the emergency department.      FINAL CLINICAL IMPRESSION(S) / ED DIAGNOSES   Final diagnoses:  Biceps muscle strain, right, initial encounter     Rx / DC Orders   ED Discharge Orders          Ordered    traMADol (ULTRAM) 50 MG tablet  Every 6 hours PRN        03/16/22 1702    naproxen (NAPROSYN) 500 MG tablet  2 times daily with meals        03/16/22 1702             Note:  This document was prepared using Dragon voice recognition software and may include unintentional dictation errors.   Victorino Dike, FNP 03/16/22 1828    Blake Divine, MD 03/17/22 1105

## 2022-03-16 NOTE — ED Triage Notes (Signed)
C/O injury to right shoulder while playing with grand children

## 2022-04-14 ENCOUNTER — Other Ambulatory Visit: Payer: Self-pay

## 2022-04-14 ENCOUNTER — Ambulatory Visit
Admission: EM | Admit: 2022-04-14 | Discharge: 2022-04-14 | Disposition: A | Discharge status: Home / Self Care | Payer: BC Managed Care – PPO | Attending: Emergency Medicine | Admitting: Emergency Medicine

## 2022-04-14 ENCOUNTER — Encounter: Payer: Self-pay | Admitting: Emergency Medicine

## 2022-04-14 DIAGNOSIS — R82998 Other abnormal findings in urine: Secondary | ICD-10-CM | POA: Diagnosis not present

## 2022-04-14 DIAGNOSIS — N898 Other specified noninflammatory disorders of vagina: Secondary | ICD-10-CM

## 2022-04-14 DIAGNOSIS — N760 Acute vaginitis: Secondary | ICD-10-CM | POA: Insufficient documentation

## 2022-04-14 DIAGNOSIS — F17210 Nicotine dependence, cigarettes, uncomplicated: Secondary | ICD-10-CM | POA: Diagnosis not present

## 2022-04-14 DIAGNOSIS — R8290 Unspecified abnormal findings in urine: Secondary | ICD-10-CM

## 2022-04-14 DIAGNOSIS — Z87442 Personal history of urinary calculi: Secondary | ICD-10-CM | POA: Insufficient documentation

## 2022-04-14 LAB — POCT URINALYSIS DIP (MANUAL ENTRY)
Bilirubin, UA: NEGATIVE
Glucose, UA: NEGATIVE mg/dL
Ketones, POC UA: NEGATIVE mg/dL
Nitrite, UA: NEGATIVE
Protein Ur, POC: NEGATIVE mg/dL
Spec Grav, UA: 1.01 (ref 1.010–1.025)
Urobilinogen, UA: 0.2 E.U./dL
pH, UA: 6 (ref 5.0–8.0)

## 2022-04-14 LAB — POCT URINE PREGNANCY: Preg Test, Ur: NEGATIVE

## 2022-04-14 MED ORDER — FLUCONAZOLE 150 MG PO TABS: 150.0000 mg | ORAL_TABLET | Freq: Every day | ORAL | 0 refills | Status: DC

## 2022-04-14 NOTE — ED Triage Notes (Addendum)
Patient reports feeling tired for 3 months.  Reports urine is intermittently dark and sometimes cloudy and an odor for a month.  Low back pain, too

## 2022-04-14 NOTE — ED Provider Notes (Signed)
Roderic Palau    CSN: [id number] Arrival date & time: 04/14/22  1527      History   Chief Complaint Chief Complaint  Patient presents with   Urinary Tract Infection    HPI Brittany Herrera is a 51 y.o. female.  Patient presents with cloudy malodorous urine, vaginal discharge, vaginal itching x1 month.  She denies fever, chills, rash, abdominal pain, dysuria, hematuria, pelvic pain, vomiting, diarrhea, constipation, or other symptoms.  No treatments at home.  Her medical history includes kidney stones.   The history is provided by the patient and medical records.    Past Medical History:  Diagnosis Date   Abnormal Pap smear 2001   [facility]- told her that she had polyps didnt get tx   Anemia    ONLY WITH PREGNANCY   DUB (dysfunctional uterine bleeding)    menorrhagia, and bleeding continuously x3 yrs   Family history of adverse reaction to anesthesia    [place] [facility]   Kidney stones 2011, 2012   [place] kidney stones     Patient Active Problem List   Diagnosis Date Noted   SBO (small bowel obstruction) ([place]) 05/23/2020   Hyperglycemia 07/18/2019   Class 1 obesity without serious comorbidity with body mass index (BMI) of 31.0 to 31.9 in adult 07/18/2019   Menorrhagia 07/13/2018   Encounter for counseling regarding contraception 04/11/2018   Tobacco use disorder 04/11/2018   Family history of breast cancer 04/11/2018   Recurrent nephrolithiasis 03/24/2015   Anxiety 03/21/2013    Past Surgical History:  Procedure Laterality Date   CYSTOSCOPY W/ URETERAL STENT PLACEMENT Left 04/22/2015   Procedure: CYSTOSCOPY WITH STENT REPLACEMENT;  Surgeon: Collier Flowers, MD;  Location: [facility];  Service: Urology;  Laterality: Left;   CYSTOSCOPY/URETEROSCOPY/HOLMIUM LASER/STENT PLACEMENT Left 04/08/2015   Procedure: CYSTOSCOPY/URETEROSCOPY/HOLMIUM LASER/STENT PLACEMENT;  Surgeon: [name], MD;  Location: [facility];  Service: Urology;   Laterality: Left;   CYSTOSCOPY/URETEROSCOPY/HOLMIUM LASER/STENT PLACEMENT Left 08/25/2020   Procedure: CYSTOSCOPY/URETEROSCOPY/HOLMIUM LASER/STENT PLACEMENT;  Surgeon: Abbie Sons, MD;  Location: [facility];  Service: Urology;  Laterality: Left;   LITHOTRIPSY     URETEROSCOPY WITH HOLMIUM LASER LITHOTRIPSY Left 04/22/2015   Procedure: URETEROSCOPY WITH HOLMIUM LASER LITHOTRIPSY;  Surgeon: Collier Flowers, MD;  Location: [facility];  Service: Urology;  Laterality: Left;   XI ROBOTIC ASSISTED SMALL BOWEL RESECTION N/A 05/23/2020   Procedure: XI ROBOTIC ASSISTED SMALL BOWEL OBSTRUCTION; LYSIS OF ADHESIONS. INCIDENTAL APPENDECTOMY;  Surgeon: Jules Husbands, MD;  Location: [facility];  Service: General;  Laterality: N/A;    OB History     Gravida  1   Para  1   Term      Preterm  1   AB  0   Living  1      SAB  0   IAB  0   Ectopic  0   Multiple  0   Live Births  1            Home Medications    Prior to Admission medications   Medication Sig Start Date End Date Taking? Authorizing Provider  fluconazole (DIFLUCAN) 150 MG tablet Take 1 tablet (150 mg total) by mouth daily. Take one tablet today.  May repeat in 3 days. 04/14/22  Yes Sharion Balloon, NP  ibuprofen (ADVIL) 200 MG tablet Take 400 mg by mouth every 6 (six) hours as needed for headache or moderate pain. Patient not taking: Reported on 04/14/2022  [provider]  naproxen (NAPROSYN) 500 MG tablet Take 1 tablet (500 mg total) by mouth 2 (two) times daily with a meal. Patient not taking: Reported on 04/14/2022 03/16/22   Sherrie George B, FNP  traMADol (ULTRAM) 50 MG tablet Take 1 tablet (50 mg total) by mouth every 6 (six) hours as needed. Patient not taking: Reported on 04/14/2022 03/16/22   Victorino Dike, FNP    Family History Family History  Problem Relation Age of Onset   Cancer Mother 70       breast   Asthma Mother    Arthritis Mother    Anemia Sister    Urinary tract infection Sister     Anemia Sister    Urinary tract infection Sister    Cancer Maternal Grandmother 84       breast, colon (pre-cancerous polyps)   Anemia Maternal Grandmother    Heart disease Maternal Grandfather    Diabetes Paternal Grandmother    Stroke Paternal Grandmother    COPD Paternal Grandfather    Anemia Daughter    Prostate cancer Maternal Uncle     Social History Social History   Tobacco Use   Smoking status: Some Days    Packs/day: 1.00    Years: 23.00    Total pack years: 23.00    Types: Cigarettes   Smokeless tobacco: Never   Tobacco comments:    smokes 3 cigarettes per week  Vaping Use   Vaping Use: Former  Substance Use Topics   Alcohol use: Not Currently    Alcohol/week: 4.0 standard drinks of alcohol    Types: 4 Shots of liquor per week   Drug use: Yes    Types: Marijuana    Comment: patient no longer smoking     Allergies   Patient has no known allergies.   Review of Systems Review of Systems  Constitutional:  Negative for chills and fever.  Gastrointestinal:  Negative for abdominal pain, constipation, diarrhea and vomiting.  Genitourinary:  Positive for vaginal discharge. Negative for dysuria, flank pain, hematuria and pelvic pain.  Skin:  Negative for color change and rash.  All other systems reviewed and are negative.    Physical Exam Triage Vital Signs ED Triage Vitals  Enc Vitals Group     BP      Pulse      Resp      Temp      Temp src      SpO2      Weight      Height      Head Circumference      Peak Flow      Pain Score      Pain Loc      Pain Edu?      Excl. in [place]?    No data found.  Updated Vital Signs BP 120/78 (BP Location: Right Arm)   Pulse 68   Temp 98.6 F (37 C) (Oral)   Resp 18   SpO2 98%   Visual Acuity Right Eye Distance:   Left Eye Distance:   Bilateral Distance:    Right Eye Near:   Left Eye Near:    Bilateral Near:     Physical Exam Vitals and nursing note reviewed.  Constitutional:      General: She  is not in acute distress.    Appearance: Normal appearance. She is well-developed. She is not ill-appearing.  HENT:     Mouth/Throat:     Mouth: Mucous membranes are  moist.  Eyes:     Conjunctiva/sclera: Conjunctivae normal.  Cardiovascular:     Rate and Rhythm: Normal rate and regular rhythm.     Heart sounds: Normal heart sounds.  Pulmonary:     Effort: Pulmonary effort is normal. No respiratory distress.     Breath sounds: Normal breath sounds.  Abdominal:     General: Bowel sounds are normal.     Palpations: Abdomen is soft.     Tenderness: There is no abdominal tenderness. There is no right CVA tenderness, left CVA tenderness, guarding or rebound.  Musculoskeletal:     Cervical back: Neck supple.  Skin:    General: Skin is warm and dry.  Neurological:     Mental Status: She is alert.  Psychiatric:        Mood and Affect: Mood normal.        Behavior: Behavior normal.      UC Treatments / Results  Labs (all labs ordered are listed, but only abnormal results are displayed) Labs Reviewed  POCT URINALYSIS DIP (MANUAL ENTRY) - Abnormal; Notable for the following components:      Result Value   Blood, UA trace-intact (*)    Leukocytes, UA Trace (*)    All other components within normal limits  URINE CULTURE  POCT URINE PREGNANCY  CERVICOVAGINAL ANCILLARY ONLY    EKG   Radiology No results found.  Procedures Procedures (including critical care time)  Medications Ordered in UC Medications - No data to display  Initial Impression / Assessment and Plan / UC Course  I have reviewed the triage vital signs and the nursing notes.  Pertinent labs & imaging results that were available during my care of the patient were reviewed by me and considered in my medical decision making (see chart for details).    Malodorous urine, vaginal discharge.  Urine culture pending.  Urine pregnancy negative.  Patient obtained vaginal self swab for testing.  Treating with Diflucan.   Discussed that we will call if test results are positive.  Discussed that she may require additional treatment at that time.  Discussed that sexual partner(s) may also require treatment.  Instructed patient to abstain from sexual activity for at least 7 days.  Instructed her to follow-up with her PCP.  Patient agrees to plan of care.   Final Clinical Impressions(s) / UC Diagnoses   Final diagnoses:  Malodorous urine  Vaginal discharge     Discharge Instructions      The urine culture is pending.  We will call you if it shows the need for treatment.     Take the Diflucan as directed.    Your vaginal tests are pending.  If your test results are positive, we will call you.  You and your sexual partner(s) may require treatment at that time.  Do not have sexual activity for at least 7 days.    Follow up with your primary care provider.         ED Prescriptions     Medication Sig Dispense Auth. Provider   fluconazole (DIFLUCAN) 150 MG tablet Take 1 tablet (150 mg total) by mouth daily. Take one tablet today.  May repeat in 3 days. 2 tablet Sharion Balloon, NP      I have reviewed the PDMP during this encounter.   Sharion Balloon, NP 04/14/22 1600

## 2022-04-14 NOTE — Discharge Instructions (Addendum)
The urine culture is pending.  We will call you if it shows the need for treatment.     Take the Diflucan as directed.    Your vaginal tests are pending.  If your test results are positive, we will call you.  You and your sexual partner(s) may require treatment at that time.  Do not have sexual activity for at least 7 days.    Follow up with your primary care provider.

## 2022-04-15 ENCOUNTER — Telehealth (HOSPITAL_COMMUNITY): Payer: Self-pay | Admitting: Emergency Medicine

## 2022-04-15 LAB — CERVICOVAGINAL ANCILLARY ONLY
Bacterial Vaginitis (gardnerella): POSITIVE — AB
Candida Glabrata: NEGATIVE
Candida Vaginitis: NEGATIVE
Chlamydia: NEGATIVE
Comment: NEGATIVE
Comment: NEGATIVE
Comment: NEGATIVE
Comment: NEGATIVE
Comment: NEGATIVE
Comment: NORMAL
Neisseria Gonorrhea: NEGATIVE
Trichomonas: NEGATIVE

## 2022-04-15 MED ORDER — METRONIDAZOLE 500 MG PO TABS: 500.0000 mg | ORAL_TABLET | Freq: Two times a day (BID) | ORAL | 0 refills | Status: DC

## 2022-04-17 LAB — URINE CULTURE: Culture: 70000 — AB

## 2022-04-18 ENCOUNTER — Telehealth (HOSPITAL_COMMUNITY): Payer: Self-pay | Admitting: Emergency Medicine

## 2022-04-18 MED ORDER — NITROFURANTOIN MONOHYD MACRO 100 MG PO CAPS: 100.0000 mg | ORAL_CAPSULE | Freq: Two times a day (BID) | ORAL | 0 refills | Status: DC

## 2022-05-18 ENCOUNTER — Ambulatory Visit: Payer: BC Managed Care – PPO | Admitting: Family Medicine

## 2022-05-18 ENCOUNTER — Encounter: Payer: Self-pay | Admitting: Family Medicine

## 2022-05-18 VITALS — BP 146/92 | HR 69 | Temp 98.6°F | Resp 16 | Wt 134.6 lb

## 2022-05-18 DIAGNOSIS — F17200 Nicotine dependence, unspecified, uncomplicated: Secondary | ICD-10-CM | POA: Diagnosis not present

## 2022-05-18 DIAGNOSIS — Z136 Encounter for screening for cardiovascular disorders: Secondary | ICD-10-CM | POA: Diagnosis not present

## 2022-05-18 DIAGNOSIS — Z130 Encounter for screening for diseases of the blood and blood-forming organs and certain disorders involving the immune mechanism: Secondary | ICD-10-CM | POA: Diagnosis not present

## 2022-05-18 DIAGNOSIS — Z1231 Encounter for screening mammogram for malignant neoplasm of breast: Secondary | ICD-10-CM

## 2022-05-18 DIAGNOSIS — R634 Abnormal weight loss: Secondary | ICD-10-CM

## 2022-05-18 DIAGNOSIS — Z1211 Encounter for screening for malignant neoplasm of colon: Secondary | ICD-10-CM

## 2022-05-18 DIAGNOSIS — Z13228 Encounter for screening for other metabolic disorders: Secondary | ICD-10-CM | POA: Diagnosis not present

## 2022-05-18 DIAGNOSIS — Z1329 Encounter for screening for other suspected endocrine disorder: Secondary | ICD-10-CM | POA: Diagnosis not present

## 2022-05-18 NOTE — Assessment & Plan Note (Signed)
Current daily smoker with 40 pack year history. Not yet eligible for lung cancer screening. Discussed options to aid in cessation, interested in chantix. Per chart review, was previously on wellbutrin. Will reassess options at follow up following weight loss work up (see separate problem for plan).

## 2022-05-18 NOTE — Assessment & Plan Note (Addendum)
~  40lb unintentional loss in 4 months. Only symptoms with recent polyuria, polydipsia and one episode of blurry vision after drinking Mt Dew last week. Has h/o hyperglycemia and with recent symptoms, some concern for new onset diabetes, obtain A1c. Will also order age-appropriate cancer screenings, colonoscopy, mammogram. Does have extensive smoking history, counseling provided. Not yet eligible for lung cancer screening due to age. Will also obtain labs to assess for other possible contributors. F/u 1 month, at that time obtain PHQ.

## 2022-05-18 NOTE — Progress Notes (Signed)
    SUBJECTIVE:   CHIEF COMPLAINT / HPI:   WEIGHT LOSS Duration: ~4 months Amount of weight loss: ~40 lbs Fevers: no Decreased appetite: no Night sweats: yes, about a year Dysphagia/odynophagia: no Chest pain: no Shortness of breath: no Cough:  smoker's cough. Productive of mucus occasional Nausea: no Vomiting: no Abdominal pain: no Blood in stool: no Easy bruising/bleeding: no Jaundice: no Polydipsia/polyuria: yes Depression: no Previous colonoscopy: no - did have one episode of eyes not focusing after drinking Mt Dew last week - taking care of mom and nephew more recently - father passed recently from lung cancer - current 1ppd smoker since 51yo. - denies new meds  OBJECTIVE:   BP (!) 146/92 (BP Location: Left Arm, Patient Position: Sitting, Cuff Size: Normal)   Pulse 69   Temp 98.6 F (37 C) (Oral)   Resp 16   Wt 134 lb 9.6 oz (61.1 kg)   BMI 24.62 kg/m   Gen: well appearing, in NAD Card: RRR Lungs: CTAB Abd: soft, NTND, +BS Ext: WWP, no edema   ASSESSMENT/PLAN:   Unintentional weight loss ~40lb unintentional loss in 4 months. Only symptoms with recent polyuria, polydipsia and one episode of blurry vision after drinking Mt Dew last week. Has h/o hyperglycemia and with recent symptoms, some concern for new onset diabetes, obtain A1c. Will also order age-appropriate cancer screenings, colonoscopy, mammogram. Does have extensive smoking history, counseling provided. Not yet eligible for lung cancer screening due to age. Will also obtain labs to assess for other possible contributors. F/u 1 month, at that time obtain PHQ.  Tobacco use disorder Current daily smoker with 40 pack year history. Not yet eligible for lung cancer screening. Discussed options to aid in cessation, interested in chantix. Per chart review, was previously on wellbutrin. Will reassess options at follow up following weight loss work up (see separate problem for plan).     Myles Gip,  DO

## 2022-05-19 LAB — HEMOGLOBIN A1C
Est. average glucose Bld gHb Est-mCnc: 120 mg/dL
Hgb A1c MFr Bld: 5.8 % — ABNORMAL HIGH (ref 4.8–5.6)

## 2022-05-19 LAB — COMPREHENSIVE METABOLIC PANEL
ALT: 14 IU/L (ref 0–32)
AST: 19 IU/L (ref 0–40)
Albumin/Globulin Ratio: 2.1 (ref 1.2–2.2)
Albumin: 4.2 g/dL (ref 3.9–4.9)
Alkaline Phosphatase: 97 IU/L (ref 44–121)
BUN/Creatinine Ratio: 16 (ref 9–23)
BUN: 14 mg/dL (ref 6–24)
Bilirubin Total: <0.2 mg/dL (ref 0.0–1.2)
CO2: 24 mmol/L (ref 20–29)
Calcium: 9.6 mg/dL (ref 8.7–10.2)
Chloride: 104 mmol/L (ref 96–106)
Creatinine, Ser: 0.89 mg/dL (ref 0.57–1.00)
Globulin, Total: 2 g/dL (ref 1.5–4.5)
Glucose: 99 mg/dL (ref 70–99)
Potassium: 4.9 mmol/L (ref 3.5–5.2)
Sodium: 141 mmol/L (ref 134–144)
Total Protein: 6.2 g/dL (ref 6.0–8.5)
eGFR: 79 mL/min/{1.73_m2} (ref >59)

## 2022-05-19 LAB — CBC WITH DIFFERENTIAL/PLATELET
Basophils Absolute: 0.1 10*3/uL (ref 0.0–0.2)
Basos: 1 %
EOS (ABSOLUTE): 0.5 10*3/uL — ABNORMAL HIGH (ref 0.0–0.4)
Eos: 6 %
Hematocrit: 42.7 % (ref 34.0–46.6)
Hemoglobin: 14.9 g/dL (ref 11.1–15.9)
Immature Grans (Abs): 0 10*3/uL (ref 0.0–0.1)
Immature Granulocytes: 0 %
Lymphocytes Absolute: 2 10*3/uL (ref 0.7–3.1)
Lymphs: 28 %
MCH: 33.9 pg — ABNORMAL HIGH (ref 26.6–33.0)
MCHC: 34.9 g/dL (ref 31.5–35.7)
MCV: 97 fL (ref 79–97)
Monocytes Absolute: 0.6 10*3/uL (ref 0.1–0.9)
Monocytes: 8 %
Neutrophils Absolute: 4.1 10*3/uL (ref 1.4–7.0)
Neutrophils: 57 %
Platelets: 205 10*3/uL (ref 150–450)
RBC: 4.4 x10E6/uL (ref 3.77–5.28)
RDW: 12.6 % (ref 11.7–15.4)
WBC: 7.3 10*3/uL (ref 3.4–10.8)

## 2022-05-19 LAB — TSH: TSH: 1.7 u[IU]/mL (ref 0.450–4.500)

## 2022-05-19 LAB — SEDIMENTATION RATE: Sed Rate: 2 mm/hr (ref 0–32)

## 2022-05-19 LAB — LACTATE DEHYDROGENASE: LDH: 192 IU/L (ref 119–226)

## 2022-05-19 LAB — HIV ANTIBODY (ROUTINE TESTING W REFLEX): HIV Screen 4th Generation wRfx: NONREACTIVE

## 2022-05-26 ENCOUNTER — Telehealth: Payer: Self-pay

## 2022-05-26 ENCOUNTER — Other Ambulatory Visit: Payer: Self-pay

## 2022-05-26 DIAGNOSIS — Z1211 Encounter for screening for malignant neoplasm of colon: Secondary | ICD-10-CM

## 2022-05-26 DIAGNOSIS — Z8371 Family history of colonic polyps: Secondary | ICD-10-CM

## 2022-05-26 MED ORDER — GOLYTELY 236 G PO SOLR: 4000.0000 mL | Freq: Once | ORAL | 0 refills | Status: AC

## 2022-05-26 NOTE — Telephone Encounter (Signed)
Gastroenterology Pre-Procedure Review  Request Date: 06/21/22 Requesting Physician: Dr. Marius Ditch  PATIENT REVIEW QUESTIONS: The patient responded to the following health history questions as indicated:    1. Are you having any GI issues? no GI issues.  Pt has history of small bowel obstruction 05/23/20 2. Do you have a personal history of Polyps? no 3. Do you have a family history of Colon Cancer or Polyps? yes (mother and grandmother colon polyps) 4. Diabetes Mellitus? no 5. Joint replacements in the past 12 months?no 6. Major health problems in the past 3 months?no 7. Any artificial heart valves, MVP, or defibrillator?no    MEDICATIONS & ALLERGIES:    Patient reports the following regarding taking any anticoagulation/antiplatelet therapy:   Plavix, Coumadin, Eliquis, Xarelto, Lovenox, Pradaxa, Brilinta, or Effient? no Aspirin? no  Patient confirms/reports the following medications:  No current outpatient medications on file.   No current facility-administered medications for this visit.    Patient confirms/reports the following allergies:  No Known Allergies  No orders of the defined types were placed in this encounter.   AUTHORIZATION INFORMATION Primary Insurance: 1D#: Group #:  Secondary Insurance: 1D#: Group #:  SCHEDULE INFORMATION: Date: 06/21/22 Time: Location: [place]

## 2022-06-15 ENCOUNTER — Other Ambulatory Visit: Payer: Self-pay

## 2022-06-15 ENCOUNTER — Encounter: Payer: Self-pay | Admitting: Gastroenterology

## 2022-06-21 ENCOUNTER — Ambulatory Visit
Admission: RE | Admit: 2022-06-21 | Discharge: 2022-06-21 | Disposition: A | Discharge status: Home / Self Care | Payer: BC Managed Care – PPO | Source: Ambulatory Visit | Attending: Gastroenterology | Admitting: Gastroenterology

## 2022-06-21 ENCOUNTER — Ambulatory Visit: Payer: BC Managed Care – PPO | Admitting: Anesthesiology

## 2022-06-21 ENCOUNTER — Other Ambulatory Visit: Payer: Self-pay

## 2022-06-21 ENCOUNTER — Encounter
Admission: RE | Disposition: A | Discharge status: Home / Self Care | Payer: Self-pay | Source: Ambulatory Visit | Attending: Gastroenterology

## 2022-06-21 DIAGNOSIS — F17210 Nicotine dependence, cigarettes, uncomplicated: Secondary | ICD-10-CM | POA: Insufficient documentation

## 2022-06-21 DIAGNOSIS — Z83719 Family history of colon polyps, unspecified: Secondary | ICD-10-CM

## 2022-06-21 DIAGNOSIS — D125 Benign neoplasm of sigmoid colon: Secondary | ICD-10-CM | POA: Insufficient documentation

## 2022-06-21 DIAGNOSIS — F419 Anxiety disorder, unspecified: Secondary | ICD-10-CM | POA: Diagnosis not present

## 2022-06-21 DIAGNOSIS — K5730 Diverticulosis of large intestine without perforation or abscess without bleeding: Secondary | ICD-10-CM | POA: Diagnosis not present

## 2022-06-21 DIAGNOSIS — K635 Polyp of colon: Secondary | ICD-10-CM

## 2022-06-21 DIAGNOSIS — Z9049 Acquired absence of other specified parts of digestive tract: Secondary | ICD-10-CM | POA: Insufficient documentation

## 2022-06-21 DIAGNOSIS — Z1211 Encounter for screening for malignant neoplasm of colon: Secondary | ICD-10-CM

## 2022-06-21 HISTORY — PX: COLONOSCOPY WITH PROPOFOL: SHX5780

## 2022-06-21 HISTORY — PX: POLYPECTOMY: SHX5525

## 2022-06-21 LAB — POCT PREGNANCY, URINE: Preg Test, Ur: NEGATIVE

## 2022-06-21 SURGERY — COLONOSCOPY WITH PROPOFOL
Anesthesia: Monitor Anesthesia Care | Site: Rectum

## 2022-06-21 MED ORDER — SODIUM CHLORIDE 0.9 % IV SOLN: INTRAVENOUS | Status: DC

## 2022-06-21 MED ORDER — PROPOFOL 10 MG/ML IV BOLUS
INTRAVENOUS | Status: DC | PRN
  Administered 2022-06-21 (×2): 50 mg via INTRAVENOUS
  Administered 2022-06-21: 100 mg via INTRAVENOUS
  Administered 2022-06-21: 50 mg via INTRAVENOUS

## 2022-06-21 MED ORDER — STERILE WATER FOR IRRIGATION IR SOLN
Status: DC | PRN
  Administered 2022-06-21: 320 mL

## 2022-06-21 MED ORDER — STERILE WATER FOR IRRIGATION IR SOLN
Status: DC | PRN
  Administered 2022-06-21: 1

## 2022-06-21 MED ORDER — LACTATED RINGERS IV SOLN: INTRAVENOUS | Status: DC

## 2022-06-21 SURGICAL SUPPLY — 25 items

## 2022-06-21 NOTE — Anesthesia Postprocedure Evaluation (Signed)
Anesthesia Post Note  Patient: Brittany Herrera  Procedure(s) Performed: COLONOSCOPY WITH PROPOFOL (Rectum) POLYPECTOMY (Rectum)  Patient location during evaluation: PACU Anesthesia Type: MAC Level of consciousness: awake and alert Pain management: pain level controlled Vital Signs Assessment: post-procedure vital signs reviewed and stable Respiratory status: spontaneous breathing, nonlabored ventilation, respiratory function stable and patient connected to nasal cannula oxygen Cardiovascular status: blood pressure returned to baseline and stable Postop Assessment: no apparent nausea or vomiting Anesthetic complications: no   No notable events documented.   Last Vitals:  Vitals:   06/21/22 0830 06/21/22 0834  BP: 129/79 (!) 132/99  Pulse: 63 (!) 59  Resp: 17 (!) 24  Temp:  (!) 36.2 C  SpO2: 100% 100%    Last Pain:  Vitals:   06/21/22 0834  TempSrc:   PainSc: 0-No pain                 Dimas Millin

## 2022-06-21 NOTE — Op Note (Signed)
[facility]astroenterology Patient Name: Brittany Herrera Procedure Date: 06/21/2022 7:15 AM MRN: [id number] Account #: [id number] Date of Birth: 29-Mar-1971 Admit Type: Outpatient Age: 51 Room: [facility] OR ROOM 01 Gender: Female Note Status: Finalized Instrument Name: [id number] Procedure:             Colonoscopy Indications:           Screening for colorectal malignant neoplasm, This is                         the patient's first colonoscopy Providers:             Lin Landsman MD, MD Referring MD:          Dionne Bucy. Bacigalupo (Referring MD) Medicines:             General Anesthesia Complications:         No immediate complications. Estimated blood loss: None. Procedure:             Pre-Anesthesia Assessment:                        - Prior to the procedure, a History and Physical was                         performed, and patient medications and allergies were                         reviewed. The patient is competent. The risks and                         benefits of the procedure and the sedation options and                         risks were discussed with the patient. All questions                         were answered and informed consent was obtained.                         Patient identification and proposed procedure were                         verified by the physician, the nurse, the                         anesthesiologist, the anesthetist and the technician                         in the pre-procedure area in the procedure room in the                         endoscopy suite. Mental Status Examination: alert and                         oriented. Airway Examination: normal oropharyngeal                         airway and neck mobility. Respiratory Examination:  clear to auscultation. CV Examination: normal.                         Prophylactic Antibiotics: The patient does not require                         prophylactic  antibiotics. Prior Anticoagulants: The                         patient has taken no previous anticoagulant or                         antiplatelet agents. ASA Grade Assessment: II - A                         patient with mild systemic disease. After reviewing                         the risks and benefits, the patient was deemed in                         satisfactory condition to undergo the procedure. The                         anesthesia plan was to use general anesthesia.                         Immediately prior to administration of medications,                         the patient was re-assessed for adequacy to receive                         sedatives. The heart rate, respiratory rate, oxygen                         saturations, blood pressure, adequacy of pulmonary                         ventilation, and response to care were monitored                         throughout the procedure. The physical status of the                         patient was re-assessed after the procedure.                        After obtaining informed consent, the colonoscope was                         passed under direct vision. Throughout the procedure,                         the patient's blood pressure, pulse, and oxygen                         saturations were monitored continuously. The  Colonoscope was introduced through the anus and                         advanced to the the cecum, identified by appendiceal                         orifice and ileocecal valve. The colonoscopy was                         performed without difficulty. The patient tolerated                         the procedure well. The quality of the bowel                         preparation was evaluated using the BBPS [facility] Bowel                         Preparation Scale) with scores of: Right Colon = 3,                         Transverse Colon = 3 and Left Colon = 3 (entire mucosa                          seen well with no residual staining, small fragments                         of stool or opaque liquid). The total BBPS score                         equals 9. Findings:      The perianal and digital rectal examinations were normal. Pertinent       negatives include normal sphincter tone and no palpable rectal lesions.      A 5 mm polyp was found in the sigmoid colon. The polyp was sessile. The       polyp was removed with a cold snare. Resection and retrieval were       complete.      The retroflexed view of the distal rectum and anal verge was normal and       showed no anal or rectal abnormalities.      Multiple diverticula were found in the sigmoid colon. Impression:            - One 5 mm polyp in the sigmoid colon, removed with a                         cold snare. Resected and retrieved.                        - The distal rectum and anal verge are normal on                         retroflexion view.                        - Diverticulosis in the sigmoid colon. Recommendation:        - Discharge patient to home (with escort).                        -  Resume previous diet today.                        - Continue present medications.                        - Await pathology results.                        - Repeat colonoscopy in 5 years for surveillance. Procedure Code(s):     --- Professional ---                        [id number], Colonoscopy, flexible; with removal of                         tumor(s), polyp(s), or other lesion(s) by snare                         technique Diagnosis Code(s):     --- Professional ---                        Z12.11, Encounter for screening for malignant neoplasm                         of colon                        K63.5, Polyp of colon                        K57.30, Diverticulosis of large intestine without                         perforation or abscess without bleeding CPT copyright 2019 American Medical Association. All rights reserved. The codes  documented in this report are preliminary and upon coder review may  be revised to meet current compliance requirements. Dr. Ulyess Mort Lin Landsman MD, MD 06/21/2022 8:21:33 AM This report has been signed electronically. Number of Addenda: 0 Note Initiated On: 06/21/2022 7:15 AM Scope Withdrawal Time: 0 hours 28 minutes 31 seconds  Total Procedure Duration: 0 hours 31 minutes 31 seconds  Estimated Blood Loss:  Estimated blood loss: none.      [facility]

## 2022-06-21 NOTE — Transfer of Care (Signed)
Immediate Anesthesia Transfer of Care Note  Patient: Brittany Herrera  Procedure(s) Performed: COLONOSCOPY WITH PROPOFOL (Rectum) POLYPECTOMY (Rectum)  Patient Location: PACU  Anesthesia Type: MAC  Level of Consciousness: awake, alert  and patient cooperative  Airway and Oxygen Therapy: Patient Spontanous Breathing and Patient connected to supplemental oxygen  Post-op Assessment: Post-op Vital signs reviewed, Patient's Cardiovascular Status Stable, Respiratory Function Stable, Patent Airway and No signs of Nausea or vomiting  Post-op Vital Signs: Reviewed and stable  Complications: No notable events documented.

## 2022-06-21 NOTE — H&P (Signed)
[name], MD [address]  [place]  [place], [place] [address]  Main: [phone]  Fax: [phone] Pager: [phone]  Primary Care Physician:  [name], MD Primary Gastroenterologist:  Dr. [name]  Pre-Procedure History & Physical: HPI:  Brittany Herrera is a 51 y.o. female is here for an colonoscopy.   Past Medical History:  Diagnosis Date   Abnormal Pap smear 2001   [place] [place] [facility]- told her that she had polyps didnt get tx   Anemia    ONLY WITH PREGNANCY   DUB (dysfunctional uterine bleeding)    menorrhagia, and bleeding continuously x3 yrs   Family history of adverse reaction to anesthesia    [place] [facility]   Kidney stones 2011, 2012   Staghorn kidney stones     Past Surgical History:  Procedure Laterality Date   CYSTOSCOPY W/ URETERAL STENT PLACEMENT Left 04/22/2015   Procedure: CYSTOSCOPY WITH STENT REPLACEMENT;  Surgeon: Collier Flowers, MD;  Location: [facility];  Service: Urology;  Laterality: Left;   CYSTOSCOPY/URETEROSCOPY/HOLMIUM LASER/STENT PLACEMENT Left 04/08/2015   Procedure: CYSTOSCOPY/URETEROSCOPY/HOLMIUM LASER/STENT PLACEMENT;  Surgeon: [name], MD;  Location: [facility];  Service: Urology;  Laterality: Left;   CYSTOSCOPY/URETEROSCOPY/HOLMIUM LASER/STENT PLACEMENT Left 08/25/2020   Procedure: CYSTOSCOPY/URETEROSCOPY/HOLMIUM LASER/STENT PLACEMENT;  Surgeon: Abbie Sons, MD;  Location: [facility];  Service: Urology;  Laterality: Left;   LITHOTRIPSY     URETEROSCOPY WITH HOLMIUM LASER LITHOTRIPSY Left 04/22/2015   Procedure: URETEROSCOPY WITH HOLMIUM LASER LITHOTRIPSY;  Surgeon: Collier Flowers, MD;  Location: [facility];  Service: Urology;  Laterality: Left;   XI ROBOTIC ASSISTED SMALL BOWEL RESECTION N/A 05/23/2020   Procedure: XI ROBOTIC ASSISTED SMALL BOWEL OBSTRUCTION; LYSIS OF ADHESIONS. INCIDENTAL APPENDECTOMY;  Surgeon: Jules Husbands, MD;  Location: [facility];  Service: General;  Laterality: N/A;     Prior to Admission medications   Not on File    Allergies as of 05/26/2022   (No Known Allergies)    Family History  Problem Relation Age of Onset   Cancer Mother 87       breast   Asthma Mother    Arthritis Mother    Anemia Sister    Urinary tract infection Sister    Anemia Sister    Urinary tract infection Sister    Cancer Maternal Grandmother 19       breast, colon (pre-cancerous polyps)   Anemia Maternal Grandmother    Heart disease Maternal Grandfather    Diabetes Paternal Grandmother    Stroke Paternal Grandmother    COPD Paternal Grandfather    Anemia Daughter    Prostate cancer Maternal Uncle     Social History   Socioeconomic History   Marital status: Legally Separated    Spouse name: Not on file   Number of children: 1   Years of education: Not on file   Highest education level: Not on file  Occupational History    Employer: [employer]  Tobacco Use   Smoking status: Some Days    Packs/day: 1.00    Years: 23.00    Total pack years: 23.00    Types: Cigarettes   Smokeless tobacco: Never   Tobacco comments:    smokes 3 cigarettes per week  Vaping Use   Vaping Use: Former  Substance and Sexual Activity   Alcohol use: Not Currently    Alcohol/week: 4.0 standard drinks of alcohol    Types: 4 Shots of liquor per week  Drug use: Yes    Types: Marijuana    Comment: patient no longer smoking   Sexual activity: Yes    Partners: Male    Birth control/protection: Implant  Other Topics Concern   Not on file  Social History Narrative   Not on file   Social Determinants of Health   Financial Resource Strain: Not on file  Food Insecurity: Not on file  Transportation Needs: Not on file  Physical Activity: Not on file  Stress: Not on file  Social Connections: Not on file  Intimate Partner Violence: Not on file    Review of Systems: See HPI, otherwise negative ROS  Physical Exam: BP 134/84   Pulse 70   Temp 98.2 F (36.8 C)  (Temporal)   Resp 16   Ht '5\' 3"'$  (1.6 m)   Wt 60.8 kg   SpO2 100%   BMI 23.74 kg/m  General:   Alert,  pleasant and cooperative in NAD Head:  Normocephalic and atraumatic. Neck:  Supple; no masses or thyromegaly. Lungs:  Clear throughout to auscultation.    Heart:  Regular rate and rhythm. Abdomen:  Soft, nontender and nondistended. Normal bowel sounds, without guarding, and without rebound.   Neurologic:  Alert and  oriented x4;  grossly normal neurologically.  Impression/Plan: Brittany Herrera is here for an colonoscopy to be performed for colon cancer screening  Risks, benefits, limitations, and alternatives regarding  colonoscopy have been reviewed with the patient.  Questions have been answered.  All parties agreeable.   Sherri Sear, MD  06/21/2022, 7:35 AM

## 2022-06-21 NOTE — Anesthesia Preprocedure Evaluation (Signed)
Anesthesia Evaluation  Patient identified by MRN, date of birth, ID band Patient awake    Reviewed: Allergy & Precautions, NPO status , Patient's Chart, lab work & pertinent test results  Airway Mallampati: III  TM Distance: >3 FB Neck ROM: full    Dental  (+) Chipped, Teeth Intact   Pulmonary neg pulmonary ROS, neg shortness of breath, neg COPD, Current SmokerPatient did not abstain from smoking.,    Pulmonary exam normal        Cardiovascular (-) angina(-) Past MI and (-) CABG negative cardio ROS Normal cardiovascular exam     Neuro/Psych negative neurological ROS  negative psych ROS   GI/Hepatic negative GI ROS, Neg liver ROS,   Endo/Other  negative endocrine ROS  Renal/GU negative Renal ROS  negative genitourinary   Musculoskeletal   Abdominal   Peds  Hematology negative hematology ROS (+)   Anesthesia Other Findings Past Medical History: 2001: Abnormal Pap smear     Comment:  [place] [facility]- told her that she had polyps didnt               get tx No date: Anemia     Comment:  ONLY WITH PREGNANCY No date: DUB (dysfunctional uterine bleeding)     Comment:  menorrhagia, and bleeding continuously x3 yrs No date: Family history of adverse reaction to anesthesia     Comment:  MOM-HAS HARD TIME WAKING UP 2011, 2012: Kidney stones No date: Staghorn kidney stones  Past Surgical History: 04/22/2015: CYSTOSCOPY W/ URETERAL STENT PLACEMENT; Left     Comment:  Procedure: CYSTOSCOPY WITH STENT REPLACEMENT;  Surgeon:               Collier Flowers, MD;  Location: [facility];  Service:               Urology;  Laterality: Left; 04/08/2015: CYSTOSCOPY/URETEROSCOPY/HOLMIUM LASER/STENT PLACEMENT; Left     Comment:  Procedure: CYSTOSCOPY/URETEROSCOPY/HOLMIUM LASER/STENT               PLACEMENT;  Surgeon: [name], MD;  Location: [facility]               [facility];  Service: Urology;  Laterality: Left; 08/25/2020:  CYSTOSCOPY/URETEROSCOPY/HOLMIUM LASER/STENT PLACEMENT;  Left     Comment:  Procedure: CYSTOSCOPY/URETEROSCOPY/HOLMIUM LASER/STENT               PLACEMENT;  Surgeon: Abbie Sons, MD;  Location:               [facility];  Service: Urology;  Laterality: Left; No date: LITHOTRIPSY 04/22/2015: URETEROSCOPY WITH HOLMIUM LASER LITHOTRIPSY; Left     Comment:  Procedure: URETEROSCOPY WITH HOLMIUM LASER LITHOTRIPSY;               Surgeon: Collier Flowers, MD;  Location: [facility];                Service: Urology;  Laterality: Left; 05/23/2020: XI ROBOTIC ASSISTED SMALL BOWEL RESECTION; N/A     Comment:  Procedure: XI ROBOTIC ASSISTED SMALL BOWEL OBSTRUCTION;               LYSIS OF ADHESIONS. INCIDENTAL APPENDECTOMY;  Surgeon:               Jules Husbands, MD;  Location: [facility];  Service:               General;  Laterality: N/A;  BMI    Body Mass Index: 23.74 kg/m  Reproductive/Obstetrics negative OB ROS                             Anesthesia Physical Anesthesia Plan  ASA: 2  Anesthesia Plan: General   Post-op Pain Management: Minimal or no pain anticipated   Induction: Intravenous  PONV Risk Score and Plan: 3 and Propofol infusion, TIVA and Ondansetron  Airway Management Planned: Nasal Cannula  Additional Equipment: None  Intra-op Plan:   Post-operative Plan:   Informed Consent: I have reviewed the patients History and Physical, chart, labs and discussed the procedure including the risks, benefits and alternatives for the proposed anesthesia with the patient or authorized representative who has indicated his/her understanding and acceptance.     Dental advisory given  Plan Discussed with: CRNA and Surgeon  Anesthesia Plan Comments: (Discussed risks of anesthesia with patient, including possibility of difficulty with spontaneous ventilation under anesthesia necessitating airway intervention, PONV, and rare risks such as cardiac or respiratory or  neurological events, and allergic reactions. Discussed the role of CRNA in patient's perioperative care. Patient understands.)        Anesthesia Quick Evaluation

## 2022-06-22 ENCOUNTER — Encounter: Payer: Self-pay | Admitting: Gastroenterology

## 2022-06-23 LAB — SURGICAL PATHOLOGY

## 2022-06-29 ENCOUNTER — Encounter: Payer: Self-pay | Admitting: Gastroenterology

## 2022-11-11 ENCOUNTER — Ambulatory Visit
Admission: RE | Admit: 2022-11-11 | Discharge: 2022-11-11 | Disposition: A | Discharge status: Home / Self Care | Payer: MEDICAID | Source: Ambulatory Visit | Attending: Family Medicine | Admitting: Family Medicine

## 2022-11-11 DIAGNOSIS — Z1231 Encounter for screening mammogram for malignant neoplasm of breast: Secondary | ICD-10-CM | POA: Diagnosis not present

## 2022-11-14 ENCOUNTER — Other Ambulatory Visit: Payer: Self-pay | Admitting: Family Medicine

## 2022-11-14 ENCOUNTER — Encounter: Payer: Self-pay | Admitting: Family Medicine

## 2022-11-14 DIAGNOSIS — N63 Unspecified lump in unspecified breast: Secondary | ICD-10-CM

## 2022-11-14 DIAGNOSIS — R928 Other abnormal and inconclusive findings on diagnostic imaging of breast: Secondary | ICD-10-CM

## 2022-11-17 ENCOUNTER — Other Ambulatory Visit: Payer: Self-pay | Admitting: Family Medicine

## 2022-11-17 ENCOUNTER — Telehealth: Payer: Self-pay

## 2022-11-17 DIAGNOSIS — N63 Unspecified lump in unspecified breast: Secondary | ICD-10-CM

## 2022-11-17 DIAGNOSIS — R928 Other abnormal and inconclusive findings on diagnostic imaging of breast: Secondary | ICD-10-CM

## 2022-11-17 NOTE — Telephone Encounter (Signed)
Copied from Roy [id number]. Topic: Appointment Scheduling - Scheduling Inquiry for Clinic >> Nov 17, 2022  9:33 AM Erskine Squibb wrote: Reason for CRM: Anderson Malta with [facility] called in stating she sent some orders for a mammogram for a patient of Shela Commons but she still hasn't received a signature. She is also going to fax over a copy to be signed. Please sent back signed copy as soon as possible to the fax number listed on the paperwork.

## 2022-11-21 ENCOUNTER — Other Ambulatory Visit: Payer: Self-pay | Admitting: Family Medicine

## 2022-11-21 DIAGNOSIS — R928 Other abnormal and inconclusive findings on diagnostic imaging of breast: Secondary | ICD-10-CM

## 2022-11-21 DIAGNOSIS — N63 Unspecified lump in unspecified breast: Secondary | ICD-10-CM

## 2022-11-21 NOTE — Telephone Encounter (Signed)
Have you seen this orders?

## 2022-11-22 ENCOUNTER — Other Ambulatory Visit: Payer: Self-pay | Admitting: Family Medicine

## 2022-11-22 DIAGNOSIS — N63 Unspecified lump in unspecified breast: Secondary | ICD-10-CM

## 2022-11-22 DIAGNOSIS — R928 Other abnormal and inconclusive findings on diagnostic imaging of breast: Secondary | ICD-10-CM

## 2022-11-25 ENCOUNTER — Inpatient Hospital Stay: Admission: RE | Admit: 2022-11-25 | Payer: BC Managed Care – PPO | Source: Ambulatory Visit

## 2022-11-25 ENCOUNTER — Other Ambulatory Visit: Payer: BC Managed Care – PPO

## 2022-12-05 ENCOUNTER — Other Ambulatory Visit: Payer: BC Managed Care – PPO

## 2022-12-05 ENCOUNTER — Inpatient Hospital Stay: Admission: RE | Admit: 2022-12-05 | Payer: BC Managed Care – PPO | Source: Ambulatory Visit

## 2023-01-06 ENCOUNTER — Ambulatory Visit
Admission: RE | Admit: 2023-01-06 | Discharge: 2023-01-06 | Disposition: A | Discharge status: Home / Self Care | Payer: BC Managed Care – PPO | Source: Ambulatory Visit | Attending: Family Medicine | Admitting: Family Medicine

## 2023-01-06 DIAGNOSIS — R92332 Mammographic heterogeneous density, left breast: Secondary | ICD-10-CM | POA: Diagnosis not present

## 2023-01-06 DIAGNOSIS — N63 Unspecified lump in unspecified breast: Secondary | ICD-10-CM

## 2023-01-06 DIAGNOSIS — R928 Other abnormal and inconclusive findings on diagnostic imaging of breast: Secondary | ICD-10-CM | POA: Insufficient documentation

## 2023-01-06 DIAGNOSIS — N6323 Unspecified lump in the left breast, lower outer quadrant: Secondary | ICD-10-CM | POA: Diagnosis not present

## 2023-01-06 DIAGNOSIS — N6321 Unspecified lump in the left breast, upper outer quadrant: Secondary | ICD-10-CM | POA: Diagnosis not present

## 2023-01-09 ENCOUNTER — Other Ambulatory Visit: Payer: Self-pay | Admitting: Family Medicine

## 2023-01-09 DIAGNOSIS — R928 Other abnormal and inconclusive findings on diagnostic imaging of breast: Secondary | ICD-10-CM

## 2023-01-09 DIAGNOSIS — N63 Unspecified lump in unspecified breast: Secondary | ICD-10-CM

## 2023-01-13 ENCOUNTER — Emergency Department
Admission: EM | Admit: 2023-01-13 | Discharge: 2023-01-13 | Disposition: A | Discharge status: Home / Self Care | Payer: BC Managed Care – PPO | Attending: Emergency Medicine | Admitting: Emergency Medicine

## 2023-01-13 ENCOUNTER — Encounter: Payer: Self-pay | Admitting: Emergency Medicine

## 2023-01-13 ENCOUNTER — Emergency Department: Payer: BC Managed Care – PPO

## 2023-01-13 ENCOUNTER — Other Ambulatory Visit: Payer: Self-pay

## 2023-01-13 DIAGNOSIS — N200 Calculus of kidney: Secondary | ICD-10-CM | POA: Diagnosis not present

## 2023-01-13 DIAGNOSIS — R9431 Abnormal electrocardiogram [ECG] [EKG]: Secondary | ICD-10-CM | POA: Diagnosis not present

## 2023-01-13 DIAGNOSIS — N12 Tubulo-interstitial nephritis, not specified as acute or chronic: Secondary | ICD-10-CM | POA: Diagnosis not present

## 2023-01-13 DIAGNOSIS — R109 Unspecified abdominal pain: Secondary | ICD-10-CM | POA: Diagnosis not present

## 2023-01-13 LAB — CBC
HCT: 44.9 % (ref 36.0–46.0)
Hemoglobin: 15.4 g/dL — ABNORMAL HIGH (ref 12.0–15.0)
MCH: 33.3 pg (ref 26.0–34.0)
MCHC: 34.3 g/dL (ref 30.0–36.0)
MCV: 97.2 fL (ref 80.0–100.0)
Platelets: 180 10*3/uL (ref 150–400)
RBC: 4.62 MIL/uL (ref 3.87–5.11)
RDW: 12.6 % (ref 11.5–15.5)
WBC: 4.7 10*3/uL (ref 4.0–10.5)
nRBC: 0 % (ref 0.0–0.2)

## 2023-01-13 LAB — URINALYSIS, ROUTINE W REFLEX MICROSCOPIC
Bilirubin Urine: NEGATIVE
Glucose, UA: NEGATIVE mg/dL
Hgb urine dipstick: NEGATIVE
Ketones, ur: NEGATIVE mg/dL
Nitrite: NEGATIVE
Protein, ur: NEGATIVE mg/dL
Specific Gravity, Urine: 1.008 (ref 1.005–1.030)
pH: 5 (ref 5.0–8.0)

## 2023-01-13 LAB — BASIC METABOLIC PANEL
Anion gap: 12 (ref 5–15)
BUN: 14 mg/dL (ref 6–20)
CO2: 24 mmol/L (ref 22–32)
Calcium: 9.2 mg/dL (ref 8.9–10.3)
Chloride: 100 mmol/L (ref 98–111)
Creatinine, Ser: 1.25 mg/dL — ABNORMAL HIGH (ref 0.44–1.00)
GFR, Estimated: 53 mL/min — ABNORMAL LOW (ref >60)
Glucose, Bld: 94 mg/dL (ref 70–99)
Potassium: 3.3 mmol/L — ABNORMAL LOW (ref 3.5–5.1)
Sodium: 136 mmol/L (ref 135–145)

## 2023-01-13 MED ORDER — KETOROLAC TROMETHAMINE 15 MG/ML IJ SOLN
15.0000 mg | Freq: Once | INTRAMUSCULAR | Status: AC
  Administered 2023-01-13: 15 mg via INTRAVENOUS
  Filled 2023-01-13: qty 1

## 2023-01-13 MED ORDER — ONDANSETRON 4 MG PO TBDP: 4.0000 mg | ORAL_TABLET | Freq: Three times a day (TID) | ORAL | 0 refills | Status: AC | PRN

## 2023-01-13 MED ORDER — CEFDINIR 300 MG PO CAPS: 300.0000 mg | ORAL_CAPSULE | Freq: Two times a day (BID) | ORAL | 0 refills | Status: AC

## 2023-01-13 MED ORDER — SODIUM CHLORIDE 0.9 % IV SOLN
2.0000 g | Freq: Once | INTRAVENOUS | Status: AC
  Administered 2023-01-13: 2 g via INTRAVENOUS
  Filled 2023-01-13: qty 20

## 2023-01-13 MED ORDER — SODIUM CHLORIDE 0.9 % IV BOLUS
1000.0000 mL | Freq: Once | INTRAVENOUS | Status: AC
  Administered 2023-01-13: 1000 mL via INTRAVENOUS

## 2023-01-13 MED ORDER — HYDROCODONE-ACETAMINOPHEN 5-325 MG PO TABS: 1.0000 | ORAL_TABLET | Freq: Four times a day (QID) | ORAL | 0 refills | Status: AC | PRN

## 2023-01-13 NOTE — ED Notes (Signed)
Pt verbalizes understanding of discharge instructions. Opportunity for questioning and answers were provided. Pt discharged from ED to home.   ? ?

## 2023-01-13 NOTE — Discharge Instructions (Signed)
You can take Omnicef twice daily for the next 7 days. You can take Norco every 8 hours as needed for flank pain. If symptoms worsen at home, please return for evaluation. Signs of worsening symptoms would be worsening back pain, fever or vomiting at home.

## 2023-01-13 NOTE — ED Triage Notes (Signed)
Pt here with lower back pain and a fever that started yesterday. Pt states she slept all day yesterday and this morning had trouble walking d/t her back pain. Pt took ibuprofen at 0900.

## 2023-01-13 NOTE — ED Provider Notes (Signed)
[facility] Provider Note  Patient Contact: 3:25 PM (approximate)   History   Back Pain   HPI  Brittany Herrera is a 52 y.o. female with a history of kidney stones and anemia, presents to the emergency department with bilateral flank pain for the past 2 days.  Patient states that she developed a fever yesterday and states that she slept all day which was atypical for her.  She states that she is prone to both kidney stones and pyelonephritis.  She denies chest pain, chest tightness or shortness of breath.  No vomiting or abdominal pain.  Patient denies bowel or bladder incontinence or saddle anesthesia.  No radiation of pain to the legs.      Physical Exam   Triage Vital Signs: ED Triage Vitals [01/13/23 1325]  Enc Vitals Group     BP (!) 139/100     Pulse Rate 95     Resp 18     Temp 98.8 F (37.1 C)     Temp Source Oral     SpO2 100 %     Weight 134 lb 0.6 oz (60.8 kg)     Height 5\' 3"  (1.6 m)     Head Circumference      Peak Flow      Pain Score 8     Pain Loc      Pain Edu?      Excl. in GC?     Most recent vital signs: Vitals:   01/13/23 1600 01/13/23 1700  BP: 120/85 132/81  Pulse: 74 61  Resp: 18 18  Temp:    SpO2: 100% 97%     General: Alert and in no acute distress. Eyes:  PERRL. EOMI. Head: No acute traumatic findings ENT:      Nose: No congestion/rhinnorhea.      Mouth/Throat: Mucous membranes are moist. Neck: No stridor. No cervical spine tenderness to palpation. Cardiovascular:  Good peripheral perfusion Respiratory: Normal respiratory effort without tachypnea or retractions. Lungs CTAB. Good air entry to the bases with no decreased or absent breath sounds. Gastrointestinal: Bowel sounds 4 quadrants. Soft and nontender to palpation. No guarding or rigidity. No palpable masses. No distention. Patient has CVA tenderness. Musculoskeletal: Full range of motion to all extremities.  Neurologic:  No gross focal neurologic  deficits are appreciated.  Skin:   No rash noted Other:   ED Results / Procedures / Treatments   Labs (all labs ordered are listed, but only abnormal results are displayed) Labs Reviewed  CBC - Abnormal; Notable for the following components:      Result Value   Hemoglobin 15.4 (*)    All other components within normal limits  BASIC METABOLIC PANEL - Abnormal; Notable for the following components:   Potassium 3.3 (*)    Creatinine, Ser 1.25 (*)    GFR, Estimated 53 (*)    All other components within normal limits  URINALYSIS, ROUTINE W REFLEX MICROSCOPIC - Abnormal; Notable for the following components:   Color, Urine YELLOW (*)    APPearance CLEAR (*)    Leukocytes,Ua TRACE (*)    Bacteria, UA MANY (*)    All other components within normal limits        RADIOLOGY  I personally viewed and evaluated these images as part of my medical decision making, as well as reviewing the written report by the radiologist.  ED Provider Interpretation: No evidence of renal stone.   PROCEDURES:  Critical Care performed: No  Procedures   MEDICATIONS ORDERED IN ED: Medications  cefTRIAXone (ROCEPHIN) 2 g in sodium chloride 0.9 % 100 mL IVPB (0 g Intravenous Stopped 01/13/23 1626)  sodium chloride 0.9 % bolus 1,000 mL (1,000 mLs Intravenous New Bag/Given 01/13/23 1627)  ketorolac (TORADOL) 15 MG/ML injection 15 mg (15 mg Intravenous Given 01/13/23 1640)     IMPRESSION / MDM / ASSESSMENT AND PLAN / ED COURSE  I reviewed the triage vital signs and the nursing notes.                              Assessment and plan Flank pain 52 year old female presents to the ED emergency department with bilateral flank pain.  Patient was hypertensive at triage but vital signs were otherwise reassuring.  On exam, patient was alert and nontoxic-appearing.  She did have bilateral CVA tenderness.  Differential diagnosis includes nephrolithiasis, septic renal stone, pyelonephritis, UTI...   CT  renal stone study unremarkable.  Patient was given 2 g of Rocephin, normal saline bolus and IV Toradol.  After aforementioned interventions, patient stated that she felt significantly improved.  Patient was discharged with Omnicef twice daily for the next 7 days.  Urine culture is in process.  Return precautions were given to return with new or worsening symptoms.  All patient questions were answered  FINAL CLINICAL IMPRESSION(S) / ED DIAGNOSES   Final diagnoses:  Pyelonephritis     Rx / DC Orders   ED Discharge Orders          Ordered    cefdinir (OMNICEF) 300 MG capsule  2 times daily        01/13/23 1722    HYDROcodone-acetaminophen (NORCO) 5-325 MG tablet  Every 6 hours PRN        01/13/23 1722    ondansetron (ZOFRAN-ODT) 4 MG disintegrating tablet  Every 8 hours PRN        01/13/23 1722             Note:  This document was prepared using Dragon voice recognition software and may include unintentional dictation errors.   Pia Mau Conception, PA-C 01/13/23 1726    Sharman Cheek, MD 01/13/23 [phone]

## 2024-05-14 ENCOUNTER — Emergency Department

## 2024-05-14 ENCOUNTER — Emergency Department
Admission: EM | Admit: 2024-05-14 | Discharge: 2024-05-14 | Disposition: A | Discharge status: Home / Self Care | Attending: Emergency Medicine | Admitting: Emergency Medicine

## 2024-05-14 ENCOUNTER — Other Ambulatory Visit: Payer: Self-pay

## 2024-05-14 DIAGNOSIS — R109 Unspecified abdominal pain: Secondary | ICD-10-CM | POA: Diagnosis not present

## 2024-05-14 DIAGNOSIS — R112 Nausea with vomiting, unspecified: Secondary | ICD-10-CM | POA: Diagnosis not present

## 2024-05-14 DIAGNOSIS — N12 Tubulo-interstitial nephritis, not specified as acute or chronic: Secondary | ICD-10-CM | POA: Insufficient documentation

## 2024-05-14 DIAGNOSIS — N200 Calculus of kidney: Secondary | ICD-10-CM | POA: Diagnosis not present

## 2024-05-14 LAB — URINALYSIS, ROUTINE W REFLEX MICROSCOPIC
Bilirubin Urine: NEGATIVE
Glucose, UA: NEGATIVE mg/dL
Hgb urine dipstick: NEGATIVE
Ketones, ur: NEGATIVE mg/dL
Nitrite: NEGATIVE
Protein, ur: NEGATIVE mg/dL
Specific Gravity, Urine: 1.02 (ref 1.005–1.030)
WBC, UA: >50 WBC/hpf (ref 0–5)
pH: 5 (ref 5.0–8.0)

## 2024-05-14 LAB — LIPASE, BLOOD: Lipase: 42 U/L (ref 11–51)

## 2024-05-14 LAB — COMPREHENSIVE METABOLIC PANEL WITH GFR
ALT: 19 U/L (ref 0–44)
AST: 18 U/L (ref 15–41)
Albumin: 3.2 g/dL — ABNORMAL LOW (ref 3.5–5.0)
Alkaline Phosphatase: 108 U/L (ref 38–126)
Anion gap: 8 (ref 5–15)
BUN: 16 mg/dL (ref 6–20)
CO2: 25 mmol/L (ref 22–32)
Calcium: 8.8 mg/dL — ABNORMAL LOW (ref 8.9–10.3)
Chloride: 107 mmol/L (ref 98–111)
Creatinine, Ser: 0.91 mg/dL (ref 0.44–1.00)
GFR, Estimated: >60 mL/min (ref >60)
Glucose, Bld: 124 mg/dL — ABNORMAL HIGH (ref 70–99)
Potassium: 3.9 mmol/L (ref 3.5–5.1)
Sodium: 140 mmol/L (ref 135–145)
Total Bilirubin: 0.7 mg/dL (ref 0.0–1.2)
Total Protein: 6.6 g/dL (ref 6.5–8.1)

## 2024-05-14 LAB — CBC
HCT: 41.6 % (ref 36.0–46.0)
Hemoglobin: 13.7 g/dL (ref 12.0–15.0)
MCH: 32.9 pg (ref 26.0–34.0)
MCHC: 32.9 g/dL (ref 30.0–36.0)
MCV: 99.8 fL (ref 80.0–100.0)
Platelets: 445 K/uL — ABNORMAL HIGH (ref 150–400)
RBC: 4.17 MIL/uL (ref 3.87–5.11)
RDW: 13.5 % (ref 11.5–15.5)
WBC: 18.4 K/uL — ABNORMAL HIGH (ref 4.0–10.5)
nRBC: 0 % (ref 0.0–0.2)

## 2024-05-14 MED ORDER — OXYCODONE-ACETAMINOPHEN 5-325 MG PO TABS: 1.0000 | ORAL_TABLET | ORAL | 0 refills | Status: AC | PRN

## 2024-05-14 MED ORDER — ONDANSETRON 4 MG PO TBDP: 4.0000 mg | ORAL_TABLET | Freq: Three times a day (TID) | ORAL | 0 refills | Status: AC | PRN

## 2024-05-14 MED ORDER — LACTATED RINGERS IV BOLUS
1000.0000 mL | Freq: Once | INTRAVENOUS | Status: AC
  Administered 2024-05-14: 1000 mL via INTRAVENOUS

## 2024-05-14 MED ORDER — SODIUM CHLORIDE 0.9 % IV SOLN
1.0000 g | Freq: Once | INTRAVENOUS | Status: AC
  Administered 2024-05-14: 1 g via INTRAVENOUS
  Filled 2024-05-14: qty 10

## 2024-05-14 MED ORDER — CEFPODOXIME PROXETIL 200 MG PO TABS: 200.0000 mg | ORAL_TABLET | Freq: Two times a day (BID) | ORAL | 0 refills | Status: AC

## 2024-05-14 MED ORDER — ONDANSETRON HCL 4 MG/2ML IJ SOLN
4.0000 mg | Freq: Once | INTRAMUSCULAR | Status: AC
  Administered 2024-05-14: 4 mg via INTRAVENOUS
  Filled 2024-05-14: qty 2

## 2024-05-14 MED ORDER — MORPHINE SULFATE (PF) 4 MG/ML IV SOLN
4.0000 mg | Freq: Once | INTRAVENOUS | Status: AC
  Administered 2024-05-14: 4 mg via INTRAVENOUS
  Filled 2024-05-14: qty 1

## 2024-05-14 NOTE — ED Triage Notes (Signed)
 Pt to ED via POV from home. Pt reports LLQ pain w/ radiation to her back and N/V/D that started this morning at 0300. Pt reports increased urinary sensation.   Hx kidney stones

## 2024-05-14 NOTE — ED Provider Notes (Signed)
 [facility] Provider Note    Event Date/Time   First MD Initiated Contact with Patient 05/14/24 0801     (approximate)   History   Chief Complaint Abdominal Pain   HPI  Brittany Herrera is a 53 y.o. female with past medical history of anemia, dysfunctional uterine bleeding, and kidney stones who presents to the ED complaining of abdominal pain.  Patient reports that she has been dealing with increasing pain extending from her left flank into the left lower quadrant of her abdomen since 3 AM this morning.  This has been associated with nausea and vomiting as well as subjective fevers and chills.  She endorses urinary frequency over the past couple of days but denies any dysuria.  She has not had any changes in her bowel movements, describes symptoms as similar to prior kidney stones.  She denies any vaginal bleeding or discharge.     Physical Exam   Triage Vital Signs: ED Triage Vitals  Encounter Vitals Group     BP 05/14/24 0736 (!) 145/102     Girls Systolic BP Percentile --      Girls Diastolic BP Percentile --      Boys Systolic BP Percentile --      Boys Diastolic BP Percentile --      Pulse Rate 05/14/24 0736 88     Resp 05/14/24 0736 18     Temp 05/14/24 0736 98.4 F (36.9 C)     Temp Source 05/14/24 0736 Oral     SpO2 05/14/24 0736 98 %     Weight --      Height --      Head Circumference --      Peak Flow --      Pain Score 05/14/24 0737 9     Pain Loc --      Pain Education --      Exclude from Growth Chart --     Most recent vital signs: Vitals:   05/14/24 0736  BP: (!) 145/102  Pulse: 88  Resp: 18  Temp: 98.4 F (36.9 C)  SpO2: 98%    Constitutional: Alert and oriented. Eyes: Conjunctivae are normal. Head: Atraumatic. Nose: No congestion/rhinnorhea. Mouth/Throat: Mucous membranes are moist.  Cardiovascular: Normal rate, regular rhythm. Grossly normal heart sounds.  2+ radial pulses bilaterally. Respiratory: Normal  respiratory effort.  No retractions. Lungs CTAB. Gastrointestinal: Soft and tender to palpation in the left lower quadrant with no rebound or guarding.  Left CVA tenderness noted. No distention. Musculoskeletal: No lower extremity tenderness nor edema.  Neurologic:  Normal speech and language. No gross focal neurologic deficits are appreciated.    ED Results / Procedures / Treatments   Labs (all labs ordered are listed, but only abnormal results are displayed) Labs Reviewed  COMPREHENSIVE METABOLIC PANEL WITH GFR - Abnormal; Notable for the following components:      Result Value   Glucose, Bld 124 (*)    Calcium 8.8 (*)    Albumin 3.2 (*)    All other components within normal limits  CBC - Abnormal; Notable for the following components:   WBC 18.4 (*)    Platelets 445 (*)    All other components within normal limits  URINALYSIS, ROUTINE W REFLEX MICROSCOPIC - Abnormal; Notable for the following components:   Color, Urine YELLOW (*)    APPearance CLOUDY (*)    Leukocytes,Ua MODERATE (*)    Bacteria, UA FEW (*)    All other components  within normal limits  URINE CULTURE  LIPASE, BLOOD    RADIOLOGY CT renal protocol reviewed and interpreted by me with inflammatory changes around the left kidney, no hydronephrosis noted.  PROCEDURES:  Critical Care performed: No  Procedures   MEDICATIONS ORDERED IN ED: Medications  morphine  (PF) 4 MG/ML injection 4 mg (4 mg Intravenous Given 05/14/24 0914)  ondansetron  (ZOFRAN ) injection 4 mg (4 mg Intravenous Given 05/14/24 0912)  lactated ringers  bolus 1,000 mL (1,000 mLs Intravenous New Bag/Given 05/14/24 0955)  cefTRIAXone  (ROCEPHIN ) 1 g in sodium chloride  0.9 % 100 mL IVPB (0 g Intravenous Stopped 05/14/24 0951)     IMPRESSION / MDM / ASSESSMENT AND PLAN / ED COURSE  I reviewed the triage vital signs and the nursing notes.                              53 y.o. female with past medical history of kidney stones, anemia, and  dysfunctional uterine bleeding who presents to the ED complaining of left lower quadrant abdominal pain and left flank pain over the past 5 hours with few days of preceding urinary frequency.  Patient's presentation is most consistent with acute presentation with potential threat to life or bodily function.  Differential diagnosis includes, but is not limited to, kidney stone, pyelonephritis, diverticulitis, gastritis, GERD, gastroenteritis, dehydration, electrolyte abnormality, AKI.  Patient nontoxic-appearing and in no acute distress, vital signs are unremarkable.  Her abdomen is soft but she has tenderness to palpation in the left lower quadrant as well as her left costovertebral area.  We will further assess with CT imaging for potential kidney stone, urinalysis does appear concerning for infection and we will send for culture.  Labs without significant electrolyte abnormality or AKI, LFTs and lipase are unremarkable.  CT imaging consistent with emphysematous pyelonephritis.  Patient does have stone in the left renal pelvis as well as the bladder, but imaging was reviewed with Dr. Twylla of urology, who states that there is no indication for stenting at this time.  Patient was offered admission for IV antibiotics, prefers to go home, which is reasonable given there are no signs of sepsis.  She is tolerating oral intake without difficulty, previous culture grew pansensitive E. coli and we will treat with cefpodoxime .  She was counseled to return to the ED for new or worsening symptoms, patient agrees with plan.      FINAL CLINICAL IMPRESSION(S) / ED DIAGNOSES   Final diagnoses:  Pyelonephritis     Rx / DC Orders   ED Discharge Orders          Ordered    ondansetron  (ZOFRAN -ODT) 4 MG disintegrating tablet  Every 8 hours PRN        05/14/24 0958    cefpodoxime  (VANTIN ) 200 MG tablet  2 times daily        05/14/24 0958    oxyCODONE -acetaminophen  (PERCOCET) 5-325 MG tablet  Every 4 hours  PRN        05/14/24 9041             Note:  This document was prepared using Dragon voice recognition software and may include unintentional dictation errors.   Willo Dunnings, MD 05/14/24 [phone]

## 2024-05-16 LAB — URINE CULTURE: Culture: >=100000 — AB
